# Patient Record
Sex: Male | Born: 1950 | Race: White | Hispanic: No | Marital: Married | Smoking: Current every day smoker
Health system: Southern US, Community
[De-identification: ages and names within clinical notes are randomized; demographics above are authoritative.]

## PROBLEM LIST (undated history)

## (undated) DIAGNOSIS — F419 Anxiety disorder, unspecified: Secondary | ICD-10-CM

## (undated) DIAGNOSIS — R269 Unspecified abnormalities of gait and mobility: Secondary | ICD-10-CM

## (undated) DIAGNOSIS — M8580 Other specified disorders of bone density and structure, unspecified site: Secondary | ICD-10-CM

## (undated) DIAGNOSIS — G90522 Complex regional pain syndrome I of left lower limb: Secondary | ICD-10-CM

## (undated) DIAGNOSIS — R413 Other amnesia: Secondary | ICD-10-CM

## (undated) DIAGNOSIS — J449 Chronic obstructive pulmonary disease, unspecified: Secondary | ICD-10-CM

## (undated) DIAGNOSIS — Z87442 Personal history of urinary calculi: Secondary | ICD-10-CM

## (undated) DIAGNOSIS — F1010 Alcohol abuse, uncomplicated: Secondary | ICD-10-CM

## (undated) DIAGNOSIS — I10 Essential (primary) hypertension: Secondary | ICD-10-CM

## (undated) DIAGNOSIS — M1990 Unspecified osteoarthritis, unspecified site: Secondary | ICD-10-CM

## (undated) DIAGNOSIS — S42301A Unspecified fracture of shaft of humerus, right arm, initial encounter for closed fracture: Secondary | ICD-10-CM

## (undated) DIAGNOSIS — R0600 Dyspnea, unspecified: Secondary | ICD-10-CM

## (undated) DIAGNOSIS — G629 Polyneuropathy, unspecified: Secondary | ICD-10-CM

## (undated) DIAGNOSIS — D132 Benign neoplasm of duodenum: Secondary | ICD-10-CM

## (undated) DIAGNOSIS — K859 Acute pancreatitis without necrosis or infection, unspecified: Secondary | ICD-10-CM

## (undated) DIAGNOSIS — G250 Essential tremor: Secondary | ICD-10-CM

## (undated) DIAGNOSIS — R569 Unspecified convulsions: Secondary | ICD-10-CM

## (undated) DIAGNOSIS — T4145XA Adverse effect of unspecified anesthetic, initial encounter: Secondary | ICD-10-CM

## (undated) HISTORY — PX: FOOT SURGERY: SHX648

## (undated) HISTORY — DX: Unspecified abnormalities of gait and mobility: R26.9

## (undated) HISTORY — PX: VASECTOMY: SHX75

## (undated) HISTORY — DX: Essential (primary) hypertension: I10

## (undated) HISTORY — PX: BUNIONECTOMY: SHX129

## (undated) HISTORY — DX: Complex regional pain syndrome i of left lower limb: G90.522

## (undated) HISTORY — PX: OTHER SURGICAL HISTORY: SHX169

## (undated) HISTORY — DX: Unspecified fracture of shaft of humerus, right arm, initial encounter for closed fracture: S42.301A

## (undated) HISTORY — DX: Other amnesia: R41.3

## (undated) HISTORY — PX: LUMBAR SPINE SURGERY: SHX701

## (undated) HISTORY — PX: SHOULDER SURGERY: SHX246

## (undated) HISTORY — DX: Polyneuropathy, unspecified: G62.9

## (undated) HISTORY — DX: Essential tremor: G25.0

## (undated) HISTORY — DX: Alcohol abuse, uncomplicated: F10.10

## (undated) HISTORY — PX: TONSILLECTOMY: SUR1361

## (undated) HISTORY — DX: Unspecified convulsions: R56.9

## (undated) HISTORY — DX: Other specified disorders of bone density and structure, unspecified site: M85.80

---

## 1981-07-11 DIAGNOSIS — R569 Unspecified convulsions: Secondary | ICD-10-CM

## 1981-07-11 HISTORY — DX: Unspecified convulsions: R56.9

## 1998-08-13 ENCOUNTER — Ambulatory Visit (HOSPITAL_COMMUNITY): Admission: RE | Admit: 1998-08-13 | Discharge: 1998-08-13 | Payer: Self-pay | Admitting: Gastroenterology

## 1999-11-15 ENCOUNTER — Other Ambulatory Visit: Admission: RE | Admit: 1999-11-15 | Discharge: 1999-11-15 | Payer: Self-pay

## 2000-04-26 ENCOUNTER — Encounter: Admission: RE | Admit: 2000-04-26 | Discharge: 2000-04-26 | Payer: Self-pay | Admitting: Orthopedic Surgery

## 2000-04-26 ENCOUNTER — Encounter: Payer: Self-pay | Admitting: Orthopedic Surgery

## 2001-08-07 ENCOUNTER — Encounter: Payer: Self-pay | Admitting: Emergency Medicine

## 2001-08-07 ENCOUNTER — Emergency Department (HOSPITAL_COMMUNITY): Admission: EM | Admit: 2001-08-07 | Discharge: 2001-08-07 | Payer: Self-pay | Admitting: Emergency Medicine

## 2002-12-02 ENCOUNTER — Encounter: Payer: Self-pay | Admitting: Emergency Medicine

## 2002-12-02 ENCOUNTER — Emergency Department (HOSPITAL_COMMUNITY): Admission: EM | Admit: 2002-12-02 | Discharge: 2002-12-02 | Payer: Self-pay | Admitting: Emergency Medicine

## 2004-04-12 ENCOUNTER — Ambulatory Visit (HOSPITAL_COMMUNITY): Admission: RE | Admit: 2004-04-12 | Discharge: 2004-04-12 | Payer: Self-pay | Admitting: Gastroenterology

## 2004-09-04 ENCOUNTER — Inpatient Hospital Stay (HOSPITAL_COMMUNITY): Admission: AC | Admit: 2004-09-04 | Discharge: 2004-09-06 | Payer: Self-pay | Admitting: Emergency Medicine

## 2004-09-17 ENCOUNTER — Ambulatory Visit (HOSPITAL_COMMUNITY): Admission: RE | Admit: 2004-09-17 | Discharge: 2004-09-18 | Payer: Self-pay | Admitting: Orthopedic Surgery

## 2004-10-26 ENCOUNTER — Ambulatory Visit: Payer: Self-pay | Admitting: Psychiatry

## 2004-10-26 ENCOUNTER — Other Ambulatory Visit (HOSPITAL_COMMUNITY): Admission: RE | Admit: 2004-10-26 | Discharge: 2005-01-24 | Payer: Self-pay | Admitting: Psychiatry

## 2007-05-31 ENCOUNTER — Encounter: Admission: RE | Admit: 2007-05-31 | Discharge: 2007-05-31 | Payer: Self-pay | Admitting: Orthopedic Surgery

## 2007-07-18 ENCOUNTER — Ambulatory Visit (HOSPITAL_COMMUNITY): Admission: RE | Admit: 2007-07-18 | Discharge: 2007-07-18 | Payer: Self-pay | Admitting: Urology

## 2007-07-18 ENCOUNTER — Encounter (INDEPENDENT_AMBULATORY_CARE_PROVIDER_SITE_OTHER): Payer: Self-pay | Admitting: Urology

## 2009-05-01 ENCOUNTER — Ambulatory Visit: Payer: Self-pay | Admitting: Vascular Surgery

## 2009-06-11 ENCOUNTER — Ambulatory Visit: Payer: Self-pay | Admitting: Vascular Surgery

## 2009-07-31 DIAGNOSIS — E785 Hyperlipidemia, unspecified: Secondary | ICD-10-CM | POA: Insufficient documentation

## 2009-07-31 DIAGNOSIS — Z8669 Personal history of other diseases of the nervous system and sense organs: Secondary | ICD-10-CM | POA: Insufficient documentation

## 2009-07-31 DIAGNOSIS — Z860100 Personal history of colon polyps, unspecified: Secondary | ICD-10-CM | POA: Insufficient documentation

## 2009-07-31 DIAGNOSIS — I10 Essential (primary) hypertension: Secondary | ICD-10-CM | POA: Insufficient documentation

## 2009-07-31 DIAGNOSIS — Z8719 Personal history of other diseases of the digestive system: Secondary | ICD-10-CM | POA: Insufficient documentation

## 2009-07-31 DIAGNOSIS — E7210 Disorders of sulfur-bearing amino-acid metabolism, unspecified: Secondary | ICD-10-CM | POA: Insufficient documentation

## 2009-07-31 DIAGNOSIS — Z87448 Personal history of other diseases of urinary system: Secondary | ICD-10-CM | POA: Insufficient documentation

## 2009-07-31 HISTORY — DX: Essential (primary) hypertension: I10

## 2009-08-03 ENCOUNTER — Ambulatory Visit: Payer: Self-pay | Admitting: Pulmonary Disease

## 2009-08-03 DIAGNOSIS — R05 Cough: Secondary | ICD-10-CM

## 2009-08-03 DIAGNOSIS — R059 Cough, unspecified: Secondary | ICD-10-CM | POA: Insufficient documentation

## 2009-08-03 DIAGNOSIS — J449 Chronic obstructive pulmonary disease, unspecified: Secondary | ICD-10-CM | POA: Insufficient documentation

## 2009-08-03 DIAGNOSIS — J4489 Other specified chronic obstructive pulmonary disease: Secondary | ICD-10-CM | POA: Insufficient documentation

## 2010-06-18 ENCOUNTER — Encounter
Admission: RE | Admit: 2010-06-18 | Discharge: 2010-06-18 | Payer: Self-pay | Source: Home / Self Care | Attending: Neurology | Admitting: Neurology

## 2010-06-18 ENCOUNTER — Encounter
Admission: RE | Admit: 2010-06-18 | Discharge: 2010-06-18 | Payer: Self-pay | Source: Home / Self Care | Attending: Physical Medicine and Rehabilitation | Admitting: Physical Medicine and Rehabilitation

## 2010-08-10 NOTE — Assessment & Plan Note (Signed)
Summary: chronic cough/apc   Visit Type:  Initial Consult Copy to:  Dr. Barbaraann Barthel Primary Provider/Referring Provider:  Dr. [name]  CC:  Pt here for pulmonary consult. Pt c/o productive cough intermittent starting September after travels to [place].  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 He has been coughing since his return from a trip to [place] in 9/10, onset with  a URI. Lisinopril was stopped in 05/27/09 & received azithro, prednisone dosepak in 12/10. CXR 12/10 was nml, PF was 385- 430 (post neb), CT chets 11/08 reviewed -nml. Spiromertry did not show evidence of ariflow obstruction. He reports constant post nasal drainage. This is mostly a wet cough, as against his constant / smoker's cough which is dry, with clear phlegm, worse in am but not much variation with time or exposure. Environmental history >> no pet alergens or  recent chnage in surroundings. Denies heartburn or childhood h/o asthma Also c/o chronic fatigue , snoring +, no witnessed apneas, denies excessive daytime somnolence    Preventive Screening-Counseling & Management  Alcohol-Tobacco     Alcohol drinks/day: 1     Alcohol type: all     Smoking Status: current     Packs/Day: 1.0     Year Started: 1965  Current Medications (verified): 1)  Dilantin 100 Mg Caps (Phenytoin Sodium Extended) .... 500mg  T, Th, Sat, Sun and 400mg  M, W, F 2)  Viagra 100 Mg Tabs (Sildenafil Citrate) .... Take 1 Tab By Mouth 30 Minutes Prior To Intercourse Once Daily As Needed 3)  Lipitor 10 Mg Tabs (Atorvastatin Calcium) .... Take 1 Tablet By Mouth Once A Day 4)  Metanx 3-35-2 Mg Tabs (L-Methylfolate-B6-B12) .... Take 1 Tablet By Mouth Once A Day 5)  Hyzaar 50-12.5 Mg Tabs (Losartan Potassium-Hctz) .... Take 1 Tablet By Mouth Once A Day 6)  Proventil Hfa 108 (90 Base) Mcg/act Aers (Albuterol Sulfate) .... 2 Puffs As Needed Every 4-6 Hours As Needed For Wheezing 7)  Advair  Diskus 100-50 Mcg/dose Aepb (Fluticasone-Salmeterol) .... Inhale 1 Puff Two Times A Day  Allergies (verified): 1)  ! Diovan 2)  ! 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  Smoking Status:  current Packs/Day:  1.0 Alcohol drinks/day:  1  Review of Systems       The patient complains of shortness of breath with activity, productive cough, non-productive cough, acid heartburn, indigestion, loss of appetite, tooth/dental problems, nasal congestion/difficulty breathing through nose, and sneezing.  The patient denies shortness of breath at rest, coughing up blood, chest pain, irregular heartbeats, weight change, abdominal pain, difficulty swallowing, sore throat, headaches, itching, ear ache, anxiety, depression, hand/feet swelling, joint stiffness or pain, rash, change in color of mucus, and fever.    Vital Signs:  Patient profile:   60 year old male Height:      68 inches Weight:      164.13 pounds BMI:     25.05 O2 Sat:      95 %  on Room air Temp:     98.2 degrees F oral Pulse rate:   88 / minute BP sitting:   150 / 82  (left arm) Cuff size:   regular  Vitals Entered By: Zackery Barefoot CMA (August 03, 2009 2:59 PM)  O2 Flow:  Room air CC: Pt here for pulmonary consult. Pt c/o productive cough intermittent starting September after travels to  [place] Comments Medications reviewed with patient Verified pt's contact number Zackery Barefoot CMA  August 03, 2009 2:59 PM    Physical Exam  Additional Exam:  Gen. Pleasant, well-nourished, in no distress, normal affect ENT - no lesions, no post nasal drip Neck: No JVD, no thyromegaly, no carotid bruits Lungs: no use of accessory muscles, no dullness to percussion, decreased  without rales or rhonchi  Cardiovascular: Rhythm regular, heart sounds  normal, no murmurs or gallops, no peripheral edema Abdomen: soft and non-tender, no hepatosplenomegaly, BS normal. Musculoskeletal: No deformities, no cyanosis or clubbing Neuro:  alert, non focal     Pulmonary Function Test Date: 08/03/2009 3:42 PM Gender: Male  Pre-Spirometry FVC    Value: 4.15 L/min   % Pred: 94.60 % FEV1    Value: 2.98 L     Pred: 3.32 L     % Pred: 89.70 % FEV1/FVC  Value: 71.77 %     % Pred: 94.80 %  Impression & Recommendations:  Problem # 1:  COUGH (ICD-786.2)  Use symbicort 160/4.5 2 puffs two times a day  - stop advair. Likley post bronchitic or related to post nasal drip - use flonase & claritin. If persistent, will Rx symptomatically with tessalon.  Orders: Consultation Level III (16109) Spirometry w/Graph (94010)  Problem # 2:  C O P D (ICD-496) Surprisingly , lung function is preserved but obviosuly smoking cessation paramount here. he is convinced of the need to quit but has reneged on his quit date several times.  Medications Added to Medication List This Visit: 1)  Advair Diskus 100-50 Mcg/dose Aepb (Fluticasone-salmeterol) .... Inhale 1 puff two times a day  Patient Instructions: 1)  Copy sent to: DrKevin  Little 2)  Please schedule a follow-up appointment in 3 months. 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

## 2010-08-11 HISTORY — PX: CERVICAL SPINE SURGERY: SHX589

## 2010-08-13 ENCOUNTER — Other Ambulatory Visit (HOSPITAL_COMMUNITY): Payer: Self-pay | Admitting: Neurosurgery

## 2010-08-13 ENCOUNTER — Other Ambulatory Visit: Payer: Self-pay | Admitting: Neurosurgery

## 2010-08-13 ENCOUNTER — Encounter (HOSPITAL_COMMUNITY): Payer: 59

## 2010-08-13 ENCOUNTER — Encounter (HOSPITAL_COMMUNITY)
Admission: RE | Admit: 2010-08-13 | Discharge: 2010-08-13 | Disposition: A | Discharge status: Home / Self Care | Payer: 59 | Source: Ambulatory Visit | Attending: Neurosurgery | Admitting: Neurosurgery

## 2010-08-13 DIAGNOSIS — R52 Pain, unspecified: Secondary | ICD-10-CM

## 2010-08-13 DIAGNOSIS — Z87891 Personal history of nicotine dependence: Secondary | ICD-10-CM | POA: Insufficient documentation

## 2010-08-13 DIAGNOSIS — Z01818 Encounter for other preprocedural examination: Secondary | ICD-10-CM | POA: Insufficient documentation

## 2010-08-13 DIAGNOSIS — I10 Essential (primary) hypertension: Secondary | ICD-10-CM | POA: Insufficient documentation

## 2010-08-13 LAB — SURGICAL PCR SCREEN
MRSA, PCR: NEGATIVE
Staphylococcus aureus: NEGATIVE

## 2010-08-13 LAB — BASIC METABOLIC PANEL
BUN: 8 mg/dL (ref 6–23)
CO2: 30 mEq/L (ref 19–32)
Calcium: 9.4 mg/dL (ref 8.4–10.5)
Chloride: 102 mEq/L (ref 96–112)
Creatinine, Ser: 0.86 mg/dL (ref 0.4–1.5)
GFR calc Af Amer: >60 mL/min (ref >60)
GFR calc non Af Amer: >60 mL/min (ref >60)
Glucose, Bld: 75 mg/dL (ref 70–99)
Potassium: 4 mEq/L (ref 3.5–5.1)
Sodium: 140 mEq/L (ref 135–145)

## 2010-08-13 LAB — CBC
HCT: 41.2 % (ref 39.0–52.0)
Hemoglobin: 14.1 g/dL (ref 13.0–17.0)
MCH: 36.4 pg — ABNORMAL HIGH (ref 26.0–34.0)
MCHC: 34.2 g/dL (ref 30.0–36.0)
MCV: 106.5 fL — ABNORMAL HIGH (ref 78.0–100.0)
Platelets: 294 10*3/uL (ref 150–400)
RBC: 3.87 MIL/uL — ABNORMAL LOW (ref 4.22–5.81)
RDW: 13.9 % (ref 11.5–15.5)
WBC: 8.9 10*3/uL (ref 4.0–10.5)

## 2010-08-18 ENCOUNTER — Observation Stay (HOSPITAL_COMMUNITY)
Admission: RE | Admit: 2010-08-18 | Discharge: 2010-08-19 | Disposition: A | Discharge status: Home / Self Care | Payer: 59 | Source: Ambulatory Visit | Attending: Neurosurgery | Admitting: Neurosurgery

## 2010-08-18 ENCOUNTER — Inpatient Hospital Stay (HOSPITAL_COMMUNITY): Payer: 59

## 2010-08-18 DIAGNOSIS — G40909 Epilepsy, unspecified, not intractable, without status epilepticus: Secondary | ICD-10-CM | POA: Insufficient documentation

## 2010-08-18 DIAGNOSIS — I10 Essential (primary) hypertension: Secondary | ICD-10-CM | POA: Insufficient documentation

## 2010-08-18 DIAGNOSIS — Z87891 Personal history of nicotine dependence: Secondary | ICD-10-CM | POA: Insufficient documentation

## 2010-08-18 DIAGNOSIS — J449 Chronic obstructive pulmonary disease, unspecified: Secondary | ICD-10-CM | POA: Insufficient documentation

## 2010-08-18 DIAGNOSIS — Z01818 Encounter for other preprocedural examination: Secondary | ICD-10-CM | POA: Insufficient documentation

## 2010-08-18 DIAGNOSIS — M4712 Other spondylosis with myelopathy, cervical region: Principal | ICD-10-CM | POA: Insufficient documentation

## 2010-08-18 DIAGNOSIS — G8929 Other chronic pain: Secondary | ICD-10-CM | POA: Insufficient documentation

## 2010-08-18 DIAGNOSIS — J4489 Other specified chronic obstructive pulmonary disease: Secondary | ICD-10-CM | POA: Insufficient documentation

## 2010-08-18 DIAGNOSIS — F17200 Nicotine dependence, unspecified, uncomplicated: Secondary | ICD-10-CM | POA: Insufficient documentation

## 2010-08-23 NOTE — Op Note (Signed)
NAME:  Jeffrey Castillo, Jeffrey Castillo NO.:  [id number]  MEDICAL RECORD NO.:  [id number]           PATIENT TYPE:  I  LOCATION:  3534                         FACILITY:  [facility]  PHYSICIAN:  Cristi Loron, M.D.DATE OF BIRTH:  [birth year]  DATE OF PROCEDURE:  08/18/2010 DATE OF DISCHARGE:                              OPERATIVE REPORT   BRIEF HISTORY:  The patient is a 60 year old white male who has suffered from symptoms consistent with a cervical myelopathy.  He has failed medical management and worked up with a cervical MRI which demonstrated he had significant stenosis at C5-6 and C6-7.  I discussed the various treatment options with the patient including surgery.  The patient has weighed the risks, benefits, and alternatives of surgery, decided to proceed with a C5-6 and C6-7 anterior cervical diskectomy, fusion, and plating.  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  PROCEDURE:  C5-6 and C6-7 extensive anterior cervical diskectomy/decompression; C5-6 and C6-C7 anterior interbody arthrodesis with local morselized autograft bone and Actifuse bone graft extender; insertion of interbody prosthesis at C5-6 and C6-7 (Novel PEEK interbody prosthesis); anterior cervical plating C5-7 with Skyline titanium plate and screws.  SURGEON:  Cristi Loron, MD  ASSISTANT:  Danae Orleans. Venetia Maxon, MD  ANESTHESIA:  General endotracheal.  ESTIMATED BLOOD LOSS:  100 mL.  SPECIMENS:  None.  DRAINS:  None.  COMPLICATIONS:  None.  DESCRIPTION OF PROCEDURE:  The patient was brought to the operating room by the anesthesia team.  General endotracheal anesthesia was induced. The patient remained in supine position.  A roll was placed under his shoulders to keep his neck in the neutral position.  The  anterior cervical region was then prepared with Betadine scrub and Betadine solution.  Sterile drapes were applied.  I then injected the area to be incised with Marcaine with epinephrine solution, a scalpel to make a transverse incision in the patient's left anterior neck.  I used the Metzenbaum scissors to divide the platysma muscle and then to dissect medial to sternocleidomastoid muscle, jugular vein, and carotid artery. I carefully dissected down towards the anterior cervical spine identifying the esophagus and retracting it medially.  We then used Kittner swabs to clear soft tissue from the anterior cervical spine.  We then inserted a bent spinal needle into the upper exposed intervertebral disk space.  We obtained intraoperative radiograph to firm our location. We then used electrocautery to detach the medial border of the longus colli muscle bilaterally from the C5-6 and C6-7 intervertebral disk space.  We then inserted the Caspar self-retaining retractor underneath the longus colli muscle bilaterally to provide exposure.  We began the decompression by incising C5-6 intervertebral disk with a 15-blade scalpel.  The disk space was quite spondylotic and collapsed. We inserted distraction screws into C5-C6, distracted the interspace, and then used the high-speed drill to decorticate the vertebral endplates at C5-6, drilled away the remainder of C5-6 intervertebral disk and to thin out the posterior longitudinal ligament.  We also drilled away some posterior spondylosis.  We then incised  the ligament with the arachnoid knife and then removed it with Kerrison punch undercutting the vertebral endplates of C5-6 decompressing the thecal sac.  We then performed foraminotomies about the bilateral C6 nerve root completing the decompression at this level.  We then repeated this procedure in an analogous fashion at C6-7, decompressing the thecal sac and bilateral C7 nerve roots.  Having  completed the decompression, we now turned attention to the arthrodesis.  We used the trial spacers and determined to use a 7-mm prosthesis at C6-7 and 6-mm prosthesis at C5-6.  We prefilled the prosthesis with combination of local morselized autograft bone.  We obtained decompression as well as Actifuse bone graft extender.  We inserted the prosthesis and distracted interspaces.  We then removed the distraction screws.  There was a good snug fit of the prosthesis in the interspace.  We now turned attention to the anterior spinal instrumentation.  We used the high-speed drill to remove some ventral spondylosis from the vertebral endplates at C5-6 and C6-7 so the plate would lay down flat. We selected the appropriate length Skyline titanium plate.  We laid it along the anterior aspect of the vertebral bodies at C5 from C5 to C7. We then drilled two 14-mm holes at C5, 6, and 7.  We then secured the plate to the vertebral bodies by placing two 40-mm self-tapping screws at C5, 6, and 7.  We got good bony purchase at each level.  We then obtained intraoperative radiograph which demonstrated good position of the instrumentation with limited visualization of lower plate screws but they looked good in vivo.  We therefore secured these screws and plate by locking each cam.  This completed the instrumentation.  We then obtained hemostasis using bipolar electrocautery.  We irrigated the wound out with bacitracin solution and then removed the retractor. We inspected the esophagus for any damage, there was none apparent.  We then reapproximated the patient's platysma muscle with interrupted 3-0 Vicryl suture, subcutaneous tissue with interrupted 3-0 Vicryl suture, and skin with Steri-Strips and Benzoin.  The wound was then coated with bacitracin ointment and sterile dressing were applied.  The drapes were removed.  The patient was subsequently extubated by the anesthesia team and transported to the  postanesthesia care unit in stable condition. All sponge, instrument, and needle counts were correct at the end of this case.     Cristi Loron, M.D.     JDJ/MEDQ  D:  08/18/2010  T:  08/19/2010  Job:  [id number]  Electronically Signed by Tressie Stalker M.D. on 08/23/2010 10:52:22 AM

## 2010-09-23 ENCOUNTER — Other Ambulatory Visit: Payer: Self-pay | Admitting: Neurology

## 2010-09-23 DIAGNOSIS — Z79899 Other long term (current) drug therapy: Secondary | ICD-10-CM

## 2010-09-30 ENCOUNTER — Other Ambulatory Visit: Payer: 59

## 2010-11-23 NOTE — Op Note (Signed)
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   POSTOPERATIVE DIAGNOSIS:  Possible bladder cancer left posterior bladder  wall.   OPERATION:  TUR bladder tumor (2.5 cm) left posterior bladder wall.   ANESTHESIA:  General.   SURGEON:  Courtney Paris, M.D.   BRIEF HISTORY:  This 60 year old smoker has had some right back pain  November 2008.  He has had some previous stones and also microscopic  hematuria.  Negative cysto June 2005.  He did have some atypical cells  on cytology so cysto was done which showed a lesion that was  inflammatory just medial to the left ureteral orifice on the left  posterior bladder wall.  He enters to have this biopsied at this time.   PROCEDURE IN DETAIL:  The patient was placed on the operating table in  the dorsal lithotomy position.  After satisfactory induction of general  anesthesia he was prepped and draped with Betadine in the usual sterile  fashion and given IV antibiotics.  The 22 panendoscope was used.  The  anterior urethra was not strictured.  Posterior urethra was  nonobstructed.  The bladder was entered.  The lesion on the left  posterior bladder wall near the left orifice was photographed and just  medial to the left orifice.  It was somewhat inflammatory in nature and  kind of cobblestoned and fairly sessile.  After photograph was made the  lesion was completely removed using the biopsy forceps.  Three good  bites of tissue being made.  The defect was then fulgurated with the  Bugbee electrode.  The area resected was about 2.5 cm.  There was  adequate  hemostasis.  The bladder drained, scope removed and the patient  taken to recovery room in good condition for later discharge as an  outpatient.  He will be called regarding the biopsy report when  available.      Courtney Paris, M.D.  Electronically Signed     HMK/MEDQ  D:  07/18/2007  T:  07/18/2007  Job:  [id number]

## 2010-11-23 NOTE — Consult Note (Signed)
VASCULAR SURGERY CONSULTATION   Jeffrey Castillo, Jeffrey Castillo  DOB:  August 22, 1950                                       06/11/2009  HYQMV#:[id number]   I saw the patient in the office today in consultation concerning his  peripheral vascular disease.  He was referred by Dr. Donnie Aho.  This is a  pleasant 60 year old gentleman who was undergoing a routine treadmill  test by Dr. Donnie Aho.  On his exam he was noted to have a right femoral  bruit which was new and therefore he was sent for a Doppler study and  vascular evaluation.  The patient states that he has had a 2 year  history of right hip pain which is brought on by ambulation and relieved  with rest.  He has had no significant calf or thigh claudication.  He  has had no symptoms in the left leg.  His symptoms have been stable over  the last 2 years.  There are no other aggravating or alleviating factors  associated with this pain.  He has had no rest pain and no history of  nonhealing ulcers in his feet.   PAST MEDICAL HISTORY:  Significant for hypertension which is stable on  his current medications.  In addition, he has had a history of seizures  in the past and he had his most recent seizure in 2006 at which time he  was started on Dilantin.  He has had no further seizures since that  time.   He denies any history of diabetes, hypercholesterolemia, previous  history of myocardial infarction, history of congestive heart failure or  history of COPD.   FAMILY HISTORY:  His brother died with a heart attack at age 43.  He had  an uncle who died in his 7s with a myocardial infarction.  He is  unaware of any other history of premature cardiovascular disease.   SOCIAL HISTORY:  He is married.  He has 5 children.  He works as a  Production designer, theatre/television/film at [employer].  He smokes a pack per day of  cigarettes and has been smoking for over 40 years.  He has 2-3 drinks a  day.   MEDICATIONS:  Include Dilantin, Lipitor, Metanx,  Cozaar, Viagra,  Flonase, Astepro, loratadine, amoxicillin.   ALLERGIES:  Are sulfa.   REVIEW OF SYSTEMS:  CARDIOVASCULAR:  He has had no chest pain, chest  pressure, palpitations or arrhythmias.  He has had no orthopnea or  significant dyspnea on exertion.  He has had claudication in the right  hip with no rest pain or nonhealing ulcers.  He has had no history of  stroke, TIAs, expressive or receptive aphasia or amaurosis fugax.  He  has had no history of DVT or phlebitis.  NEUROLOGIC:  He has had seizures in the past but none recently.  He has  had no dizziness, blackouts or headaches.  MUSCULOSKELETAL:  He does have the right hip pain and possibly some  arthritis here.  General, pulmonary, GI, GU, psychiatric, ENT, hematologic review of  systems is unremarkable and is documented on the medical history form in  his chart.   PHYSICAL EXAMINATION:  General:  This is a pleasant 60 year old  gentleman who appears his stated age.  Vital signs:  His blood pressure  is 184/89, heart rate is 80, respiratory rate 16.  HEENT:  The pupils  are equal, round, reactive to light and accommodation.  Extraocular  motions are intact.  Conjunctivae are normal.  Lungs:  Are clear  bilaterally to auscultation without rales, rhonchi or wheezing.  Cardiovascular:  I do not detect any carotid bruits.  He has a regular  rate and rhythm without murmur or gallop appreciated.  He has no  peripheral edema.  He has palpable femoral, popliteal, dorsalis pedis  and posterior tibial pulses bilaterally.  He does have a soft right  femoral bruit.  He has a relatively normal right femoral pulse.  Abdomen:  Soft and nontender.  I cannot appreciate an aneurysm.  He has  no masses appreciated.  He has normal pitched bowel sounds.  Musculoskeletal:  He has no major deformities or cyanosis.  Neurologic:  He has no focal weakness or paresthesias.  Lymphatic:  He has no  significant cervical, axillary or inguinal  lymphadenopathy.   I did independently interpret his arterial Doppler study which showed  biphasic Doppler signals in the right foot in the posterior tibial and  the anterior tibial position and biphasic Doppler signals on the left.  ABI on the right was 88% and on the left 100%.   Based on his exam I suspect that he may have some stenosis in the right  common femoral artery although he has a fairly normal pulse here and an  ABI of 88% on the right which is very reasonable.  I think the disease  is most likely related to the femoral position and less likely related  to proximal iliac disease.  In addition, I am not totally convinced that  all of his right hip symptoms are related to his peripheral vascular  disease and he may have some arthritis in the hip.  He oftentimes gets  pain in the hip with prolonged standing which could be potentially  related to arthritic disease.  I have explained I would not recommend  arteriography unless his symptoms progressed or he developed significant  rest pain.  We have discussed the importance of tobacco cessation and I  have also encouraged him to get on a structured walking program.  I  would be happy to see him back at any time if his symptoms progress and  certainly we could reconsider arteriography.  However, at this point he  has a fairly strong femoral pulse with only a marginally decreased ABI  on the right and his symptoms are quite tolerable so I do not recommend  an aggressive approach at this point.   Di Kindle. Edilia Bo, M.D.  Electronically Signed  CSD/MEDQ  D:  06/11/2009  T:  06/12/2009  Job:  [id number]   cc:   Caryn Bee L. Little, M.D.  Georga Hacking, M.D.

## 2010-11-26 NOTE — Op Note (Signed)
NAME:  Jeffrey Castillo, Jeffrey Castillo NO.:  [id number]   MEDICAL RECORD NO.:  [id number]          PATIENT TYPE:  AMB   LOCATION:  ENDO                         [facility]AC[facility]   PHYSICIAN:  John C. Madilyn Fireman, M.D.    DATE OF BIRTH:  [birth year]   DATE OF PROCEDURE:  04/12/2004  DATE OF DISCHARGE:                                 OPERATIVE REPORT   PROCEDURE:  Colonoscopy with polypectomy.   INDICATIONS FOR PROCEDURE:  Family history of colon cancer in a first-degree  relative.   PROCEDURE:  The patient was placed in the left lateral decubitus position  and placed on the pulse monitor with continuous low flow oxygen delivered by  nasal cannula.  He was sedated with 100 mcg IV fentanyl and 10 mg IV Versed.  The Olympus video colonoscope is inserted into the rectum and advanced to  the cecum, confirmed by transillumination at McBurney's point and  visualization of the ileocecal valve and appendiceal orifice.  Prep was  excellent.  At the base of the cecum near the appendiceal orifice, there was  a small 6 mm polyp that was fulgurated by hot biopsy.  The remainder of the  cecum, ascending, transverse, and descending colon all appeared normal with  no further polyps, masses, diverticula, or other mucosal abnormalities.  In  the sigmoid colon, there was seen an 8 mm polyp that was fulgurated by hot  biopsy and sent in the same specimen container as the first polyp.  The  remainder of the rectum appeared normal.  There were a few sigmoid  diverticula.  The scope was then withdrawn, and the patient returned to the  recovery room in stable condition.  He tolerated the procedure well, and  there were no immediate complications.   IMPRESSION:  1.  Cecal and rectal polyps.  2.  Left-sided diverticulosis.   PLAN:  Await histology.  Will probably repeat colonoscopy in five years.      JCH/MEDQ  D:  04/12/2004  T:  04/12/2004  Job:  [id number]   cc:   Caryn Bee L. Little, M.D.  [address]  [place]  [place] [address]  Fax: [phone]
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
## 2010-11-26 NOTE — Op Note (Signed)
NAME:  Jeffrey Castillo, Jeffrey Castillo NO.:  [id number]   MEDICAL RECORD NO.:  [id number]          PATIENT TYPE:  OIB   LOCATION:  5015                         FACILITY:  [facility]   PHYSICIAN:  Burnard Bunting, M.D.    DATE OF BIRTH:  18-Aug-1950   DATE OF PROCEDURE:  09/17/2004  DATE OF DISCHARGE:  09/18/2004                                 OPERATIVE REPORT   PREOPERATIVE DIAGNOSIS:  Right shoulder greater tuberosity fracture, rotator  cuff tear and bursitis.   POSTOPERATIVE DIAGNOSIS:  Right shoulder greater tuberosity fracture,  rotator cuff tear and bursitis.   OPERATION PERFORMED:  Right shoulder open reduction internal fixation of  greater tuberosity fracture with subsequent rotator cuff tendon repair and  subacromial decompression.   SURGEON:  Burnard Bunting, M.D.   ASSISTANT:  Jerolyn Shin. Tresa Res, M.D.   ANESTHESIA:  General endotracheal.   ESTIMATED BLOOD LOSS:  25 cc.   DRAINS:  None.   DESCRIPTION OF PROCEDURE:  The patient was brought to the operating room  where general endotracheal anesthesia was induced.  Preoperative antibiotics  were administered.  The right shoulder was prepped with DuraPrep solution  and draped in sterile manner.  Collier Flowers was used to cover the operative field.  An incision was made off the anterolateral margin of the acromion.  Skin and  subcutaneous tissue were sharply divided.  Deltoid was split a measured  distance of 4 cm with tagging suture placed at the apex of the split.  The  axillary nerve was palpated, protected at all times during the case.  Partial deltoid detachment was required for visualization of the fracture.  Self-retaining retractor was placed.  Fracture visualization was noted.  The  patient had a comminuted fracture of the greater tuberosity with split in  the rotator cuff extending proximally.  Rotator cuff tear was repaired using  nonabsorbable 0 Tycron placed in simple fashion.  The bony fragments with  the infraspinatus  attachment of the rotator cuff was mobilized.  Bony bed  was prepared.  The fragments were then repaired back to their bony beds  using #2 figure-of-eight FiberWire placed with assistance of a __________  device distally and placed through the tendon bone junction proximally.  This was supplemented with bone to bone fixation through drill holes on the  side aspects of the greater tuberosity.  Following placement of the  fragments back into their beds, the arm was taken through a range of motion  and found to be stable.  Subacromial decompression and bursectomy was  performed in order to enlarge the subacromial space for range of motion.  The patient did have a small spur off the acromion.  At this time the  incision was thoroughly irrigated.  The deltoid split was closed.  Reattachment was performed using  one drill hole through the acromion and #2 FiberWire suture.  Secure  fixation was achieved with the deltoid repair.  Skin was closed using  interrupted inverted 2-0 Vicryl suture and running 3-0 pullout Prolene.  The  patient was placed in a bulky dressing and shoulder sling.  The patient  tolerated the procedure well without any complication.      GSD/MEDQ  D:  10/28/2004  T:  10/29/2004  Job:  [id number]
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
## 2010-12-26 ENCOUNTER — Emergency Department (HOSPITAL_COMMUNITY): Payer: 59

## 2010-12-26 ENCOUNTER — Emergency Department (HOSPITAL_COMMUNITY)
Admission: EM | Admit: 2010-12-26 | Discharge: 2010-12-27 | Disposition: A | Discharge status: Home / Self Care | Payer: 59 | Attending: General Surgery | Admitting: General Surgery

## 2010-12-26 ENCOUNTER — Encounter (HOSPITAL_COMMUNITY): Payer: Self-pay | Admitting: Radiology

## 2010-12-26 DIAGNOSIS — W01119A Fall on same level from slipping, tripping and stumbling with subsequent striking against unspecified sharp object, initial encounter: Secondary | ICD-10-CM | POA: Insufficient documentation

## 2010-12-26 DIAGNOSIS — M549 Dorsalgia, unspecified: Secondary | ICD-10-CM | POA: Insufficient documentation

## 2010-12-26 DIAGNOSIS — R109 Unspecified abdominal pain: Secondary | ICD-10-CM | POA: Insufficient documentation

## 2010-12-26 DIAGNOSIS — Z79899 Other long term (current) drug therapy: Secondary | ICD-10-CM | POA: Insufficient documentation

## 2010-12-26 DIAGNOSIS — Y92009 Unspecified place in unspecified non-institutional (private) residence as the place of occurrence of the external cause: Secondary | ICD-10-CM | POA: Insufficient documentation

## 2010-12-26 DIAGNOSIS — W268XXA Contact with other sharp object(s), not elsewhere classified, initial encounter: Secondary | ICD-10-CM | POA: Insufficient documentation

## 2010-12-26 DIAGNOSIS — G40909 Epilepsy, unspecified, not intractable, without status epilepticus: Secondary | ICD-10-CM | POA: Insufficient documentation

## 2010-12-26 DIAGNOSIS — E785 Hyperlipidemia, unspecified: Secondary | ICD-10-CM | POA: Insufficient documentation

## 2010-12-26 DIAGNOSIS — I10 Essential (primary) hypertension: Secondary | ICD-10-CM | POA: Insufficient documentation

## 2010-12-26 DIAGNOSIS — S21209A Unspecified open wound of unspecified back wall of thorax without penetration into thoracic cavity, initial encounter: Secondary | ICD-10-CM | POA: Insufficient documentation

## 2010-12-26 LAB — POCT I-STAT, CHEM 8
BUN: 5 mg/dL — ABNORMAL LOW (ref 6–23)
Calcium, Ion: 0.97 mmol/L — ABNORMAL LOW (ref 1.12–1.32)
Chloride: 95 meq/L — ABNORMAL LOW (ref 96–112)
Creatinine, Ser: 1.2 mg/dL (ref 0.50–1.35)
Glucose, Bld: 90 mg/dL (ref 70–99)
HCT: 44 % (ref 39.0–52.0)
Hemoglobin: 15 g/dL (ref 13.0–17.0)
Potassium: 3.3 meq/L — ABNORMAL LOW (ref 3.5–5.1)
Sodium: 136 meq/L (ref 135–145)
TCO2: 24 mmol/L (ref 0–100)

## 2010-12-26 LAB — COMPREHENSIVE METABOLIC PANEL
ALT: 111 U/L — ABNORMAL HIGH (ref 0–53)
AST: 174 U/L — ABNORMAL HIGH (ref 0–37)
Albumin: 4.1 g/dL (ref 3.5–5.2)
Alkaline Phosphatase: 85 U/L (ref 39–117)
BUN: 7 mg/dL (ref 6–23)
CO2: 27 meq/L (ref 19–32)
Calcium: 9.1 mg/dL (ref 8.4–10.5)
Chloride: 92 meq/L — ABNORMAL LOW (ref 96–112)
Creatinine, Ser: 0.75 mg/dL (ref 0.50–1.35)
GFR calc Af Amer: >60 mL/min (ref >60)
GFR calc non Af Amer: >60 mL/min (ref >60)
Glucose, Bld: 89 mg/dL (ref 70–99)
Potassium: 3.2 meq/L — ABNORMAL LOW (ref 3.5–5.1)
Sodium: 137 meq/L (ref 135–145)
Total Bilirubin: 0.2 mg/dL — ABNORMAL LOW (ref 0.3–1.2)
Total Protein: 6.9 g/dL (ref 6.0–8.3)

## 2010-12-26 LAB — CBC
HCT: 40.2 % (ref 39.0–52.0)
Hemoglobin: 14.4 g/dL (ref 13.0–17.0)
MCH: 35.3 pg — ABNORMAL HIGH (ref 26.0–34.0)
MCHC: 35.8 g/dL (ref 30.0–36.0)
MCV: 98.5 fL (ref 78.0–100.0)
Platelets: 201 10*3/uL (ref 150–400)
RBC: 4.08 MIL/uL — ABNORMAL LOW (ref 4.22–5.81)
RDW: 15.2 % (ref 11.5–15.5)
WBC: 5.4 10*3/uL (ref 4.0–10.5)

## 2010-12-26 LAB — LACTIC ACID, PLASMA: Lactic Acid, Venous: 3.9 mmol/L — ABNORMAL HIGH (ref 0.5–2.2)

## 2010-12-26 LAB — PROTIME-INR
INR: 0.8 (ref 0.00–1.49)
Prothrombin Time: 11.3 s — ABNORMAL LOW (ref 11.6–15.2)

## 2010-12-26 LAB — TYPE AND SCREEN
Unit division: 0
Unit division: 0

## 2010-12-26 MED ORDER — IOHEXOL 300 MG/ML  SOLN
100.0000 mL | Freq: Once | INTRAMUSCULAR | Status: AC | PRN
  Administered 2010-12-26: 100 mL via INTRAVENOUS

## 2010-12-27 LAB — URINALYSIS, ROUTINE W REFLEX MICROSCOPIC
Bilirubin Urine: NEGATIVE
Glucose, UA: NEGATIVE mg/dL
Hgb urine dipstick: NEGATIVE
Ketones, ur: 15 mg/dL — AB
Leukocytes, UA: NEGATIVE
Nitrite: NEGATIVE
Protein, ur: NEGATIVE mg/dL
Specific Gravity, Urine: 1.011 (ref 1.005–1.030)
Urobilinogen, UA: 0.2 mg/dL (ref 0.0–1.0)
pH: 6 (ref 5.0–8.0)

## 2011-01-27 ENCOUNTER — Ambulatory Visit: Payer: Self-pay | Admitting: Pain Medicine

## 2011-02-09 HISTORY — PX: SPINAL CORD STIMULATOR IMPLANT: SHX2422

## 2011-02-28 ENCOUNTER — Ambulatory Visit: Payer: Self-pay | Admitting: Pain Medicine

## 2011-02-28 ENCOUNTER — Other Ambulatory Visit: Payer: Self-pay | Admitting: Pain Medicine

## 2011-03-04 ENCOUNTER — Ambulatory Visit: Payer: Self-pay | Admitting: Pain Medicine

## 2011-03-15 ENCOUNTER — Ambulatory Visit: Payer: Self-pay | Admitting: Pain Medicine

## 2011-03-31 LAB — CBC
HCT: 40.2
Hemoglobin: 13.8
MCHC: 34.3
MCV: 93.3
Platelets: 282
RBC: 4.31
RDW: 13.6
WBC: 6.6

## 2011-03-31 LAB — BASIC METABOLIC PANEL
BUN: 12
CO2: 27
Calcium: 8.7
Chloride: 108
Creatinine, Ser: 0.83
GFR calc Af Amer: >60
GFR calc non Af Amer: >60
Glucose, Bld: 92
Potassium: 4.1
Sodium: 139

## 2011-03-31 LAB — URINALYSIS, ROUTINE W REFLEX MICROSCOPIC
Bilirubin Urine: NEGATIVE
Glucose, UA: NEGATIVE
Hgb urine dipstick: NEGATIVE
Ketones, ur: NEGATIVE
Nitrite: NEGATIVE
Protein, ur: NEGATIVE
Specific Gravity, Urine: 1.005
Urobilinogen, UA: 0.2
pH: 7

## 2011-04-13 ENCOUNTER — Ambulatory Visit: Payer: Self-pay | Admitting: Pain Medicine

## 2011-05-12 ENCOUNTER — Other Ambulatory Visit: Payer: Self-pay | Admitting: Physical Medicine and Rehabilitation

## 2011-05-12 DIAGNOSIS — L819 Disorder of pigmentation, unspecified: Secondary | ICD-10-CM

## 2011-05-16 ENCOUNTER — Ambulatory Visit
Admission: RE | Admit: 2011-05-16 | Discharge: 2011-05-16 | Disposition: A | Discharge status: Home / Self Care | Payer: 59 | Source: Ambulatory Visit | Attending: Physical Medicine and Rehabilitation | Admitting: Physical Medicine and Rehabilitation

## 2011-05-16 DIAGNOSIS — L819 Disorder of pigmentation, unspecified: Secondary | ICD-10-CM

## 2012-05-11 DIAGNOSIS — G40309 Generalized idiopathic epilepsy and epileptic syndromes, not intractable, without status epilepticus: Secondary | ICD-10-CM | POA: Diagnosis not present

## 2012-05-11 DIAGNOSIS — G609 Hereditary and idiopathic neuropathy, unspecified: Secondary | ICD-10-CM | POA: Diagnosis not present

## 2012-09-11 DIAGNOSIS — R7401 Elevation of levels of liver transaminase levels: Secondary | ICD-10-CM | POA: Insufficient documentation

## 2012-09-11 DIAGNOSIS — G40309 Generalized idiopathic epilepsy and epileptic syndromes, not intractable, without status epilepticus: Secondary | ICD-10-CM | POA: Diagnosis not present

## 2012-09-11 DIAGNOSIS — R7402 Elevation of levels of lactic acid dehydrogenase (LDH): Secondary | ICD-10-CM | POA: Insufficient documentation

## 2012-09-11 DIAGNOSIS — R32 Unspecified urinary incontinence: Secondary | ICD-10-CM | POA: Insufficient documentation

## 2012-09-11 DIAGNOSIS — R892 Abnormal level of other drugs, medicaments and biological substances in specimens from other organs, systems and tissues: Secondary | ICD-10-CM | POA: Insufficient documentation

## 2012-09-11 DIAGNOSIS — G609 Hereditary and idiopathic neuropathy, unspecified: Secondary | ICD-10-CM | POA: Insufficient documentation

## 2012-09-11 DIAGNOSIS — M4802 Spinal stenosis, cervical region: Secondary | ICD-10-CM | POA: Insufficient documentation

## 2012-09-11 DIAGNOSIS — R269 Unspecified abnormalities of gait and mobility: Secondary | ICD-10-CM | POA: Insufficient documentation

## 2012-09-11 DIAGNOSIS — Z5181 Encounter for therapeutic drug level monitoring: Secondary | ICD-10-CM | POA: Insufficient documentation

## 2012-09-11 DIAGNOSIS — IMO0002 Reserved for concepts with insufficient information to code with codable children: Secondary | ICD-10-CM | POA: Insufficient documentation

## 2012-11-15 ENCOUNTER — Other Ambulatory Visit: Payer: Self-pay

## 2012-11-15 MED ORDER — LAMOTRIGINE 25 MG PO TABS: 50.0000 mg | ORAL_TABLET | Freq: Two times a day (BID) | ORAL | Status: DC

## 2012-11-15 NOTE — Telephone Encounter (Signed)
Former Love patient assigned to Dr Willis  

## 2012-12-06 ENCOUNTER — Other Ambulatory Visit: Payer: Self-pay | Admitting: Physical Medicine and Rehabilitation

## 2012-12-06 DIAGNOSIS — G90529 Complex regional pain syndrome I of unspecified lower limb: Secondary | ICD-10-CM | POA: Diagnosis not present

## 2012-12-06 DIAGNOSIS — IMO0002 Reserved for concepts with insufficient information to code with codable children: Secondary | ICD-10-CM | POA: Diagnosis not present

## 2012-12-06 DIAGNOSIS — M545 Low back pain, unspecified: Secondary | ICD-10-CM | POA: Diagnosis not present

## 2012-12-06 DIAGNOSIS — M961 Postlaminectomy syndrome, not elsewhere classified: Secondary | ICD-10-CM | POA: Diagnosis not present

## 2012-12-06 DIAGNOSIS — M79605 Pain in left leg: Secondary | ICD-10-CM

## 2012-12-06 DIAGNOSIS — G894 Chronic pain syndrome: Secondary | ICD-10-CM | POA: Diagnosis not present

## 2012-12-07 ENCOUNTER — Other Ambulatory Visit: Payer: Self-pay

## 2012-12-10 ENCOUNTER — Ambulatory Visit
Admission: RE | Admit: 2012-12-10 | Discharge: 2012-12-10 | Disposition: A | Discharge status: Home / Self Care | Payer: 59 | Source: Ambulatory Visit | Attending: Physical Medicine and Rehabilitation | Admitting: Physical Medicine and Rehabilitation

## 2012-12-10 DIAGNOSIS — M79605 Pain in left leg: Secondary | ICD-10-CM

## 2013-01-30 ENCOUNTER — Encounter: Payer: Self-pay | Admitting: Neurology

## 2013-01-30 DIAGNOSIS — M4802 Spinal stenosis, cervical region: Secondary | ICD-10-CM

## 2013-01-30 DIAGNOSIS — Z5181 Encounter for therapeutic drug level monitoring: Secondary | ICD-10-CM

## 2013-01-30 DIAGNOSIS — R892 Abnormal level of other drugs, medicaments and biological substances in specimens from other organs, systems and tissues: Secondary | ICD-10-CM

## 2013-01-30 DIAGNOSIS — R7401 Elevation of levels of liver transaminase levels: Secondary | ICD-10-CM

## 2013-01-30 DIAGNOSIS — G609 Hereditary and idiopathic neuropathy, unspecified: Secondary | ICD-10-CM

## 2013-01-30 DIAGNOSIS — R7402 Elevation of levels of lactic acid dehydrogenase (LDH): Secondary | ICD-10-CM

## 2013-01-30 DIAGNOSIS — IMO0002 Reserved for concepts with insufficient information to code with codable children: Secondary | ICD-10-CM

## 2013-01-30 DIAGNOSIS — G40309 Generalized idiopathic epilepsy and epileptic syndromes, not intractable, without status epilepticus: Secondary | ICD-10-CM

## 2013-01-30 DIAGNOSIS — R269 Unspecified abnormalities of gait and mobility: Secondary | ICD-10-CM

## 2013-01-30 DIAGNOSIS — R413 Other amnesia: Secondary | ICD-10-CM | POA: Insufficient documentation

## 2013-01-30 DIAGNOSIS — R32 Unspecified urinary incontinence: Secondary | ICD-10-CM

## 2013-01-31 ENCOUNTER — Encounter: Payer: Self-pay | Admitting: Neurology

## 2013-01-31 ENCOUNTER — Ambulatory Visit (INDEPENDENT_AMBULATORY_CARE_PROVIDER_SITE_OTHER): Payer: 59 | Admitting: Neurology

## 2013-01-31 VITALS — BP 129/79 | HR 84 | Ht 67.0 in | Wt 154.0 lb

## 2013-01-31 DIAGNOSIS — D518 Other vitamin B12 deficiency anemias: Secondary | ICD-10-CM

## 2013-01-31 DIAGNOSIS — R269 Unspecified abnormalities of gait and mobility: Secondary | ICD-10-CM

## 2013-01-31 DIAGNOSIS — R6889 Other general symptoms and signs: Secondary | ICD-10-CM

## 2013-01-31 DIAGNOSIS — Z8669 Personal history of other diseases of the nervous system and sense organs: Secondary | ICD-10-CM
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
## 2013-02-06 ENCOUNTER — Other Ambulatory Visit: Payer: Self-pay

## 2013-02-06 ENCOUNTER — Ambulatory Visit
Admission: RE | Admit: 2013-02-06 | Discharge: 2013-02-06 | Disposition: A | Discharge status: Home / Self Care | Payer: 59 | Source: Ambulatory Visit | Attending: Neurology | Admitting: Neurology

## 2013-02-06 ENCOUNTER — Telehealth: Payer: Self-pay | Admitting: Neurology

## 2013-02-06 DIAGNOSIS — R269 Unspecified abnormalities of gait and mobility: Secondary | ICD-10-CM

## 2013-02-06 MED ORDER — LAMOTRIGINE 25 MG PO TABS: 50.0000 mg | ORAL_TABLET | Freq: Two times a day (BID) | ORAL | Status: DC

## 2013-02-06 MED ORDER — LEVETIRACETAM 500 MG PO TABS: 500.0000 mg | ORAL_TABLET | Freq: Two times a day (BID) | ORAL | Status: DC

## 2013-02-06 NOTE — Telephone Encounter (Signed)
I called patient. The CT of the cervical spine was unremarkable, without spinal cord compression. The CT does not show a reason for the gait disorder. Blood work has not yet been done.

## 2013-04-01 ENCOUNTER — Telehealth: Payer: Self-pay | Admitting: Neurology

## 2013-04-02 ENCOUNTER — Telehealth: Payer: Self-pay | Admitting: Neurology

## 2013-04-02 NOTE — Telephone Encounter (Signed)
I called the patient. His PT sent a letter indicating that his walking is much worse. I will get a RV.

## 2013-04-03 NOTE — Telephone Encounter (Signed)
Gait has gotten a bit better. Gait and balance are way off. Pain in legs and left foot. Pain feels like cold, fire, tingling or sharp. He has nerve stimulator but does not help. Able to walk short distances with walker. He is also not sleeping well at night. Only had one good night sleep in two weeks. Patient is on Keppra and lamictal as well. Wife is wondering if an antidepressant can be ordered because she feels that he is quite depressed and needs pharmaceutical intervention.

## 2013-04-16 ENCOUNTER — Encounter: Payer: Self-pay | Admitting: Neurology

## 2013-04-16 ENCOUNTER — Ambulatory Visit (INDEPENDENT_AMBULATORY_CARE_PROVIDER_SITE_OTHER): Payer: 59 | Admitting: Neurology

## 2013-04-16 VITALS — BP 135/85 | HR 98 | Wt 152.0 lb

## 2013-04-16 DIAGNOSIS — R269 Unspecified abnormalities of gait and mobility: Secondary | ICD-10-CM

## 2013-04-16 DIAGNOSIS — Z8669 Personal history of other diseases of the nervous system and sense organs: Secondary | ICD-10-CM

## 2013-04-16 NOTE — Progress Notes (Signed)
Reason for visit: Gait disorder  Jeffrey Castillo is an 62 y.o. male  History of present illness:  Jeffrey Castillo is a 62 year old right-handed white male with a history of a gait disorder. The patient is also followed for seizures. The patient has a history of a benign essential tremor as well. The patient was noted to have significant balance issues when he was seen 6 months ago. The patient had been on Dilantin for his seizures on a chronic basis. The patient was taken off of this medication, and he was switched to Keppra. The patient however, has continued to drink a significant amounts of alcohol daily, and his wife indicates that he drinks almost 1/5 of liquor daily. The patient denies any numbness in the feet with exception of some occasional tingling in the left foot only. The patient has no burning or stinging in the feet. The patient does have tremors affecting all 4 extremities, and some resting tremor of the left arm. The patient has been seen through physical therapy, and his therapist has been concerned that his walking has deteriorated significantly over the last several months. The patient uses a cane for ambulation, and he will occasionally use a walker. The patient returns to this office for an evaluation. The patient has undergone a recent CT scan of the cervical spine, and this did not show spinal cord compression.   Past Medical History  Diagnosis Date  . HYPERTENSION 07/31/2009  . Seizures   . Degenerative joint disease of low back   . Gait disorder   . Neuropathy   . Complex regional pain syndrome of left lower extremity   . Osteopenia   . Memory disorder   . Right humeral fracture   . Essential tremor   . Alcohol abuse     Past Surgical History  Procedure Laterality Date  . Foot surgery      2 occasions  . Shoulder surgery Right   . Vasectomy    . Bladder leision    . Bunionectomy    . Lumbar spine surgery      2 occasions for left L5 radiculopathy  . Cervical  spine surgery  08/2010    C5-6-7  . Spinal cord stimulator implant  02/2011    Family History  Problem Relation Age of Onset  . Colon cancer Father   . Heart disease Brother     Social history:  reports that he has been smoking Cigarettes.  He has been smoking about 1.00 pack per day. He does not have any smokeless tobacco history on file. He reports that  drinks alcohol. He reports that he does not use illicit drugs.    Allergies  Allergen Reactions  . Sulfonamide Derivatives     REACTION: unknown as a child  . Valsartan     REACTION: lightheaded    Medications:  Current Outpatient Prescriptions on File Prior to Visit  Medication Sig Dispense Refill  . cloNIDine (CATAPRES - DOSED IN MG/24 HR) 0.2 mg/24hr patch Place 1 patch onto the skin once a week.      . lamoTRIgine (LAMICTAL) 25 MG tablet Take 2 tablets (50 mg total) by mouth 2 (two) times daily.  120 tablet  6  . levETIRAcetam (KEPPRA) 500 MG tablet Take 1 tablet (500 mg total) by mouth 2 (two) times daily.  60 tablet  6   No current facility-administered medications on file prior to visit.    ROS:  Out of a complete 14 system review  of symptoms, the patient complains only of the following symptoms, and all other reviewed systems are negative.  Swelling in the feet  Easy bruising, easy bleeding Feeling hot, cold Memory loss Decreased energy, change in appetite, disinterest in activities Insomnia, restless legs  Blood pressure 135/85, pulse 98, weight 152 lb (68.947 kg).  Physical Exam  General: The patient is alert and cooperative at the time of the examination.  Skin: No significant peripheral edema is noted.   Neurologic Exam  Cranial nerves: Facial symmetry is present. Speech is normal, no aphasia or dysarthria is noted. Extraocular movements are full. Visual fields are full.  Motor: The patient has good strength in all 4 extremities.  Sensory exam: There is no evidence of a stocking pattern pinprick  sensory deficit in the lower extremities. Vibration sensation and position sensation are intact in both feet.   Coordination: The patient has good finger-nose-finger and heel-to-shin bilaterally. The patient has a resting tremor of the left arm, and intention tremors of both upper extremities with finger-nose-finger.   Gait and station: The patient has a wide-based, unsteady gait. Tandem gait is unsteady. Romberg is negative. No drift is seen.  Reflexes: Deep tendon reflexes are symmetric, but are depressed .   Assessment/Plan:  One. Gait disorder  2. Benign essential tremor  3. History seizures  4. Alcohol abuse  The patient likely has a multifactorial gait disorder. The patient was on Dilantin on a chronic basis, and he consumes a high level of alcohol. These issues may result in permanent injury to the superior hemisphere of the cerebellum, resulting in a chronic irreversible gait disorder. This is likely the primary etiology of his walking problems. Individuals with benign essential tremors also have some issues with balance. The patient does have a resting tremor of the left arm, but he has no evidence of parkinsonism at this point. The patient will undergo a CT scan of the brain, and further blood work today. I have instructed the patient to stop drinking. The patient was given a prescription for a rolling walker, and he will continue physical therapy. The patient followup in 6 months.   Marlan Palau MD 04/16/2013 9:31 PM  [facility] [address] [place], [place] [address]  Phone [phone] Fax [phone]

## 2013-04-23 ENCOUNTER — Ambulatory Visit
Admission: RE | Admit: 2013-04-23 | Discharge: 2013-04-23 | Disposition: A | Discharge status: Home / Self Care | Payer: Medicare Other | Source: Ambulatory Visit | Attending: Neurology | Admitting: Neurology

## 2013-04-23 DIAGNOSIS — R269 Unspecified abnormalities of gait and mobility: Secondary | ICD-10-CM

## 2013-04-23 DIAGNOSIS — Z8669 Personal history of other diseases of the nervous system and sense organs: Secondary | ICD-10-CM

## 2013-04-26 ENCOUNTER — Telehealth: Payer: Self-pay | Admitting: Neurology

## 2013-04-26 NOTE — Telephone Encounter (Signed)
I called the patient. The CT shows minimal SVD. Otherwise OK. The patient was on Dilantin for 20 years, and he has been off of Dilantin 2 years. The patient is on Keppra at this point along with low-dose Lamictal. The combination of Dilantin and alcohol intake may have permanently damaged the cerebellum, leading to a gait disorder.

## 2013-07-01 ENCOUNTER — Other Ambulatory Visit: Payer: Self-pay | Admitting: Family Medicine

## 2013-07-01 ENCOUNTER — Ambulatory Visit
Admission: RE | Admit: 2013-07-01 | Discharge: 2013-07-01 | Disposition: A | Discharge status: Home / Self Care | Payer: Medicare Other | Source: Ambulatory Visit | Attending: Family Medicine | Admitting: Family Medicine

## 2013-07-01 DIAGNOSIS — M79604 Pain in right leg: Secondary | ICD-10-CM

## 2013-07-11 HISTORY — PX: EYE SURGERY: SHX253

## 2013-07-17 ENCOUNTER — Emergency Department (HOSPITAL_COMMUNITY): Payer: Medicare Other

## 2013-07-17 ENCOUNTER — Encounter (HOSPITAL_COMMUNITY): Payer: Self-pay | Admitting: Emergency Medicine

## 2013-07-17 ENCOUNTER — Inpatient Hospital Stay (HOSPITAL_COMMUNITY)
Admission: EM | Admit: 2013-07-17 | Discharge: 2013-07-24 | DRG: 380 | Disposition: A | Discharge status: Home / Self Care | Payer: Medicare Other | Attending: Internal Medicine | Admitting: Internal Medicine

## 2013-07-17 DIAGNOSIS — K265 Chronic or unspecified duodenal ulcer with perforation: Secondary | ICD-10-CM

## 2013-07-17 DIAGNOSIS — K269 Duodenal ulcer, unspecified as acute or chronic, without hemorrhage or perforation: Secondary | ICD-10-CM

## 2013-07-17 DIAGNOSIS — R5381 Other malaise: Secondary | ICD-10-CM | POA: Diagnosis present

## 2013-07-17 DIAGNOSIS — R112 Nausea with vomiting, unspecified: Secondary | ICD-10-CM | POA: Diagnosis present

## 2013-07-17 DIAGNOSIS — F17200 Nicotine dependence, unspecified, uncomplicated: Secondary | ICD-10-CM | POA: Diagnosis present

## 2013-07-17 DIAGNOSIS — G609 Hereditary and idiopathic neuropathy, unspecified: Secondary | ICD-10-CM

## 2013-07-17 DIAGNOSIS — G40909 Epilepsy, unspecified, not intractable, without status epilepticus: Secondary | ICD-10-CM

## 2013-07-17 DIAGNOSIS — R7402 Elevation of levels of lactic acid dehydrogenase (LDH): Secondary | ICD-10-CM

## 2013-07-17 DIAGNOSIS — Z860100 Personal history of colon polyps, unspecified: Secondary | ICD-10-CM

## 2013-07-17 DIAGNOSIS — Z8669 Personal history of other diseases of the nervous system and sense organs: Secondary | ICD-10-CM

## 2013-07-17 DIAGNOSIS — I10 Essential (primary) hypertension: Secondary | ICD-10-CM

## 2013-07-17 DIAGNOSIS — R7401 Elevation of levels of liver transaminase levels: Secondary | ICD-10-CM

## 2013-07-17 DIAGNOSIS — J4489 Other specified chronic obstructive pulmonary disease: Secondary | ICD-10-CM

## 2013-07-17 DIAGNOSIS — K862 Cyst of pancreas: Secondary | ICD-10-CM | POA: Diagnosis present

## 2013-07-17 DIAGNOSIS — F1010 Alcohol abuse, uncomplicated: Secondary | ICD-10-CM

## 2013-07-17 DIAGNOSIS — R892 Abnormal level of other drugs, medicaments and biological substances in specimens from other organs, systems and tissues: Secondary | ICD-10-CM

## 2013-07-17 DIAGNOSIS — E785 Hyperlipidemia, unspecified: Secondary | ICD-10-CM

## 2013-07-17 DIAGNOSIS — R740 Nonspecific elevation of levels of transaminase and lactic acid dehydrogenase [LDH]: Secondary | ICD-10-CM

## 2013-07-17 DIAGNOSIS — K863 Pseudocyst of pancreas: Secondary | ICD-10-CM

## 2013-07-17 DIAGNOSIS — Z87448 Personal history of other diseases of urinary system: Secondary | ICD-10-CM

## 2013-07-17 DIAGNOSIS — K219 Gastro-esophageal reflux disease without esophagitis: Secondary | ICD-10-CM | POA: Diagnosis present

## 2013-07-17 DIAGNOSIS — R32 Unspecified urinary incontinence: Secondary | ICD-10-CM

## 2013-07-17 DIAGNOSIS — M4802 Spinal stenosis, cervical region: Secondary | ICD-10-CM

## 2013-07-17 DIAGNOSIS — R413 Other amnesia: Secondary | ICD-10-CM

## 2013-07-17 DIAGNOSIS — E876 Hypokalemia: Secondary | ICD-10-CM | POA: Diagnosis present

## 2013-07-17 DIAGNOSIS — IMO0002 Reserved for concepts with insufficient information to code with codable children: Secondary | ICD-10-CM

## 2013-07-17 DIAGNOSIS — R109 Unspecified abdominal pain: Secondary | ICD-10-CM

## 2013-07-17 DIAGNOSIS — G40309 Generalized idiopathic epilepsy and epileptic syndromes, not intractable, without status epilepticus: Secondary | ICD-10-CM

## 2013-07-17 DIAGNOSIS — R188 Other ascites: Secondary | ICD-10-CM | POA: Diagnosis present

## 2013-07-17 DIAGNOSIS — E7210 Disorders of sulfur-bearing amino-acid metabolism, unspecified: Secondary | ICD-10-CM

## 2013-07-17 DIAGNOSIS — R269 Unspecified abnormalities of gait and mobility: Secondary | ICD-10-CM

## 2013-07-17 DIAGNOSIS — G589 Mononeuropathy, unspecified: Secondary | ICD-10-CM | POA: Diagnosis present

## 2013-07-17 DIAGNOSIS — R05 Cough: Secondary | ICD-10-CM

## 2013-07-17 DIAGNOSIS — R197 Diarrhea, unspecified: Secondary | ICD-10-CM | POA: Diagnosis present

## 2013-07-17 DIAGNOSIS — Z800 Family history of malignant neoplasm of digestive organs: Secondary | ICD-10-CM

## 2013-07-17 DIAGNOSIS — K5900 Constipation, unspecified: Secondary | ICD-10-CM | POA: Diagnosis present

## 2013-07-17 DIAGNOSIS — R059 Cough, unspecified: Secondary | ICD-10-CM

## 2013-07-17 DIAGNOSIS — Z5181 Encounter for therapeutic drug level monitoring: Secondary | ICD-10-CM

## 2013-07-17 DIAGNOSIS — Z8719 Personal history of other diseases of the digestive system: Secondary | ICD-10-CM

## 2013-07-17 DIAGNOSIS — R5383 Other fatigue: Secondary | ICD-10-CM | POA: Diagnosis present

## 2013-07-17 DIAGNOSIS — J449 Chronic obstructive pulmonary disease, unspecified: Secondary | ICD-10-CM

## 2013-07-17 DIAGNOSIS — K859 Acute pancreatitis without necrosis or infection, unspecified: Secondary | ICD-10-CM | POA: Diagnosis present

## 2013-07-17 LAB — HEPATIC FUNCTION PANEL
ALT: 6 U/L (ref 0–53)
AST: 13 U/L (ref 0–37)
Albumin: 2.9 g/dL — ABNORMAL LOW (ref 3.5–5.2)
Alkaline Phosphatase: 81 U/L (ref 39–117)
Bilirubin, Direct: 0.2 mg/dL (ref 0.0–0.3)
Indirect Bilirubin: 0.7 mg/dL (ref 0.3–0.9)
Total Bilirubin: 0.9 mg/dL (ref 0.3–1.2)
Total Protein: 6.3 g/dL (ref 6.0–8.3)

## 2013-07-17 LAB — POCT I-STAT TROPONIN I: Troponin i, poc: 0.02 ng/mL (ref 0.00–0.08)

## 2013-07-17 LAB — CBC WITH DIFFERENTIAL/PLATELET
Basophils Absolute: 0 10*3/uL (ref 0.0–0.1)
Basophils Relative: 0 % (ref 0–1)
Eosinophils Absolute: 0 10*3/uL (ref 0.0–0.7)
Eosinophils Relative: 0 % (ref 0–5)
HCT: 44.5 % (ref 39.0–52.0)
Hemoglobin: 15.7 g/dL (ref 13.0–17.0)
Lymphocytes Relative: 10 % — ABNORMAL LOW (ref 12–46)
Lymphs Abs: 2.2 10*3/uL (ref 0.7–4.0)
MCH: 38.2 pg — ABNORMAL HIGH (ref 26.0–34.0)
MCHC: 35.3 g/dL (ref 30.0–36.0)
MCV: 108.3 fL — ABNORMAL HIGH (ref 78.0–100.0)
Monocytes Absolute: 1.4 10*3/uL — ABNORMAL HIGH (ref 0.1–1.0)
Monocytes Relative: 6 % (ref 3–12)
Neutro Abs: 18.3 10*3/uL — ABNORMAL HIGH (ref 1.7–7.7)
Neutrophils Relative %: 84 % — ABNORMAL HIGH (ref 43–77)
Platelets: 271 10*3/uL (ref 150–400)
RBC: 4.11 MIL/uL — ABNORMAL LOW (ref 4.22–5.81)
RDW: 14.2 % (ref 11.5–15.5)
WBC: 22 10*3/uL — ABNORMAL HIGH (ref 4.0–10.5)

## 2013-07-17 LAB — URINE MICROSCOPIC-ADD ON

## 2013-07-17 LAB — URINALYSIS, ROUTINE W REFLEX MICROSCOPIC
Glucose, UA: NEGATIVE mg/dL
Hgb urine dipstick: NEGATIVE
Ketones, ur: NEGATIVE mg/dL
Nitrite: NEGATIVE
Protein, ur: 30 mg/dL — AB
Specific Gravity, Urine: 1.037 — ABNORMAL HIGH (ref 1.005–1.030)
Urobilinogen, UA: 1 mg/dL (ref 0.0–1.0)
pH: 5.5 (ref 5.0–8.0)

## 2013-07-17 LAB — POCT I-STAT, CHEM 8
BUN: 19 mg/dL (ref 6–23)
Calcium, Ion: 1.17 mmol/L (ref 1.13–1.30)
Chloride: 95 mEq/L — ABNORMAL LOW (ref 96–112)
Creatinine, Ser: 0.9 mg/dL (ref 0.50–1.35)
Glucose, Bld: 104 mg/dL — ABNORMAL HIGH (ref 70–99)
HCT: 52 % (ref 39.0–52.0)
Hemoglobin: 17.7 g/dL — ABNORMAL HIGH (ref 13.0–17.0)
Potassium: 4.1 mEq/L (ref 3.7–5.3)
Sodium: 136 mEq/L — ABNORMAL LOW (ref 137–147)
TCO2: 31 mmol/L (ref 0–100)

## 2013-07-17 LAB — LIPASE, BLOOD: Lipase: 103 U/L — ABNORMAL HIGH (ref 11–59)

## 2013-07-17 LAB — OCCULT BLOOD, POC DEVICE: Fecal Occult Bld: NEGATIVE

## 2013-07-17 LAB — CG4 I-STAT (LACTIC ACID): Lactic Acid, Venous: 3.91 mmol/L — ABNORMAL HIGH (ref 0.5–2.2)

## 2013-07-17 MED ORDER — SODIUM CHLORIDE 0.9 % IV SOLN
INTRAVENOUS | Status: DC
  Administered 2013-07-18: 02:00:00 via INTRAVENOUS

## 2013-07-17 MED ORDER — THIAMINE HCL 100 MG/ML IJ SOLN
Freq: Once | INTRAVENOUS | Status: AC
  Administered 2013-07-17: via INTRAVENOUS
  Filled 2013-07-17: qty 1000

## 2013-07-17 MED ORDER — IOHEXOL 300 MG/ML  SOLN
100.0000 mL | Freq: Once | INTRAMUSCULAR | Status: AC | PRN
  Administered 2013-07-17: 100 mL via INTRAVENOUS

## 2013-07-17 MED ORDER — PIPERACILLIN-TAZOBACTAM 3.375 G IVPB
3.3750 g | Freq: Three times a day (TID) | INTRAVENOUS | Status: DC
  Administered 2013-07-18 – 2013-07-23 (×17): 3.375 g via INTRAVENOUS
  Filled 2013-07-17 (×19): qty 50

## 2013-07-17 MED ORDER — LORAZEPAM 2 MG/ML IJ SOLN: 1.0000 mg | Freq: Four times a day (QID) | INTRAMUSCULAR | Status: AC | PRN

## 2013-07-17 MED ORDER — HEPARIN SODIUM (PORCINE) 5000 UNIT/ML IJ SOLN
5000.0000 [IU] | Freq: Three times a day (TID) | INTRAMUSCULAR | Status: DC
  Administered 2013-07-18 – 2013-07-24 (×20): 5000 [IU] via SUBCUTANEOUS
  Filled 2013-07-17 (×25): qty 1

## 2013-07-17 MED ORDER — HYDROMORPHONE HCL PF 1 MG/ML IJ SOLN: 0.5000 mg | INTRAMUSCULAR | Status: DC | PRN

## 2013-07-17 MED ORDER — SODIUM CHLORIDE 0.9 % IV SOLN
8.0000 mg/h | INTRAVENOUS | Status: AC
  Administered 2013-07-17 – 2013-07-20 (×7): 8 mg/h via INTRAVENOUS
  Filled 2013-07-17 (×14): qty 80

## 2013-07-17 MED ORDER — NICOTINE 14 MG/24HR TD PT24
14.0000 mg | MEDICATED_PATCH | Freq: Every day | TRANSDERMAL | Status: DC
  Administered 2013-07-18 – 2013-07-24 (×7): 14 mg via TRANSDERMAL
  Filled 2013-07-17 (×8): qty 1

## 2013-07-17 MED ORDER — SODIUM CHLORIDE 0.9 % IV SOLN
1000.0000 mL | Freq: Once | INTRAVENOUS | Status: AC
  Administered 2013-07-17: 1000 mL via INTRAVENOUS

## 2013-07-17 MED ORDER — LORAZEPAM 1 MG PO TABS: 1.0000 mg | ORAL_TABLET | Freq: Four times a day (QID) | ORAL | Status: AC | PRN

## 2013-07-17 MED ORDER — ONDANSETRON HCL 4 MG/2ML IJ SOLN: 4.0000 mg | Freq: Four times a day (QID) | INTRAMUSCULAR | Status: DC | PRN

## 2013-07-17 MED ORDER — PANTOPRAZOLE SODIUM 40 MG IV SOLR: 40.0000 mg | Freq: Two times a day (BID) | INTRAVENOUS | Status: DC

## 2013-07-17 MED ORDER — SODIUM CHLORIDE 0.9 % IV SOLN: 1000.0000 mL | INTRAVENOUS | Status: DC

## 2013-07-17 MED ORDER — SODIUM CHLORIDE 0.9 % IV SOLN
750.0000 mg | Freq: Two times a day (BID) | INTRAVENOUS | Status: DC
  Administered 2013-07-18 – 2013-07-23 (×12): 750 mg via INTRAVENOUS
  Filled 2013-07-17 (×14): qty 7.5

## 2013-07-17 MED ORDER — SODIUM CHLORIDE 0.9 % IV BOLUS (SEPSIS)
1000.0000 mL | Freq: Once | INTRAVENOUS | Status: AC
  Administered 2013-07-17: 1000 mL via INTRAVENOUS

## 2013-07-17 MED ORDER — PANTOPRAZOLE SODIUM 40 MG IV SOLR
40.0000 mg | Freq: Two times a day (BID) | INTRAVENOUS | Status: DC
  Administered 2013-07-21 – 2013-07-23 (×5): 40 mg via INTRAVENOUS
  Filled 2013-07-17 (×8): qty 40

## 2013-07-17 MED ORDER — SODIUM CHLORIDE 0.9 % IV SOLN
500.0000 mg | Freq: Two times a day (BID) | INTRAVENOUS | Status: DC
  Filled 2013-07-17 (×2): qty 5

## 2013-07-17 MED ORDER — MORPHINE SULFATE 4 MG/ML IJ SOLN
4.0000 mg | Freq: Once | INTRAMUSCULAR | Status: AC
  Administered 2013-07-17: 4 mg via INTRAVENOUS
  Filled 2013-07-17: qty 1

## 2013-07-17 MED ORDER — SODIUM CHLORIDE 0.9 % IV SOLN
80.0000 mg | Freq: Once | INTRAVENOUS | Status: AC
  Administered 2013-07-17: 80 mg via INTRAVENOUS
  Filled 2013-07-17: qty 80

## 2013-07-17 MED ORDER — LORAZEPAM 2 MG/ML IJ SOLN
0.0000 mg | Freq: Four times a day (QID) | INTRAMUSCULAR | Status: AC
  Administered 2013-07-17: 2 mg via INTRAVENOUS
  Filled 2013-07-17: qty 1

## 2013-07-17 MED ORDER — PIPERACILLIN-TAZOBACTAM 3.375 G IVPB
3.3750 g | Freq: Once | INTRAVENOUS | Status: AC
  Administered 2013-07-17: 3.375 g via INTRAVENOUS
  Filled 2013-07-17: qty 50

## 2013-07-17 MED ORDER — ONDANSETRON HCL 4 MG PO TABS: 4.0000 mg | ORAL_TABLET | Freq: Four times a day (QID) | ORAL | Status: DC | PRN

## 2013-07-17 MED ORDER — SODIUM CHLORIDE 0.9 % IJ SOLN
3.0000 mL | Freq: Two times a day (BID) | INTRAMUSCULAR | Status: DC
  Administered 2013-07-18 – 2013-07-19 (×2): 3 mL via INTRAVENOUS
  Administered 2013-07-20: 10 mL via INTRAVENOUS
  Administered 2013-07-22 – 2013-07-23 (×4): 3 mL via INTRAVENOUS

## 2013-07-17 MED ORDER — ONDANSETRON HCL 4 MG/2ML IJ SOLN
4.0000 mg | Freq: Once | INTRAMUSCULAR | Status: AC
  Administered 2013-07-17: 4 mg via INTRAVENOUS
  Filled 2013-07-17: qty 2

## 2013-07-17 MED ORDER — LORAZEPAM 2 MG/ML IJ SOLN
0.0000 mg | Freq: Two times a day (BID) | INTRAMUSCULAR | Status: AC
Start: 2013-07-19 — End: 2013-07-21
  Administered 2013-07-19: 2 mg via INTRAVENOUS
  Filled 2013-07-17: qty 1

## 2013-07-17 NOTE — ED Notes (Signed)
Pt states that he goes through episodes about once a month that lasts for about 2 wks.  Starts as back pain that goes into his low abd.  States that he has been having NV.  States that his PCP did a rectal exam and sent him here because she found blood.

## 2013-07-17 NOTE — Progress Notes (Signed)
ANTIBIOTIC CONSULT NOTE - INITIAL  Pharmacy Consult for Zosyn Indication: Possible perforated duodenum  Allergies  Allergen Reactions  . Lyrica [Pregabalin]     Hallucinations.    . Sulfonamide Derivatives     REACTION: unknown as a child  . Valsartan     REACTION: lightheaded    Patient Measurements:   Wt=68 kg  Vital Signs: Temp: 100.2 F (37.9 C) (01/07 2032) Temp src: Rectal (01/07 2032) BP: 156/102 mmHg (01/07 1559) Pulse Rate: 118 (01/07 1559) Intake/Output from previous day:   Intake/Output from this shift:    Labs:  Recent Labs  07/17/13 1920 07/17/13 1939  WBC 22.0*  --   HGB 15.7 17.7*  PLT 271  --   CREATININE  --  0.90   The CrCl is unknown because both a height and weight (above a minimum accepted value) are required for this calculation. No results found for this basename: VANCOTROUGH, VANCOPEAK, VANCORANDOM, GENTTROUGH, GENTPEAK, GENTRANDOM, TOBRATROUGH, TOBRAPEAK, TOBRARND, AMIKACINPEAK, AMIKACINTROU, AMIKACIN,  in the last 72 hours   Microbiology: No results found for this or any previous visit (from the past 720 hour(s)).  Medical History: Past Medical History  Diagnosis Date  . HYPERTENSION 07/31/2009  . Seizures   . Degenerative joint disease of low back   . Gait disorder   . Neuropathy   . Complex regional pain syndrome of left lower extremity   . Osteopenia   . Memory disorder   . Right humeral fracture   . Essential tremor   . Alcohol abuse     Medications:  Scheduled:  . heparin  5,000 Units Subcutaneous Q8H  . LORazepam  0-4 mg Intravenous Q6H   Followed by  . [START ON 07/19/2013] LORazepam  0-4 mg Intravenous Q12H  . [START ON 07/18/2013] nicotine  14 mg Transdermal Daily  . [START ON 07/21/2013] pantoprazole (PROTONIX) IV  40 mg Intravenous Q12H  . sodium chloride  3 mL Intravenous Q12H   Infusions:  . sodium chloride    . sodium chloride    . levETIRAcetam    . pantoprazole (PROTONIX) IV 80 mg (07/17/13 2253)  .  pantoprozole (PROTONIX) infusion    . [START ON 07/18/2013] piperacillin-tazobactam (ZOSYN)  IV    . banana bag IV 1000 mL     Assessment: 63 yo c/o constipation, vomiting and abdominal pain.  Zosyn per Rx for suspected perforated duodenum.  Goal of Therapy:  Treat infection  Plan:   Zosyn 3.375 Gm IV q8h  EI infusion  F/U SCr/cultures as needed  Lawana Pai R 07/17/2013,11:10 PM

## 2013-07-17 NOTE — H&P (Signed)
[facility] History and Physical  Patient: Jeffrey Castillo  [id number]  DOB: 03/10/1951  DOS: the patient was seen and examined on 07/17/2013 PCP: BABAOFF, [name], MD  Chief Complaint: Abdominal pain  HPI: Jeffrey Castillo is a 63 y.o. pleasant male with Past medical history of hypertension, seasonal disorder, neuropathy, alcohol abuse. The patient is coming from home. The patient presents with complaints of right upper quadrant pain that has been ongoing on and off since last 4 months. The history has been obtained from both patient and his wife. As per the wife the patient usually drinks 24 ounces of whiskey in a day which he has cut back to 6 ounces of whiskey in a day since last 2-1/2 month. As per the patient since last 4 months he has been having on and off pain in the right upper quadrant which radiates to his right back and eventually settled down to his lower abdomen and pelvis this episode is associated with constipation and is followed by diarrhea without any blood. During his episode the patient also had episodes of vomiting and therefore stopped eating. He mentions this episode have been lasting for 2 weeks roughly and reoccurring every 4 weeks. Since this time it was his fourth or fifth episode therefore he went to see his PCP who referred him to the hospital for further evaluation. Current episode has been ongoing since last 4 days with severe pain in his right upper quadrant associated with acid reflux nausea and vomiting and had one episode of loose wall motion without any blood [name] he denies any sense of fever or chills chest pain or shortness of breath. He also denies any burning urination. He denies any dizziness lightheadedness but does mention about generalized lethargy.  Review of Systems: as mentioned in the history of present illness.  A Comprehensive review of the other systems is negative.  Past Medical History  Diagnosis Date  . HYPERTENSION 07/31/2009  .  Seizures   . Degenerative joint disease of low back   . Gait disorder   . Neuropathy   . Complex regional pain syndrome of left lower extremity   . Osteopenia   . Memory disorder   . Right humeral fracture   . Essential tremor   . Alcohol abuse    Past Surgical History  Procedure Laterality Date  . Foot surgery      2 occasions  . Shoulder surgery Right   . Vasectomy    . Bladder leision    . Bunionectomy    . Lumbar spine surgery      2 occasions for left L5 radiculopathy  . Cervical spine surgery  08/2010    C5-6-7  . Spinal cord stimulator implant  02/2011   Social History:  reports that he has been smoking Cigarettes.  He has been smoking about 1.00 pack per day. He does not have any smokeless tobacco history on file. He reports that he drinks alcohol. He reports that he does not use illicit drugs. Independent for most of his  ADL.  Allergies  Allergen Reactions  . Lyrica [Pregabalin]     Hallucinations.    . Sulfonamide Derivatives     REACTION: unknown as a child  . Valsartan     REACTION: lightheaded    Family History  Problem Relation Age of Onset  . Colon cancer Father   . Heart disease Brother     Prior to Admission medications   Medication Sig Start Date End Date  Taking? Authorizing Provider  cloNIDine (CATAPRES - DOSED IN MG/24 HR) 0.2 mg/24hr patch Place 1 patch onto the skin once a week.   Yes Historical Provider, MD  doxycycline (VIBRA-TABS) 100 MG tablet Take 100 mg by mouth 2 (two) times daily. For 10 days.   Yes Historical Provider, MD  lamoTRIgine (LAMICTAL) 25 MG tablet Take 2 tablets (50 mg total) by mouth 2 (two) times daily. 02/06/13  Yes Kathrynn Ducking, MD  levETIRAcetam (KEPPRA) 500 MG tablet Take 1 tablet (500 mg total) by mouth 2 (two) times daily. 02/06/13  Yes Kathrynn Ducking, MD    Physical Exam: Filed Vitals:   07/17/13 1559 07/17/13 2032  BP: 156/102   Pulse: 118   Temp: 99.3 F (37.4 C) 100.2 F (37.9 C)  TempSrc: Oral Rectal   Resp: 16   SpO2: 98%     General: Alert, Awake and Oriented to Time, Place and Person. Appear in moderate distress Eyes: PERRL ENT: Oral Mucosa clear dry. Neck: No JVD Cardiovascular: S1 and S2 Present, no Murmur, Peripheral Pulses Present Respiratory: Bilateral Air entry equal and Decreased, Clear to Auscultation,  No Crackles, no wheezes Abdomen: Bowel Sound Present, Soft and diffuse abdominal tendernes with on and regarding without any rigidity Skin: No Rash Extremities: No Pedal edema, no calf tenderness Neurologic: Grossly Unremarkable.  Labs on Admission:  CBC:  Recent Labs Lab 07/17/13 1920 07/17/13 1939  WBC 22.0*  --   NEUTROABS 18.3*  --   HGB 15.7 17.7*  HCT 44.5 52.0  MCV 108.3*  --   PLT 271  --     CMP     Component Value Date/Time   NA 136* 07/17/2013 1939   K 4.1 07/17/2013 1939   CL 95* 07/17/2013 1939   CO2 27 12/26/2010 2116   GLUCOSE 104* 07/17/2013 1939   BUN 19 07/17/2013 1939   CREATININE 0.90 07/17/2013 1939   CALCIUM 9.1 12/26/2010 2116   PROT 6.3 07/17/2013 1920   ALBUMIN 2.9* 07/17/2013 1920   AST 13 07/17/2013 1920   ALT 6 07/17/2013 1920   ALKPHOS 81 07/17/2013 1920   BILITOT 0.9 07/17/2013 1920   GFRNONAA >60 12/26/2010 2116   GFRAA >60 12/26/2010 2116     Recent Labs Lab 07/17/13 1920  LIPASE 103*   No results found for this basename: AMMONIA,  in the last 168 hours  No results found for this basename: CKTOTAL, CKMB, CKMBINDEX, TROPONINI,  in the last 168 hours BNP (last 3 results) No results found for this basename: PROBNP,  in the last 8760 hours  Radiological Exams on Admission: Ct Abdomen Pelvis W Contrast  07/17/2013   CLINICAL DATA:  Lower abdominal pain  EXAM: CT ABDOMEN AND PELVIS WITH CONTRAST  TECHNIQUE: Multidetector CT imaging of the abdomen and pelvis was performed using the standard protocol following bolus administration of intravenous contrast.  CONTRAST:  125mL OMNIPAQUE IOHEXOL 300 MG/ML  SOLN  COMPARISON:  05/29/2007  FINDINGS:  Spinal cord stimulator is in place extending from the upper lumbar spine at canal into the thoracic posterior spinal canal.  Nonspecific hypodensities are present within the liver. The liver is somewhat cirrhotic. The largest is towards the dome measuring 1.0 cm. They are most likely cysts.  Small gallstone is present. No obvious inflammatory changes of the gallbladder.  There is a significant inflammatory process occurring within the mesenteric. The inflammatory process relatively spares the pancreas. There is some stranding towards the tail of the pancreas. The inflammatory process is  most intimately associated with the 3rd and 4th portions of the duodenum. This is associated with wall thickening. There is also a possible defect within the wall of the duodenum. See image 54 of series 4 on the coronal reconstructions.  There is a complex cystic mass within the uncinate process of the pancreas measuring 2.8 x 2.7 cm. Fluid density is seen extending from the duodenum and uncinate process into the mesenteric.  There is a complex fluid and soft tissue mass within the mesentery measuring 3.6 x 6.8 cm on image 44. This may represent phlegmon of or necrotic adenopathy. Similar densities with some fat density are seen in the posterior right perinephric space. There is a discoid fluid collection superior to the right kidney in the retroperitoneum measuring 2.8 x 5.5 cm.  Of note, there is no definitive free intraperitoneal gas.  Adrenal glands are within normal limits. Simple cysts in both kidneys.  Sigmoid diverticulosis without definite acute diverticulitis. Bladder is within normal limits.  Degenerative and postoperative changes in the lumbar spine.  IMPRESSION: There is a prominent inflammatory process within the mesenteries of the abdomen as described above. The most critical finding is inflammatory changes intimately associated with the distal duodenum an a possible defect within the duodenum. There is no definite  extraperitoneal or intraperitoneal gas however perforation cannot be excluded. There is relative sparing of the inflammatory process to the pancreas.  Other intra-abdominal fluid collections as described.  Complex mass within the mesentery which may represent adenopathy or an inflammatory phlegmon. Similar densities posterior to the right kidney.  Cholelithiasis.  Nonspecific hypodensities in the liver. Because the liver is somewhat cirrhotic. The patient is at risk for liver malignancy. Follow-up outpatient MRI is recommended.   Electronically Signed   By: Maryclare Bean M.D.   On: 07/17/2013 20:22   Dg Abd Acute W/chest  07/17/2013   CLINICAL DATA:  Constipation, nausea, vomiting, rectal bleeding, history hypertension  EXAM: ACUTE ABDOMEN SERIES (ABDOMEN 2 VIEW & CHEST 1 VIEW)  COMPARISON:  Chest radiographs 08/13/2010  FINDINGS: Rotated on chest radiograph to the left.  Normal heart size, mediastinal contours, and pulmonary vascularity.  Lungs appear emphysematous but clear.  No pleural effusion or pneumothorax.  Prior cervical spine fusion.  Intraspinal stimulator leads noted.  Bones diffusely demineralized.  Normal bowel gas pattern.  No bowel dilatation or bowel wall thickening or free intraperitoneal air.  Scattered atherosclerotic calcifications.  No definite urinary tract calcification.  IMPRESSION: Question COPD.  No acute abdominal findings.   Electronically Signed   By: Lavonia Dana M.D.   On: 07/17/2013 20:00    EKG: Independently reviewed. normal EKG, normal sinus rhythm, there are no previous tracings available for comparison.  Assessment/Plan Principal Problem:   Suspected Duodenal ulcer perforation Active Problems:   HYPERTENSION   C O P D   SEIZURE DISORDER, HX OF   1. Duodenal ulcer perforation The patient is presenting with complaint of abdominal pain and has undergone a CT scan with contrast in the ED which is suggestive of mesenteric inflammation associated with distal duodenal  inflammation and possible defect suggesting perforation of duodenal without any free air intraperitoneally. Patient is hemodynamically stable and does not appear toxic at present although he has fever, tachycardia, leukocytosis, elevated lactic acid does meeting the criteria for possible sepsis. At present he will be admitted to step down unit. General surgery has been consulted and appreciated input. As per general surgery currently he we will approach medical management with aggressive IV  fluids, Protonix infusion after a bolus, IV Zosyn per pharmacy, keeping the patient n.p.o.. After discussing with the Gen. surgery per their recommendation NG tube has not required. His comorbidities include COPD, hypertension, seizure disorder and alcohol abuse.  2. Hypertension Patient is leading a clonidine patch at present I would continue the same patch since abrupt removal of the patch may lead to clonidine withdrawal . Monitor his blood pressure closely.Every 4 hours and step down  3.Alcohol abuse  Patient will be placed on [place] protocol at present does not appear in withdrawal or delirium tremens.  4.COPD Oxygen and albuterol as needed  5. seizure disorder Patient is on Lamictal and Keppra.  since Lamictal is not available in IV form, I discussed with on-call neurology on the phone and increase the patient's Keppra from 500 mg to 750 mg twice a day. He would hold Lamictal. Seizure precautions.  Consults: General surgery  DVT Prophylaxis: subcutaneous Heparin Nutrition: N.p.o.  Code Status: Full  Family Communication: Wife  was present at bedside, opportunity was given to ask question and all questions were answered satisfactorily at the time of interview. Disposition: Admitted to inpatient in step-down unit.  Author: Berle Mull, MD [facility] Pager: [phone] 07/17/2013, 10:34 PM    If 7PM-7AM, please contact night-coverage [email or web address] Password [email or web address]

## 2013-07-17 NOTE — ED Provider Notes (Signed)
CSN: [id number]     Arrival date & time 07/17/13  1548 History   First MD Initiated Contact with Patient 07/17/13 1739     Chief Complaint  Patient presents with  . Constipation  . Nausea  . Emesis  . Rectal Bleeding   (Consider location/radiation/quality/duration/timing/severity/associated sxs/prior Treatment) HPI  Patient is a 63 yo white male who presents today with complaints of constipation and vomiting. Pt states that he has had ongoing symptoms of diarrhea, constipation, and rectal bleeding for the past 4 months. Saw PCP today and PCP was concerned with rectal bleeding, constipation, and "rock-hard" stomach. These episodes are accompanied by lower right back pain that radiates throughout lower back over time and eventually settles into lower abdomen and pelvis. Pt has not had a BM for the past 4 days, he usually have BM twice daily. Currently has pain throughout abdomen and rates pain 6/10. Pain worsens with movement and has no pain when lying/sitting still. Does experience sudden jolts of abdominal pain and pain is intermittent in nature. Experiences episodes of vomiting about once a day, but denies blood in vomit. Occasional cough.  Denies nausea preceding episodes and explains they come on suddenly. Unable to eat and drink normally due to nausea and vomit. Has tried Tums twice a day for discomfort but found no relief. Denies family hx of cancer or rectal bleeding. 50 pack yr hx of smoking, consumes roughly 6 oz of liquor daily for 40 years, and hx of marijuana use, but not recently. Denies recent surgeries or hospitalizations. Denies recent medication changes. Denies fever, vision or hearing changes, chest pain, shortness of breath, lightheadedness, weight changes, night sweats.  Denies any recent trauma.   Past Medical History  Diagnosis Date  . HYPERTENSION 07/31/2009  . Seizures   . Degenerative joint disease of low back   . Gait disorder   . Neuropathy   . Complex regional pain  syndrome of left lower extremity   . Osteopenia   . Memory disorder   . Right humeral fracture   . Essential tremor   . Alcohol abuse    Past Surgical History  Procedure Laterality Date  . Foot surgery      2 occasions  . Shoulder surgery Right   . Vasectomy    . Bladder leision    . Bunionectomy    . Lumbar spine surgery      2 occasions for left L5 radiculopathy  . Cervical spine surgery  08/2010    C5-6-7  . Spinal cord stimulator implant  02/2011   Family History  Problem Relation Age of Onset  . Colon cancer Father   . Heart disease Brother    History  Substance Use Topics  . Smoking status: Current Every Day Smoker -- 1.00 packs/day    Types: Cigarettes  . Smokeless tobacco: Not on file  . Alcohol Use: Yes     Comment: 1/5 Of liquor daily    Review of Systems  Constitutional: Negative for fever and chills.  HENT: Negative for congestion.   Respiratory: Positive for cough. Negative for chest tightness and shortness of breath.   Cardiovascular: Negative for chest pain.  Gastrointestinal: Positive for vomiting and constipation.  Neurological: Positive for weakness.    Allergies  Lyrica; Sulfonamide derivatives; and Valsartan  Home Medications   Current Outpatient Rx  Name  Route  Sig  Dispense  Refill  . cloNIDine (CATAPRES - DOSED IN MG/24 HR) 0.2 mg/24hr patch   Transdermal   Place  1 patch onto the skin once a week.         . doxycycline (VIBRA-TABS) 100 MG tablet   Oral   Take 100 mg by mouth 2 (two) times daily. For 10 days.         Marland Kitchen lamoTRIgine (LAMICTAL) 25 MG tablet   Oral   Take 2 tablets (50 mg total) by mouth 2 (two) times daily.   120 tablet   6   . levETIRAcetam (KEPPRA) 500 MG tablet   Oral   Take 1 tablet (500 mg total) by mouth 2 (two) times daily.   60 tablet   6    BP 156/102  Pulse 118  Temp(Src) 99.3 F (37.4 C) (Oral)  Resp 16  SpO2 98% Physical Exam  Constitutional: He is oriented to person, place, and time.   Chronically ill appearing male, appears older than stated age  HENT:  Mouth/Throat: Oropharynx is clear and moist.  Eyes: No scleral icterus.  Neck: No JVD present.  Cardiovascular: Regular rhythm and normal heart sounds.   Tachycardia without M/R/G  Pulmonary/Chest: Breath sounds normal. He has no wheezes. He has no rales.  Abdominal: He exhibits no mass. There is generalized tenderness. There is rebound and guarding.  Pt has diffused tenderness on palpation without focal point tenderness.  Has guarding and rebound tenderness.  No hernia noted.    No abdominal distension, no caput medusa  Genitourinary:  Normal rectal tone, no active rectal bleeding, no melena, no mass, no rectal pain  Musculoskeletal: He exhibits no edema.  Neurological: He is alert and oriented to person, place, and time.  Skin:  Skin is dry, poor skin turgor, with multiple ecchymosis to both forearm at different stage of healing.    Back: Denies pain on palpation  ED Course  Procedures (including critical care time)  7:19 PM Pt here with diffused abd pain with peritoneal signs.  DDx includes bowel perforation, SBO, aortic dissection, diverticulosis/itis, colitis, malignancy, AVM, bowel ischemia, UTI. Since pt has N/V, and abd pain will obtain CXR, ECG, and trop.  Will obtain acute abd series to r/o free air.  Will also obtain abd/pelvis CT for further evaluation.  Care discussed with Dr. Tomi Bamberger.  Pain medication given.  8:34 PM CT shows inflammatory changes associated with the distal duodenum, although no obvious intraperitoneal gas, however perforation cannot be excluded.  Since pt has peritoneal sign, an elevated WBC of 22 with a left shift i suspect bowel perforation.  Will start Zosyn abx, care discussed with Dr. Tomi Bamberger, will consult surgery for further care.  Pt made aware of finding.    8:51 PM Care discussed with Generally Surgeon who agrees to see pt in ER and will decide if pt need surgical intervention or  admission through Medicine.    9:07 PM I have consulted with Surgery, Dr. Marcello Moores who request to have pt admitted by medicine and she will see pt later.  Recommend broad spectrum abx, protonix, ivf, and made pt NPO.  Will consult medicine for admission  9:38 PM I have consulted [facility], Dr. Posey Pronto who agrees to see pt in ER and will admit.    Labs Review Labs Reviewed  CBC WITH DIFFERENTIAL - Abnormal; Notable for the following:    WBC 22.0 (*)    RBC 4.11 (*)    MCV 108.3 (*)    MCH 38.2 (*)    Neutrophils Relative % 84 (*)    Neutro Abs 18.3 (*)    Lymphocytes  Relative 10 (*)    Monocytes Absolute 1.4 (*)    All other components within normal limits  HEPATIC FUNCTION PANEL - Abnormal; Notable for the following:    Albumin 2.9 (*)    All other components within normal limits  LIPASE, BLOOD - Abnormal; Notable for the following:    Lipase 103 (*)    All other components within normal limits  POCT I-STAT, CHEM 8 - Abnormal; Notable for the following:    Sodium 136 (*)    Chloride 95 (*)    Glucose, Bld 104 (*)    Hemoglobin 17.7 (*)    All other components within normal limits  URINALYSIS, ROUTINE W REFLEX MICROSCOPIC  OCCULT BLOOD, POC DEVICE  POCT I-STAT TROPONIN I   Imaging Review Ct Abdomen Pelvis W Contrast  07/17/2013   CLINICAL DATA:  Lower abdominal pain  EXAM: CT ABDOMEN AND PELVIS WITH CONTRAST  TECHNIQUE: Multidetector CT imaging of the abdomen and pelvis was performed using the standard protocol following bolus administration of intravenous contrast.  CONTRAST:  155mL OMNIPAQUE IOHEXOL 300 MG/ML  SOLN  COMPARISON:  05/29/2007  FINDINGS: Spinal cord stimulator is in place extending from the upper lumbar spine at canal into the thoracic posterior spinal canal.  Nonspecific hypodensities are present within the liver. The liver is somewhat cirrhotic. The largest is towards the dome measuring 1.0 cm. They are most likely cysts.  Small gallstone is present. No  obvious inflammatory changes of the gallbladder.  There is a significant inflammatory process occurring within the mesenteric. The inflammatory process relatively spares the pancreas. There is some stranding towards the tail of the pancreas. The inflammatory process is most intimately associated with the 3rd and 4th portions of the duodenum. This is associated with wall thickening. There is also a possible defect within the wall of the duodenum. See image 54 of series 4 on the coronal reconstructions.  There is a complex cystic mass within the uncinate process of the pancreas measuring 2.8 x 2.7 cm. Fluid density is seen extending from the duodenum and uncinate process into the mesenteric.  There is a complex fluid and soft tissue mass within the mesentery measuring 3.6 x 6.8 cm on image 44. This may represent phlegmon of or necrotic adenopathy. Similar densities with some fat density are seen in the posterior right perinephric space. There is a discoid fluid collection superior to the right kidney in the retroperitoneum measuring 2.8 x 5.5 cm.  Of note, there is no definitive free intraperitoneal gas.  Adrenal glands are within normal limits. Simple cysts in both kidneys.  Sigmoid diverticulosis without definite acute diverticulitis. Bladder is within normal limits.  Degenerative and postoperative changes in the lumbar spine.  IMPRESSION: There is a prominent inflammatory process within the mesenteries of the abdomen as described above. The most critical finding is inflammatory changes intimately associated with the distal duodenum an a possible defect within the duodenum. There is no definite extraperitoneal or intraperitoneal gas however perforation cannot be excluded. There is relative sparing of the inflammatory process to the pancreas.  Other intra-abdominal fluid collections as described.  Complex mass within the mesentery which may represent adenopathy or an inflammatory phlegmon. Similar densities posterior  to the right kidney.  Cholelithiasis.  Nonspecific hypodensities in the liver. Because the liver is somewhat cirrhotic. The patient is at risk for liver malignancy. Follow-up outpatient MRI is recommended.   Electronically Signed   By: Maryclare Bean M.D.   On: 07/17/2013 20:22   Dg  Abd Acute W/chest  07/17/2013   CLINICAL DATA:  Constipation, nausea, vomiting, rectal bleeding, history hypertension  EXAM: ACUTE ABDOMEN SERIES (ABDOMEN 2 VIEW & CHEST 1 VIEW)  COMPARISON:  Chest radiographs 08/13/2010  FINDINGS: Rotated on chest radiograph to the left.  Normal heart size, mediastinal contours, and pulmonary vascularity.  Lungs appear emphysematous but clear.  No pleural effusion or pneumothorax.  Prior cervical spine fusion.  Intraspinal stimulator leads noted.  Bones diffusely demineralized.  Normal bowel gas pattern.  No bowel dilatation or bowel wall thickening or free intraperitoneal air.  Scattered atherosclerotic calcifications.  No definite urinary tract calcification.  IMPRESSION: Question COPD.  No acute abdominal findings.   Electronically Signed   By: Lavonia Dana M.D.   On: 07/17/2013 20:00    EKG Interpretation    Date/Time:  Wednesday July 17 2013 19:05:05 EST Ventricular Rate:  87 PR Interval:  106 QRS Duration: 88 QT Interval:  395 QTC Calculation: 475 R Axis:   26 Text Interpretation:  Sinus rhythm Atrial premature complex Short PR interval Low voltage, precordial leads Abnormal R-wave progression, early transition Minimal ST depression, anterolateral leads Minimal ST elevation, inferior leads Poor data quality in current ECG precludes serial comparison Confirmed by KNAPP  MD-J, JON ([id number]) on 07/17/2013 7:08:22 PM            MDM   1. Duodenal ulcer   2. Abdominal  pain, other specified site    BP 156/102  Pulse 118  Temp(Src) 100.2 F (37.9 C) (Rectal)  Resp 16  SpO2 98%  I have reviewed nursing notes and vital signs. I personally reviewed the imaging tests through PACS  system  I reviewed available ER/hospitalization records thought the EMR     Domenic Moras, PA-C 07/18/13 0000

## 2013-07-17 NOTE — ED Provider Notes (Signed)
Medical screening examination/treatment/procedure(s) were conducted as a shared visit with non-physician practitioner(s) and myself.  I personally evaluated the patient during the encounter.  EKG Interpretation    Date/Time:  Wednesday July 17 2013 19:05:05 EST Ventricular Rate:  87 PR Interval:  106 QRS Duration: 88 QT Interval:  395 QTC Calculation: 475 R Axis:   26 Text Interpretation:  Sinus rhythm Atrial premature complex Short PR interval Low voltage, precordial leads Abnormal R-wave progression, early transition Minimal ST depression, anterolateral leads Minimal ST elevation, inferior leads Poor data quality in current ECG precludes serial comparison Confirmed by Izzy Courville  MD-J, Jovian Lembcke ([id number]) on 07/17/2013 7:08:22 PM            CT scan is concerning for possible perforated duodenal ulcer.  Discussed findings with patient and family.  Pt has been having this trouble for months with it acutely worsening.  Stable at this time.  Plan on general surgery consultation and admission.   Kathalene Frames, MD 07/17/13 2106

## 2013-07-17 NOTE — Consult Note (Signed)
Reason for Consult:abd pain Referring Physician: Dr Kem Boroughs is an 63 y.o. male.  HPI: Patient is a 63 yo white male who presents today with complaints of constipation, vomiting and abd pain. This has been going on intermittently for about 4 months. These episodes are accompanied by lower right back pain that radiates throughout lower back over time and eventually settles into lower abdomen and pelvis. Pt has not had a BM for the past 4 days. Currently has pain in his lower abdomen. Pain worsens with movement. Experienced episodes of vomiting today, but denies blood in vomit. Denies family hx of cancer. 50 pack yr hx of smoking, consumes roughly 6 oz of liquor daily for 40 years, and hx of marijuana use, but not recently. Denies recent surgeries or hospitalizations. Denies recent medication changes. Denies fever, vision or hearing changes, chest pain, shortness of breath, lightheadedness, weight changes, night sweats. Denies any recent trauma.    Past Medical History  Diagnosis Date  . HYPERTENSION 07/31/2009  . Seizures   . Degenerative joint disease of low back   . Gait disorder   . Neuropathy   . Complex regional pain syndrome of left lower extremity   . Osteopenia   . Memory disorder   . Right humeral fracture   . Essential tremor   . Alcohol abuse     Past Surgical History  Procedure Laterality Date  . Foot surgery      2 occasions  . Shoulder surgery Right   . Vasectomy    . Bladder leision    . Bunionectomy    . Lumbar spine surgery      2 occasions for left L5 radiculopathy  . Cervical spine surgery  08/2010    C5-6-7  . Spinal cord stimulator implant  02/2011    Family History  Problem Relation Age of Onset  . Colon cancer Father   . Heart disease Brother     Social History:  reports that he has been smoking Cigarettes.  He has been smoking about 1.00 pack per day. He does not have any smokeless tobacco history on file. He reports that he drinks  alcohol. He reports that he does not use illicit drugs.  Allergies:  Allergies  Allergen Reactions  . Lyrica [Pregabalin]     Hallucinations.    . Sulfonamide Derivatives     REACTION: unknown as a child  . Valsartan     REACTION: lightheaded    Medications: I have reviewed the patient's current medications.  Results for orders placed during the hospital encounter of 07/17/13 (from the past 48 hour(s))  OCCULT BLOOD, POC DEVICE     Status: None   Collection Time    07/17/13  6:50 PM      Result Value Range   Fecal Occult Bld NEGATIVE  NEGATIVE  CBC WITH DIFFERENTIAL     Status: Abnormal   Collection Time    07/17/13  7:20 PM      Result Value Range   WBC 22.0 (*) 4.0 - 10.5 K/uL   RBC 4.11 (*) 4.22 - 5.81 MIL/uL   Hemoglobin 15.7  13.0 - 17.0 g/dL   HCT 44.5  39.0 - 52.0 %   MCV 108.3 (*) 78.0 - 100.0 fL   MCH 38.2 (*) 26.0 - 34.0 pg   MCHC 35.3  30.0 - 36.0 g/dL   RDW 14.2  11.5 - 15.5 %   Platelets 271  150 - 400 K/uL   Neutrophils  Relative % 84 (*) 43 - 77 %   Neutro Abs 18.3 (*) 1.7 - 7.7 K/uL   Lymphocytes Relative 10 (*) 12 - 46 %   Lymphs Abs 2.2  0.7 - 4.0 K/uL   Monocytes Relative 6  3 - 12 %   Monocytes Absolute 1.4 (*) 0.1 - 1.0 K/uL   Eosinophils Relative 0  0 - 5 %   Eosinophils Absolute 0.0  0.0 - 0.7 K/uL   Basophils Relative 0  0 - 1 %   Basophils Absolute 0.0  0.0 - 0.1 K/uL  HEPATIC FUNCTION PANEL     Status: Abnormal   Collection Time    07/17/13  7:20 PM      Result Value Range   Total Protein 6.3  6.0 - 8.3 g/dL   Albumin 2.9 (*) 3.5 - 5.2 g/dL   AST 13  0 - 37 U/L   ALT 6  0 - 53 U/L   Alkaline Phosphatase 81  39 - 117 U/L   Total Bilirubin 0.9  0.3 - 1.2 mg/dL   Bilirubin, Direct 0.2  0.0 - 0.3 mg/dL   Indirect Bilirubin 0.7  0.3 - 0.9 mg/dL  LIPASE, BLOOD     Status: Abnormal   Collection Time    07/17/13  7:20 PM      Result Value Range   Lipase 103 (*) 11 - 59 U/L  POCT I-STAT TROPONIN I     Status: None   Collection Time     07/17/13  7:36 PM      Result Value Range   Troponin i, poc 0.02  0.00 - 0.08 ng/mL   Comment 3            Comment: Due to the release kinetics of cTnI,     a negative result within the first hours     of the onset of symptoms does not rule out     myocardial infarction with certainty.     If myocardial infarction is still suspected,     repeat the test at appropriate intervals.  POCT I-STAT, CHEM 8     Status: Abnormal   Collection Time    07/17/13  7:39 PM      Result Value Range   Sodium 136 (*) 137 - 147 mEq/L   Potassium 4.1  3.7 - 5.3 mEq/L   Chloride 95 (*) 96 - 112 mEq/L   BUN 19  6 - 23 mg/dL   Creatinine, Ser 0.90  0.50 - 1.35 mg/dL   Glucose, Bld 104 (*) 70 - 99 mg/dL   Calcium, Ion 1.17  1.13 - 1.30 mmol/L   TCO2 31  0 - 100 mmol/L   Hemoglobin 17.7 (*) 13.0 - 17.0 g/dL   HCT 52.0  39.0 - 52.0 %  URINALYSIS, ROUTINE W REFLEX MICROSCOPIC     Status: Abnormal   Collection Time    07/17/13  8:23 PM      Result Value Range   Color, Urine ORANGE (*) YELLOW   Comment: BIOCHEMICALS MAY BE AFFECTED BY COLOR   APPearance CLOUDY (*) CLEAR   Specific Gravity, Urine 1.037 (*) 1.005 - 1.030   pH 5.5  5.0 - 8.0   Glucose, UA NEGATIVE  NEGATIVE mg/dL   Hgb urine dipstick NEGATIVE  NEGATIVE   Bilirubin Urine SMALL (*) NEGATIVE   Ketones, ur NEGATIVE  NEGATIVE mg/dL   Protein, ur 30 (*) NEGATIVE mg/dL   Urobilinogen, UA 1.0  0.0 - 1.0  mg/dL   Nitrite NEGATIVE  NEGATIVE   Leukocytes, UA SMALL (*) NEGATIVE  URINE MICROSCOPIC-ADD ON     Status: Abnormal   Collection Time    07/17/13  8:23 PM      Result Value Range   Squamous Epithelial / LPF RARE  RARE   WBC, UA 0-2  <3 WBC/hpf   Bacteria, UA RARE  RARE   Casts HYALINE CASTS (*) NEGATIVE   Urine-Other MUCOUS PRESENT    CG4 I-STAT (LACTIC ACID)     Status: Abnormal   Collection Time    07/17/13  9:44 PM      Result Value Range   Lactic Acid, Venous 3.91 (*) 0.5 - 2.2 mmol/L    Ct Abdomen Pelvis W Contrast  07/17/2013    CLINICAL DATA:  Lower abdominal pain  EXAM: CT ABDOMEN AND PELVIS WITH CONTRAST  TECHNIQUE: Multidetector CT imaging of the abdomen and pelvis was performed using the standard protocol following bolus administration of intravenous contrast.  CONTRAST:  140mL OMNIPAQUE IOHEXOL 300 MG/ML  SOLN  COMPARISON:  05/29/2007  FINDINGS: Spinal cord stimulator is in place extending from the upper lumbar spine at canal into the thoracic posterior spinal canal.  Nonspecific hypodensities are present within the liver. The liver is somewhat cirrhotic. The largest is towards the dome measuring 1.0 cm. They are most likely cysts.  Small gallstone is present. No obvious inflammatory changes of the gallbladder.  There is a significant inflammatory process occurring within the mesenteric. The inflammatory process relatively spares the pancreas. There is some stranding towards the tail of the pancreas. The inflammatory process is most intimately associated with the 3rd and 4th portions of the duodenum. This is associated with wall thickening. There is also a possible defect within the wall of the duodenum. See image 54 of series 4 on the coronal reconstructions.  There is a complex cystic mass within the uncinate process of the pancreas measuring 2.8 x 2.7 cm. Fluid density is seen extending from the duodenum and uncinate process into the mesenteric.  There is a complex fluid and soft tissue mass within the mesentery measuring 3.6 x 6.8 cm on image 44. This may represent phlegmon of or necrotic adenopathy. Similar densities with some fat density are seen in the posterior right perinephric space. There is a discoid fluid collection superior to the right kidney in the retroperitoneum measuring 2.8 x 5.5 cm.  Of note, there is no definitive free intraperitoneal gas.  Adrenal glands are within normal limits. Simple cysts in both kidneys.  Sigmoid diverticulosis without definite acute diverticulitis. Bladder is within normal limits.   Degenerative and postoperative changes in the lumbar spine.  IMPRESSION: There is a prominent inflammatory process within the mesenteries of the abdomen as described above. The most critical finding is inflammatory changes intimately associated with the distal duodenum an a possible defect within the duodenum. There is no definite extraperitoneal or intraperitoneal gas however perforation cannot be excluded. There is relative sparing of the inflammatory process to the pancreas.  Other intra-abdominal fluid collections as described.  Complex mass within the mesentery which may represent adenopathy or an inflammatory phlegmon. Similar densities posterior to the right kidney.  Cholelithiasis.  Nonspecific hypodensities in the liver. Because the liver is somewhat cirrhotic. The patient is at risk for liver malignancy. Follow-up outpatient MRI is recommended.   Electronically Signed   By: Maryclare Bean M.D.   On: 07/17/2013 20:22   Dg Abd Acute W/chest  07/17/2013   CLINICAL  DATA:  Constipation, nausea, vomiting, rectal bleeding, history hypertension  EXAM: ACUTE ABDOMEN SERIES (ABDOMEN 2 VIEW & CHEST 1 VIEW)  COMPARISON:  Chest radiographs 08/13/2010  FINDINGS: Rotated on chest radiograph to the left.  Normal heart size, mediastinal contours, and pulmonary vascularity.  Lungs appear emphysematous but clear.  No pleural effusion or pneumothorax.  Prior cervical spine fusion.  Intraspinal stimulator leads noted.  Bones diffusely demineralized.  Normal bowel gas pattern.  No bowel dilatation or bowel wall thickening or free intraperitoneal air.  Scattered atherosclerotic calcifications.  No definite urinary tract calcification.  IMPRESSION: Question COPD.  No acute abdominal findings.   Electronically Signed   By: Lavonia Dana M.D.   On: 07/17/2013 20:00    Review of Systems  Constitutional: Positive for fever. Negative for chills and weight loss.  Respiratory: Negative for cough and shortness of breath.    Cardiovascular: Negative for chest pain.  Gastrointestinal: Positive for nausea, vomiting, abdominal pain, constipation and blood in stool. Negative for diarrhea.  Genitourinary: Negative for dysuria, urgency and frequency.  Musculoskeletal: Negative for myalgias.  Neurological: Negative for dizziness and headaches.   Blood pressure 156/102, pulse 118, temperature 100.2 F (37.9 C), temperature source Rectal, resp. rate 16, SpO2 98.00%. Physical Exam  Constitutional: He is oriented to person, place, and time. He appears well-developed and well-nourished. No distress.  HENT:  Head: Normocephalic and atraumatic.  Eyes: Conjunctivae are normal. Pupils are equal, round, and reactive to light.  Neck: Normal range of motion.  Cardiovascular: Normal rate and regular rhythm.   Respiratory: Effort normal and breath sounds normal. No respiratory distress.  GI: Soft. He exhibits no distension and no mass. There is tenderness. There is no rebound and no guarding.  Pt has RUQ tenderness to palpation  Musculoskeletal: Normal range of motion.  Neurological: He is alert and oriented to person, place, and time.  Skin: Skin is warm and dry. He is not diaphoretic.   CT IMPRESSION:  There is a prominent inflammatory process within the mesenteries of the abdomen as described above. The most critical finding is inflammatory changes intimately associated with the distal duodenum an a possible defect within the duodenum. There is no definite extraperitoneal or intraperitoneal gas however perforation cannot be excluded. There is relative sparing of the inflammatory process to the pancreas.  Other intra-abdominal fluid collections as described. Complex mass within the mesentery which may represent adenopathy or an inflammatory phlegmon. Similar densities posterior to the right kidney.  Cholelithiasis.  Nonspecific hypodensities in the liver. Because the liver is somewhat cirrhotic. The patient is at risk for liver  malignancy.  Follow-up outpatient MRI is recommended.  Assessment/Plan: Pt has CT concerning for impending duodenal ulcer perforation.  Recommend NPO, aggressive hydration, no NG for now, IV Zosyn, Protonix gtt, serial labs and exams, monitor vitals and UOP closely, place foley.  Will follow.  Nikash Mortensen C. 08/14/2681, 41:96 PM

## 2013-07-18 ENCOUNTER — Encounter (HOSPITAL_COMMUNITY): Payer: Self-pay

## 2013-07-18 DIAGNOSIS — K269 Duodenal ulcer, unspecified as acute or chronic, without hemorrhage or perforation: Secondary | ICD-10-CM | POA: Diagnosis not present

## 2013-07-18 DIAGNOSIS — I10 Essential (primary) hypertension: Secondary | ICD-10-CM | POA: Diagnosis not present

## 2013-07-18 DIAGNOSIS — R109 Unspecified abdominal pain: Secondary | ICD-10-CM

## 2013-07-18 DIAGNOSIS — K271 Acute peptic ulcer, site unspecified, with perforation: Secondary | ICD-10-CM

## 2013-07-18 DIAGNOSIS — F1010 Alcohol abuse, uncomplicated: Secondary | ICD-10-CM

## 2013-07-18 DIAGNOSIS — J449 Chronic obstructive pulmonary disease, unspecified: Secondary | ICD-10-CM | POA: Diagnosis not present

## 2013-07-18 LAB — COMPREHENSIVE METABOLIC PANEL
ALT: <5 U/L (ref 0–53)
AST: 11 U/L (ref 0–37)
Albumin: 2 g/dL — ABNORMAL LOW (ref 3.5–5.2)
Alkaline Phosphatase: 59 U/L (ref 39–117)
BUN: 12 mg/dL (ref 6–23)
CO2: 26 mEq/L (ref 19–32)
Calcium: 7.7 mg/dL — ABNORMAL LOW (ref 8.4–10.5)
Chloride: 102 mEq/L (ref 96–112)
Creatinine, Ser: 0.73 mg/dL (ref 0.50–1.35)
GFR calc Af Amer: >90 mL/min (ref >90)
GFR calc non Af Amer: >90 mL/min (ref >90)
Glucose, Bld: 83 mg/dL (ref 70–99)
Potassium: 4.4 mEq/L (ref 3.7–5.3)
Sodium: 136 mEq/L — ABNORMAL LOW (ref 137–147)
Total Bilirubin: 0.7 mg/dL (ref 0.3–1.2)
Total Protein: 4.4 g/dL — ABNORMAL LOW (ref 6.0–8.3)

## 2013-07-18 LAB — CBC
HCT: 36.7 % — ABNORMAL LOW (ref 39.0–52.0)
Hemoglobin: 12.4 g/dL — ABNORMAL LOW (ref 13.0–17.0)
MCH: 37 pg — ABNORMAL HIGH (ref 26.0–34.0)
MCHC: 33.8 g/dL (ref 30.0–36.0)
MCV: 109.6 fL — ABNORMAL HIGH (ref 78.0–100.0)
Platelets: 208 10*3/uL (ref 150–400)
RBC: 3.35 MIL/uL — ABNORMAL LOW (ref 4.22–5.81)
RDW: 14.3 % (ref 11.5–15.5)
WBC: 13.5 10*3/uL — ABNORMAL HIGH (ref 4.0–10.5)

## 2013-07-18 LAB — GLUCOSE, CAPILLARY
Glucose-Capillary: 101 mg/dL — ABNORMAL HIGH (ref 70–99)
Glucose-Capillary: 62 mg/dL — ABNORMAL LOW (ref 70–99)
Glucose-Capillary: 69 mg/dL — ABNORMAL LOW (ref 70–99)
Glucose-Capillary: 73 mg/dL (ref 70–99)
Glucose-Capillary: 97 mg/dL (ref 70–99)

## 2013-07-18 LAB — PROTIME-INR
INR: 1.19 (ref 0.00–1.49)
Prothrombin Time: 14.8 seconds (ref 11.6–15.2)

## 2013-07-18 LAB — MRSA PCR SCREENING: MRSA by PCR: NEGATIVE

## 2013-07-18 MED ORDER — DEXTROSE 50 % IV SOLN
INTRAVENOUS | Status: AC
  Administered 2013-07-18 (×2): 25 mL
  Filled 2013-07-18: qty 50

## 2013-07-18 MED ORDER — DEXTROSE-NACL 5-0.9 % IV SOLN
INTRAVENOUS | Status: DC
  Administered 2013-07-19: 13:00:00 via INTRAVENOUS
  Administered 2013-07-19 – 2013-07-21 (×2): 1000 mL via INTRAVENOUS

## 2013-07-18 MED ORDER — INFLUENZA VAC SPLIT QUAD 0.5 ML IM SUSP
0.5000 mL | INTRAMUSCULAR | Status: AC
  Administered 2013-07-19: 0.5 mL via INTRAMUSCULAR
  Filled 2013-07-18 (×2): qty 0.5

## 2013-07-18 NOTE — Progress Notes (Signed)
CARE MANAGEMENT NOTE 07/18/2013  Patient:  Jeffrey Castillo, Jeffrey Castillo   Account Number:  [id number]  Date Initiated:  07/18/2013  Documentation initiated by:  DAVIS,RHONDA  Subjective/Objective Assessment:   pt with hx of seizures and etoh abuse admitted due to abd pain and impending perforation of the duodenum.     Action/Plan:   tbd due to etoh abuse and high risk of w/d and seizures.   Anticipated DC Date:  07/21/2013   Anticipated DC Plan:  HOME/SELF CARE  In-house referral  NA      DC Planning Services  NA      [facility] Choice  NA   Choice offered to / List presented to:  NA   DME arranged  NA      DME agency  NA     [facility] arranged  NA      [place] [facility] agency  NA   Status of service:  In process, will continue to follow Medicare Important Message given?  NA - LOS <3 / Initial given by admissions (If response is "NO", the following Medicare IM given date fields will be blank) Date Medicare IM given:   Date Additional Medicare IM given:    Discharge Disposition:    Per UR Regulation:  Reviewed for med. necessity/level of care/duration of stay  If discussed at [place] of Stay Meetings, dates discussed:    Comments:  [id number]/Rhonda Eldridge Dace, BSN, [place] [phone] Chart Reviewed for discharge and hospital needs. Discharge needs at time of review:  None present will follow for needs. Review of patient progress due on [id number].

## 2013-07-18 NOTE — Progress Notes (Signed)
Hypoglycemic Event  CBG: 67  Treatment: D50 IV 25 mL  Symptoms: None  Follow-up CBG: Time:23:45 CBG Result:106  Possible Reasons for Event: Inadequate meal intake  Comments/MD notified:L [place]    Gilliam Hawkes Ann  Remember to initiate Hypoglycemia Order Set & complete

## 2013-07-18 NOTE — Progress Notes (Signed)
Subjective: Sleeping well no complaint, abdomen is not really tender this Am, just sore.  Objective: Vital signs in last 24 hours: Temp:  [99.3 F (37.4 C)-100.2 F (37.9 C)] 100.2 F (37.9 C) (01/07 2032) Pulse Rate:  [67-118] 68 (01/08 0700) Resp:  [15-21] 17 (01/08 0700) BP: (100-156)/(58-102) 108/67 mmHg (01/08 0700) SpO2:  [93 %-100 %] 99 % (01/08 0700) Weight:  [69.1 kg (152 lb 5.4 oz)] 69.1 kg (152 lb 5.4 oz) (01/08 0224)  825 urine output so far. BP and HR are stable,  Labs stable, WBC is better, H/H is down some INR is normal Intake/Output from previous day: 01/07 0701 - 01/08 0700 In: 525.8 [I.V.:513.3; IV Piggyback:12.5] Out: 825 [Urine:825] Intake/Output this shift:    General appearance: alert, cooperative and no distress GI: soft, he's says it's sore if you press it, but not really hurting.  no distension, + BS.  Lab Results:   Recent Labs  07/17/13 1920 07/17/13 1939 07/18/13 0340  WBC 22.0*  --  13.5*  HGB 15.7 17.7* 12.4*  HCT 44.5 52.0 36.7*  PLT 271  --  208    BMET  Recent Labs  07/17/13 1939 07/18/13 0340  NA 136* 136*  K 4.1 4.4  CL 95* 102  CO2  --  26  GLUCOSE 104* 83  BUN 19 12  CREATININE 0.90 0.73  CALCIUM  --  7.7*   PT/INR  Recent Labs  07/18/13 0340  LABPROT 14.8  INR 1.19     Recent Labs Lab 07/17/13 1920 07/18/13 0340  AST 13 11  ALT 6 <5  ALKPHOS 81 59  BILITOT 0.9 0.7  PROT 6.3 4.4*  ALBUMIN 2.9* 2.0*     Lipase     Component Value Date/Time   LIPASE 103* 07/17/2013 1920     Studies/Results: Ct Abdomen Pelvis W Contrast  07/17/2013   CLINICAL DATA:  Lower abdominal pain  EXAM: CT ABDOMEN AND PELVIS WITH CONTRAST  TECHNIQUE: Multidetector CT imaging of the abdomen and pelvis was performed using the standard protocol following bolus administration of intravenous contrast.  CONTRAST:  197mL OMNIPAQUE IOHEXOL 300 MG/ML  SOLN  COMPARISON:  05/29/2007  FINDINGS: Spinal cord stimulator is in place  extending from the upper lumbar spine at canal into the thoracic posterior spinal canal.  Nonspecific hypodensities are present within the liver. The liver is somewhat cirrhotic. The largest is towards the dome measuring 1.0 cm. They are most likely cysts.  Small gallstone is present. No obvious inflammatory changes of the gallbladder.  There is a significant inflammatory process occurring within the mesenteric. The inflammatory process relatively spares the pancreas. There is some stranding towards the tail of the pancreas. The inflammatory process is most intimately associated with the 3rd and 4th portions of the duodenum. This is associated with wall thickening. There is also a possible defect within the wall of the duodenum. See image 54 of series 4 on the coronal reconstructions.  There is a complex cystic mass within the uncinate process of the pancreas measuring 2.8 x 2.7 cm. Fluid density is seen extending from the duodenum and uncinate process into the mesenteric.  There is a complex fluid and soft tissue mass within the mesentery measuring 3.6 x 6.8 cm on image 44. This may represent phlegmon of or necrotic adenopathy. Similar densities with some fat density are seen in the posterior right perinephric space. There is a discoid fluid collection superior to the right kidney in the retroperitoneum measuring 2.8 x 5.5  cm.  Of note, there is no definitive free intraperitoneal gas.  Adrenal glands are within normal limits. Simple cysts in both kidneys.  Sigmoid diverticulosis without definite acute diverticulitis. Bladder is within normal limits.  Degenerative and postoperative changes in the lumbar spine.  IMPRESSION: There is a prominent inflammatory process within the mesenteries of the abdomen as described above. The most critical finding is inflammatory changes intimately associated with the distal duodenum an a possible defect within the duodenum. There is no definite extraperitoneal or intraperitoneal gas  however perforation cannot be excluded. There is relative sparing of the inflammatory process to the pancreas.  Other intra-abdominal fluid collections as described.  Complex mass within the mesentery which may represent adenopathy or an inflammatory phlegmon. Similar densities posterior to the right kidney.  Cholelithiasis.  Nonspecific hypodensities in the liver. Because the liver is somewhat cirrhotic. The patient is at risk for liver malignancy. Follow-up outpatient MRI is recommended.   Electronically Signed   By: Maryclare Bean M.D.   On: 07/17/2013 20:22   Dg Abd Acute W/chest  07/17/2013   CLINICAL DATA:  Constipation, nausea, vomiting, rectal bleeding, history hypertension  EXAM: ACUTE ABDOMEN SERIES (ABDOMEN 2 VIEW & CHEST 1 VIEW)  COMPARISON:  Chest radiographs 08/13/2010  FINDINGS: Rotated on chest radiograph to the left.  Normal heart size, mediastinal contours, and pulmonary vascularity.  Lungs appear emphysematous but clear.  No pleural effusion or pneumothorax.  Prior cervical spine fusion.  Intraspinal stimulator leads noted.  Bones diffusely demineralized.  Normal bowel gas pattern.  No bowel dilatation or bowel wall thickening or free intraperitoneal air.  Scattered atherosclerotic calcifications.  No definite urinary tract calcification.  IMPRESSION: Question COPD.  No acute abdominal findings.   Electronically Signed   By: Lavonia Dana M.D.   On: 07/17/2013 20:00    Medications: . heparin  5,000 Units Subcutaneous Q8H  . levETIRAcetam  750 mg Intravenous Q12H  . LORazepam  0-4 mg Intravenous Q6H   Followed by  . [START ON 07/19/2013] LORazepam  0-4 mg Intravenous Q12H  . nicotine  14 mg Transdermal Daily  . [START ON 07/21/2013] pantoprazole (PROTONIX) IV  40 mg Intravenous Q12H  . piperacillin-tazobactam (ZOSYN)  IV  3.375 g Intravenous Q8H  . sodium chloride  3 mL Intravenous Q12H    Assessment/Plan 1.  Constipation, nausea and vomiting 2.  Impending duodenal perforation  (inflammatory process 3rd and 4th  Portions of the duodenum, possible  defect wall of duodenum, fluid/soft tissue complex mesentery-phlegmon vs necrotic tissue 3.6 x 6.8 cm. 3.  Heavy ETOH use 4.  Tobacco use 5.  Hypertension 6.  Hx of neuropathy/gait disorder 7.  Memory disorder 8.  Hx of seizures/essential tremors 9.  Hx of lumbar/cervical surgery with spina cord stimulator implant 2012   Plan:  Continue to follow with you, Dr. Carles Collet would like [place] to consider IR evaluation of phlegmon, I will discuss with Dr. Marlou Starks.     LOS: 1 day    Gurneet Matarese 07/18/2013

## 2013-07-18 NOTE — Progress Notes (Signed)
Hypoglycemic Event  CBG: 69  Treatment: D50 IV 25 mL  Symptoms: None  Follow-up CBG: Time:1855 CBG Result:101  Possible Reasons for Event: Inadequate meal intake  Comments/MD notified:Pt has been NPO since admit last night and been vomiting last few days.    Gladys Damme C  Remember to initiate Hypoglycemia Order Set & complete

## 2013-07-18 NOTE — Progress Notes (Signed)
[facility] PROGRESS NOTE  Jeffrey Castillo GEX:[id number] DOB: [birth year] DOA: 07/17/2013 PCP: Rozanna Box, MD  Assessment/Plan: Duodenal with concern of duodenal perforation -Appreciate general surgery consultation and followup -Continue PPI -Continue Zosyn -Continue IV fluids -07/17/2013 CT abdomen showed mesenteric inflammatory changes in the duodenum with wall thickening and possible wall defect Mesenteric fluid collection/soft tissue mass -Discussed with surgery to determine if patient needs IR biopsy -Continue antibiotics for now -Concerned about abscess versus malignancy Right retroperitoneal discord fluid collection -May need IR fluid drainage -Continue antibiotics for now until patient is stable Alcohol abuse -Alcohol withdrawal protocol Hypertension -Continue clonidine patch -Monitor blood pressure closely -Discontinuation may lead to rebound hypertension and tachycardia Tobacco abuse -Nicoderm patch COPD -Stable presently -Aerosolized albuterol when necessary Seizure disorder -Keppra increased from 500 to 750mg   twice a day -Restart Lamictal 1 the patient is able to tolerate po   Family Communication:   Pt at beside Disposition Plan:   Home when medically stable   Antibiotics:  Zosyn 07/17/2013>>>    Procedures/Studies: Ct Abdomen Pelvis W Contrast  07/17/2013   CLINICAL DATA:  Lower abdominal pain  EXAM: CT ABDOMEN AND PELVIS WITH CONTRAST  TECHNIQUE: Multidetector CT imaging of the abdomen and pelvis was performed using the standard protocol following bolus administration of intravenous contrast.  CONTRAST:  OMNIPAQUE IOHEXOL 300 MG/ML  SOLN  COMPARISON:  05/29/2007  FINDINGS: Spinal cord stimulator is in place extending from the upper lumbar spine at canal into the thoracic posterior spinal canal.  Nonspecific hypodensities are present within the liver. The liver is somewhat cirrhotic. The largest is towards the dome measuring 1.0 cm. They are  most likely cysts.  Small gallstone is present. No obvious inflammatory changes of the gallbladder.  There is a significant inflammatory process occurring within the mesenteric. The inflammatory process relatively spares the pancreas. There is some stranding towards the tail of the pancreas. The inflammatory process is most intimately associated with the 3rd and 4th portions of the duodenum. This is associated with wall thickening. There is also a possible defect within the wall of the duodenum. See image 54 of series 4 on the coronal reconstructions.  There is a complex cystic mass within the uncinate process of the pancreas measuring 2.8 x 2.7 cm. Fluid density is seen extending from the duodenum and uncinate process into the mesenteric.  There is a complex fluid and soft tissue mass within the mesentery measuring 3.6 x 6.8 cm on image 44. This may represent phlegmon of or necrotic adenopathy. Similar densities with some fat density are seen in the posterior right perinephric space. There is a discoid fluid collection superior to the right kidney in the retroperitoneum measuring 2.8 x 5.5 cm.  Of note, there is no definitive free intraperitoneal gas.  Adrenal glands are within normal limits. Simple cysts in both kidneys.  Sigmoid diverticulosis without definite acute diverticulitis. Bladder is within normal limits.  Degenerative and postoperative changes in the lumbar spine.  IMPRESSION: There is a prominent inflammatory process within the mesenteries of the abdomen as described above. The most critical finding is inflammatory changes intimately associated with the distal duodenum an a possible defect within the duodenum. There is no definite extraperitoneal or intraperitoneal gas however perforation cannot be excluded. There is relative sparing of the inflammatory process to the pancreas.  Other intra-abdominal fluid collections as described.  Complex mass within the mesentery which may represent adenopathy or an  inflammatory phlegmon. Similar densities posterior to the right kidney.  Cholelithiasis.  Nonspecific hypodensities in the liver. Because the liver is somewhat cirrhotic. The patient is at risk for liver malignancy. Follow-up outpatient MRI is recommended.   Electronically Signed   By: Maryclare Bean M.D.   On: 07/17/2013 20:22   US Venous Img Lower Unilateral Right  07/01/2013   CLINICAL DATA:  Left leg pain  EXAM: Left LOWER EXTREMITY VENOUS DOPPLER ULTRASOUND  TECHNIQUE: Gray-scale sonography with graded compression, as well as color Doppler and duplex ultrasound, were performed to evaluate the deep venous system from the level of the common femoral vein through the popliteal and proximal calf veins. Spectral Doppler was utilized to evaluate flow at rest and with distal augmentation maneuvers.  COMPARISON:  None.  FINDINGS: Thrombus within deep veins:  None visualized.  Compressibility of deep veins:  Normal.  Duplex waveform respiratory phasicity:  Normal.  Duplex waveform response to augmentation:  Normal.  Venous reflux:  None visualized.  Other findings:  None visualized.  IMPRESSION: No acute abnormality is noted.  No deep venous thrombosis is seen.   Electronically Signed   By: Alcide Clever M.D.   On: 07/01/2013 16:24   Dg Abd Acute W/chest  07/17/2013   CLINICAL DATA:  Constipation, nausea, vomiting, rectal bleeding, history hypertension  EXAM: ACUTE ABDOMEN SERIES (ABDOMEN 2 VIEW & CHEST 1 VIEW)  COMPARISON:  Chest radiographs 08/13/2010  FINDINGS: Rotated on chest radiograph to the left.  Normal heart size, mediastinal contours, and pulmonary vascularity.  Lungs appear emphysematous but clear.  No pleural effusion or pneumothorax.  Prior cervical spine fusion.  Intraspinal stimulator leads noted.  Bones diffusely demineralized.  Normal bowel gas pattern.  No bowel dilatation or bowel wall thickening or free intraperitoneal air.  Scattered atherosclerotic calcifications.  No definite urinary tract  calcification.  IMPRESSION: Question COPD.  No acute abdominal findings.   Electronically Signed   By: Ulyses Southward M.D.   On: 07/17/2013 20:00         Subjective: Patient denies headache, dizziness, chest pain, shortness breath, vomiting, diarrhea, dysuria. Abdominal pain is somewhat better.  Objective: Filed Vitals:   07/18/13 0400 07/18/13 0500 07/18/13 0600 07/18/13 0700  BP: 102/65 100/58 111/79 108/67  Pulse: 67 70 73 68  Temp:      TempSrc:      Resp: 21 15 16 17   Height:      Weight:      SpO2: 97% 97% 100% 99%    Intake/Output Summary (Last 24 hours) at 07/18/13 0825 Last data filed at 07/18/13 0700  Gross per 24 hour  Intake 525.83 ml  Output    825 ml  Net -299.17 ml   Weight change:  Exam:   General:  Pt is alert, follows commands appropriately, not in acute distress  HEENT: No icterus, No thrush,  [place]/AT  Cardiovascular: RRR, S1/S2, no rubs, no gallops  Respiratory: Bibasilar crackles. No wheezing. Good air movement.  Abdomen: Soft/+BS, epigastric and periumbilical tenderness without any rebound non distended, no guarding  Extremities: No edema, No lymphangitis, No petechiae, No rashes, no synovitis  Data Reviewed: Basic Metabolic Panel:  Recent Labs Lab 07/17/13 1939 07/18/13 0340  NA 136* 136*  K 4.1 4.4  CL 95* 102  CO2  --  26  GLUCOSE 104* 83  BUN 19 12  CREATININE 0.90 0.73  CALCIUM  --  7.7*   Liver Function Tests:  Recent Labs Lab 07/17/13 1920 07/18/13 0340  AST 13 11  ALT 6 <5  ALKPHOS  81 59  BILITOT 0.9 0.7  PROT 6.3 4.4*  ALBUMIN 2.9* 2.0*    Recent Labs Lab 07/17/13 1920  LIPASE 103*   No results found for this basename: AMMONIA,  in the last 168 hours CBC:  Recent Labs Lab 07/17/13 1920 07/17/13 1939 07/18/13 0340  WBC 22.0*  --  13.5*  NEUTROABS 18.3*  --   --   HGB 15.7 17.7* 12.4*  HCT 44.5 52.0 36.7*  MCV 108.3*  --  109.6*  PLT 271  --  208   Cardiac Enzymes: No results found for this  basename: CKTOTAL, CKMB, CKMBINDEX, TROPONINI,  in the last 168 hours BNP: No components found with this basename: POCBNP,  CBG:  Recent Labs Lab 07/18/13 0027  GLUCAP 97    Recent Results (from the past 240 hour(s))  MRSA PCR SCREENING     Status: None   Collection Time    07/18/13  2:22 AM      Result Value Range Status   MRSA by PCR NEGATIVE  NEGATIVE Final   Comment:            The GeneXpert MRSA Assay (FDA     approved for NASAL specimens     only), is one component of a     comprehensive MRSA colonization     surveillance program. It is not     intended to diagnose MRSA     infection nor to guide or     monitor treatment for     MRSA infections.     Scheduled Meds: . heparin  5,000 Units Subcutaneous Q8H  . levETIRAcetam  750 mg Intravenous Q12H  . LORazepam  0-4 mg Intravenous Q6H   Followed by  . [START ON 07/19/2013] LORazepam  0-4 mg Intravenous Q12H  . nicotine  14 mg Transdermal Daily  . [START ON 07/21/2013] pantoprazole (PROTONIX) IV  40 mg Intravenous Q12H  . piperacillin-tazobactam (ZOSYN)  IV  3.375 g Intravenous Q8H  . sodium chloride  3 mL Intravenous Q12H   Continuous Infusions: . sodium chloride 100 mL/hr at 07/18/13 0152  . pantoprozole (PROTONIX) infusion 8 mg/hr (07/17/13 2349)     Zakhai Meisinger, DO  [facility] Pager [phone]  If 7PM-7AM, please contact night-coverage [email or web address] Password [email or web address] 07/18/2013, 8:25 AM   LOS: 1 day

## 2013-07-18 NOTE — Progress Notes (Signed)
General Surgery [facility]  Patient seen and examined.  Mild to moderate tenderness upper abdomen.  No distension.  Continue bowel rest, IV hydration, and IV abx.  Will follow closely.  Earnstine Regal, MD, [facility] Office: [phone]

## 2013-07-19 DIAGNOSIS — K265 Chronic or unspecified duodenal ulcer with perforation: Principal | ICD-10-CM

## 2013-07-19 LAB — CBC WITH DIFFERENTIAL/PLATELET
Basophils Absolute: 0 10*3/uL (ref 0.0–0.1)
Basophils Relative: 0 % (ref 0–1)
Eosinophils Absolute: 0.2 10*3/uL (ref 0.0–0.7)
Eosinophils Relative: 1 % (ref 0–5)
HCT: 33.6 % — ABNORMAL LOW (ref 39.0–52.0)
Hemoglobin: 11.6 g/dL — ABNORMAL LOW (ref 13.0–17.0)
Lymphocytes Relative: 17 % (ref 12–46)
Lymphs Abs: 1.9 10*3/uL (ref 0.7–4.0)
MCH: 37.2 pg — ABNORMAL HIGH (ref 26.0–34.0)
MCHC: 34.5 g/dL (ref 30.0–36.0)
MCV: 107.7 fL — ABNORMAL HIGH (ref 78.0–100.0)
Monocytes Absolute: 0.9 10*3/uL (ref 0.1–1.0)
Monocytes Relative: 8 % (ref 3–12)
Neutro Abs: 8.3 10*3/uL — ABNORMAL HIGH (ref 1.7–7.7)
Neutrophils Relative %: 74 % (ref 43–77)
Platelets: 204 10*3/uL (ref 150–400)
RBC: 3.12 MIL/uL — ABNORMAL LOW (ref 4.22–5.81)
RDW: 13.8 % (ref 11.5–15.5)
WBC: 11.3 10*3/uL — ABNORMAL HIGH (ref 4.0–10.5)

## 2013-07-19 LAB — COMPREHENSIVE METABOLIC PANEL
ALT: <5 U/L (ref 0–53)
AST: 12 U/L (ref 0–37)
Albumin: 1.9 g/dL — ABNORMAL LOW (ref 3.5–5.2)
Alkaline Phosphatase: 58 U/L (ref 39–117)
BUN: 7 mg/dL (ref 6–23)
CO2: 23 mEq/L (ref 19–32)
Calcium: 7.7 mg/dL — ABNORMAL LOW (ref 8.4–10.5)
Chloride: 103 mEq/L (ref 96–112)
Creatinine, Ser: 0.69 mg/dL (ref 0.50–1.35)
GFR calc Af Amer: >90 mL/min (ref >90)
GFR calc non Af Amer: >90 mL/min (ref >90)
Glucose, Bld: 85 mg/dL (ref 70–99)
Potassium: 3.5 mEq/L — ABNORMAL LOW (ref 3.7–5.3)
Sodium: 138 mEq/L (ref 137–147)
Total Bilirubin: 0.6 mg/dL (ref 0.3–1.2)
Total Protein: 4.5 g/dL — ABNORMAL LOW (ref 6.0–8.3)

## 2013-07-19 LAB — GLUCOSE, CAPILLARY
Glucose-Capillary: 106 mg/dL — ABNORMAL HIGH (ref 70–99)
Glucose-Capillary: 81 mg/dL (ref 70–99)
Glucose-Capillary: 82 mg/dL (ref 70–99)

## 2013-07-19 MED ORDER — CLONIDINE HCL 0.2 MG/24HR TD PTWK
0.2000 mg | MEDICATED_PATCH | TRANSDERMAL | Status: DC
  Administered 2013-07-19: 0.2 mg via TRANSDERMAL
  Filled 2013-07-19: qty 1

## 2013-07-19 NOTE — Progress Notes (Signed)
[facility] PROGRESS NOTE  JOSHIA ROSER QQV:[id number] DOB: 15-Oct-1950 DOA: 07/17/2013 PCP: Rozanna Box, MD  Assessment/Plan: Duodenal ulcer with concern of duodenal perforation  -Appreciate general surgery consultation and followup  -Continue PPI  -Continue Zosyn  -Continue IV fluids  -07/17/2013 CT abdomen showed mesenteric inflammatory changes in the duodenum with wall thickening and possible wall defect  Mesenteric fluid collection/soft tissue mass  -Discuss with surgery to determine if patient needs IR biopsy of  -Continue antibiotics for now  -Concerned about abscess versus malignancy  -plans noted for UGI study Intra-abdominal fluid collections -?? Phlegmontous collections from pancreatitis -ask general surgery if need to percutaneously drain/biopsy fluid collections Right retroperitoneal discord fluid collection  -May need IR fluid drainage  -Continue antibiotics for now until patient is stable  Alcohol abuse  -Alcohol withdrawal protocol  Hypertension  -d/c clonidine patch--monitor BP -Monitor blood pressure closely   Tobacco abuse  -Nicoderm patch  COPD  -Stable presently  -Aerosolized albuterol when necessary  Seizure disorder  -Keppra increased from 500 to 750mg  twice a day  -Restart Lamictal 1 the patient is able to tolerate po  Family Communication: Pt at beside  Disposition Plan: Home when medically stable  Antibiotics:  Zosyn 07/17/2013>>>         Procedures/Studies: Ct Abdomen Pelvis W Contrast  07/18/2013   ADDENDUM REPORT: 07/18/2013 08:47  ADDENDUM: Lab work today demonstrates a lipase of 103. The above findings may simply represent advanced acute pancreatitis with multiple phlegmon collections. The cystic mass in the head of the pancreas should be followed with the repeat CT in 3 months.   Electronically Signed   By: Maryclare Bean M.D.   On: 07/18/2013 08:47   07/18/2013   CLINICAL DATA:  Lower abdominal pain  EXAM: CT ABDOMEN AND PELVIS  WITH CONTRAST  TECHNIQUE: Multidetector CT imaging of the abdomen and pelvis was performed using the standard protocol following bolus administration of intravenous contrast.  CONTRAST:  OMNIPAQUE IOHEXOL 300 MG/ML  SOLN  COMPARISON:  05/29/2007  FINDINGS: Spinal cord stimulator is in place extending from the upper lumbar spine at canal into the thoracic posterior spinal canal.  Nonspecific hypodensities are present within the liver. The liver is somewhat cirrhotic. The largest is towards the dome measuring 1.0 cm. They are most likely cysts.  Small gallstone is present. No obvious inflammatory changes of the gallbladder.  There is a significant inflammatory process occurring within the mesenteric. The inflammatory process relatively spares the pancreas. There is some stranding towards the tail of the pancreas. The inflammatory process is most intimately associated with the 3rd and 4th portions of the duodenum. This is associated with wall thickening. There is also a possible defect within the wall of the duodenum. See image 54 of series 4 on the coronal reconstructions.  There is a complex cystic mass within the uncinate process of the pancreas measuring 2.8 x 2.7 cm. Fluid density is seen extending from the duodenum and uncinate process into the mesenteric.  There is a complex fluid and soft tissue mass within the mesentery measuring 3.6 x 6.8 cm on image 44. This may represent phlegmon of or necrotic adenopathy. Similar densities with some fat density are seen in the posterior right perinephric space. There is a discoid fluid collection superior to the right kidney in the retroperitoneum measuring 2.8 x 5.5 cm.  Of note, there is no definitive free intraperitoneal gas.  Adrenal glands are within normal limits. Simple cysts in both kidneys.  Sigmoid  diverticulosis without definite acute diverticulitis. Bladder is within normal limits.  Degenerative and postoperative changes in the lumbar spine.  IMPRESSION:  There is a prominent inflammatory process within the mesenteries of the abdomen as described above. The most critical finding is inflammatory changes intimately associated with the distal duodenum an a possible defect within the duodenum. There is no definite extraperitoneal or intraperitoneal gas however perforation cannot be excluded. There is relative sparing of the inflammatory process to the pancreas.  Other intra-abdominal fluid collections as described.  Complex mass within the mesentery which may represent adenopathy or an inflammatory phlegmon. Similar densities posterior to the right kidney.  Cholelithiasis.  Nonspecific hypodensities in the liver. Because the liver is somewhat cirrhotic. The patient is at risk for liver malignancy. Follow-up outpatient MRI is recommended.  Electronically Signed: By: Maryclare Bean M.D. On: 07/17/2013 20:22   US Venous Img Lower Unilateral Right  07/01/2013   CLINICAL DATA:  Left leg pain  EXAM: Left LOWER EXTREMITY VENOUS DOPPLER ULTRASOUND  TECHNIQUE: Gray-scale sonography with graded compression, as well as color Doppler and duplex ultrasound, were performed to evaluate the deep venous system from the level of the common femoral vein through the popliteal and proximal calf veins. Spectral Doppler was utilized to evaluate flow at rest and with distal augmentation maneuvers.  COMPARISON:  None.  FINDINGS: Thrombus within deep veins:  None visualized.  Compressibility of deep veins:  Normal.  Duplex waveform respiratory phasicity:  Normal.  Duplex waveform response to augmentation:  Normal.  Venous reflux:  None visualized.  Other findings:  None visualized.  IMPRESSION: No acute abnormality is noted.  No deep venous thrombosis is seen.   Electronically Signed   By: Alcide Clever M.D.   On: 07/01/2013 16:24   Dg Abd Acute W/chest  07/17/2013   CLINICAL DATA:  Constipation, nausea, vomiting, rectal bleeding, history hypertension  EXAM: ACUTE ABDOMEN SERIES (ABDOMEN 2 VIEW &  CHEST 1 VIEW)  COMPARISON:  Chest radiographs 08/13/2010  FINDINGS: Rotated on chest radiograph to the left.  Normal heart size, mediastinal contours, and pulmonary vascularity.  Lungs appear emphysematous but clear.  No pleural effusion or pneumothorax.  Prior cervical spine fusion.  Intraspinal stimulator leads noted.  Bones diffusely demineralized.  Normal bowel gas pattern.  No bowel dilatation or bowel wall thickening or free intraperitoneal air.  Scattered atherosclerotic calcifications.  No definite urinary tract calcification.  IMPRESSION: Question COPD.  No acute abdominal findings.   Electronically Signed   By: Ulyses Southward M.D.   On: 07/17/2013 20:00         Subjective: Patient is feeling better. Denies fevers, chills, chest discomfort, shortness of breath, nausea, vomiting, diarrhea, dysuria. He feels hungry. Abdominal pain is improving.  Objective: Filed Vitals:   07/19/13 0900 07/19/13 1000 07/19/13 1100 07/19/13 1200  BP: 142/78 151/85 148/69   Pulse: 61 65 66   Temp:    98 F (36.7 C)  TempSrc:    Oral  Resp: 20  23   Height:      Weight:      SpO2: 94%  95%     Intake/Output Summary (Last 24 hours) at 07/19/13 1350 Last data filed at 07/19/13 0523  Gross per 24 hour  Intake 2032.5 ml  Output   1025 ml  Net 1007.5 ml   Weight change: -21.7 kg (-47 lb 13.4 oz) Exam:   General:  Pt is alert, follows commands appropriately, not in acute distress  HEENT: No icterus, No  thrush,[place]/AT  Cardiovascular: RRR, S1/S2, no rubs, no gallops  Respiratory: Diminished breath sounds at the bases without any wheezing or rhonchi. Good air movement.  Abdomen: Soft/+BS, epigastric and left upper quadrant tenderness without any peritoneal signs or rebound tenderness, non distended, no guarding  Extremities: No edema, No lymphangitis, No petechiae, No rashes, no synovitis  Data Reviewed: Basic Metabolic Panel:  Recent Labs Lab 07/17/13 1939 07/18/13 0340 07/19/13 0354   NA 136* 136* 138  K 4.1 4.4 3.5*  CL 95* 102 103  CO2  --  26 23  GLUCOSE 104* 83 85  BUN 19 12 7   CREATININE 0.90 0.73 0.69  CALCIUM  --  7.7* 7.7*   Liver Function Tests:  Recent Labs Lab 07/17/13 1920 07/18/13 0340 07/19/13 0354  AST 13 11 12   ALT 6 <5 <5  ALKPHOS 81 59 58  BILITOT 0.9 0.7 0.6  PROT 6.3 4.4* 4.5*  ALBUMIN 2.9* 2.0* 1.9*    Recent Labs Lab 07/17/13 1920  LIPASE 103*   No results found for this basename: AMMONIA,  in the last 168 hours CBC:  Recent Labs Lab 07/17/13 1920 07/17/13 1939 07/18/13 0340 07/19/13 0354  WBC 22.0*  --  13.5* 11.3*  NEUTROABS 18.3*  --   --  8.3*  HGB 15.7 17.7* 12.4* 11.6*  HCT 44.5 52.0 36.7* 33.6*  MCV 108.3*  --  109.6* 107.7*  PLT 271  --  208 204   Cardiac Enzymes: No results found for this basename: CKTOTAL, CKMB, CKMBINDEX, TROPONINI,  in the last 168 hours BNP: No components found with this basename: POCBNP,  CBG:  Recent Labs Lab 07/18/13 1853 07/18/13 2321 07/18/13 2343 07/19/13 0613 07/19/13 1153  GLUCAP 101* 62* 106* 81 82    Recent Results (from the past 240 hour(s))  MRSA PCR SCREENING     Status: None   Collection Time    07/18/13  2:22 AM      Result Value Range Status   MRSA by PCR NEGATIVE  NEGATIVE Final   Comment:            The GeneXpert MRSA Assay (FDA     approved for NASAL specimens     only), is one component of a     comprehensive MRSA colonization     surveillance program. It is not     intended to diagnose MRSA     infection nor to guide or     monitor treatment for     MRSA infections.     Scheduled Meds: . heparin  5,000 Units Subcutaneous Q8H  . levETIRAcetam  750 mg Intravenous Q12H  . LORazepam  0-4 mg Intravenous Q6H   Followed by  . LORazepam  0-4 mg Intravenous Q12H  . nicotine  14 mg Transdermal Daily  . [START ON 07/21/2013] pantoprazole (PROTONIX) IV  40 mg Intravenous Q12H  . piperacillin-tazobactam (ZOSYN)  IV  3.375 g Intravenous Q8H  . sodium  chloride  3 mL Intravenous Q12H   Continuous Infusions: . dextrose 5 % and 0.9% NaCl 100 mL/hr at 07/19/13 1238  . pantoprozole (PROTONIX) infusion 8 mg/hr (07/19/13 0920)     Taunja Brickner, DO  [facility] Pager [phone]  If 7PM-7AM, please contact night-coverage [email or web address] Password [email or web address] 07/19/2013, 1:50 PM   LOS: 2 days

## 2013-07-19 NOTE — Progress Notes (Signed)
Patient ID: Jeffrey Castillo, male   DOB: Apr 21, 1951, 63 y.o.   MRN: [id number]    Subjective: Pt feels well today.  No abdominal pain.  No nausea  Objective: Vital signs in last 24 hours: Temp:  [98 F (36.7 C)-99.4 F (37.4 C)] 98 F (36.7 C) (01/09 0800) Pulse Rate:  [63-75] 65 (01/09 1000) Resp:  [16-21] 18 (01/09 0528) BP: (126-151)/(59-85) 151/85 mmHg (01/09 1000) SpO2:  [92 %-99 %] 96 % (01/09 0528) Weight:  [104 lb 8 oz (47.4 kg)] 104 lb 8 oz (47.4 kg) (01/09 0528) Last BM Date: 07/14/13  Intake/Output from previous day: 01/08 0701 - 01/09 0700 In: 3112.1 [I.V.:2804.6; IV Piggyback:307.5] Out: 1615 [Urine:1615] Intake/Output this shift:    PE: Abd: soft, minimally tender in upper abdomen, +BS, ND Heart: regular Lungs: CTAB  Lab Results:   Recent Labs  07/18/13 0340 07/19/13 0354  WBC 13.5* 11.3*  HGB 12.4* 11.6*  HCT 36.7* 33.6*  PLT 208 204   BMET  Recent Labs  07/18/13 0340 07/19/13 0354  NA 136* 138  K 4.4 3.5*  CL 102 103  CO2 26 23  GLUCOSE 83 85  BUN 12 7  CREATININE 0.73 0.69  CALCIUM 7.7* 7.7*   PT/INR  Recent Labs  07/18/13 0340  LABPROT 14.8  INR 1.19   CMP     Component Value Date/Time   NA 138 07/19/2013 0354   K 3.5* 07/19/2013 0354   CL 103 07/19/2013 0354   CO2 23 07/19/2013 0354   GLUCOSE 85 07/19/2013 0354   BUN 7 07/19/2013 0354   CREATININE 0.69 07/19/2013 0354   CALCIUM 7.7* 07/19/2013 0354   PROT 4.5* 07/19/2013 0354   ALBUMIN 1.9* 07/19/2013 0354   AST 12 07/19/2013 0354   ALT <5 07/19/2013 0354   ALKPHOS 58 07/19/2013 0354   BILITOT 0.6 07/19/2013 0354   GFRNONAA >90 07/19/2013 0354   GFRAA >90 07/19/2013 0354   Lipase     Component Value Date/Time   LIPASE 103* 07/17/2013 1920       Studies/Results: Ct Abdomen Pelvis W Contrast  07/18/2013   ADDENDUM REPORT: 07/18/2013 08:47  ADDENDUM: Lab work today demonstrates a lipase of 103. The above findings may simply represent advanced acute pancreatitis with multiple phlegmon  collections. The cystic mass in the head of the pancreas should be followed with the repeat CT in 3 months.   Electronically Signed   By: Maryclare Bean M.D.   On: 07/18/2013 08:47   07/18/2013   CLINICAL DATA:  Lower abdominal pain  EXAM: CT ABDOMEN AND PELVIS WITH CONTRAST  TECHNIQUE: Multidetector CT imaging of the abdomen and pelvis was performed using the standard protocol following bolus administration of intravenous contrast.  CONTRAST:  OMNIPAQUE IOHEXOL 300 MG/ML  SOLN  COMPARISON:  05/29/2007  FINDINGS: Spinal cord stimulator is in place extending from the upper lumbar spine at canal into the thoracic posterior spinal canal.  Nonspecific hypodensities are present within the liver. The liver is somewhat cirrhotic. The largest is towards the dome measuring 1.0 cm. They are most likely cysts.  Small gallstone is present. No obvious inflammatory changes of the gallbladder.  There is a significant inflammatory process occurring within the mesenteric. The inflammatory process relatively spares the pancreas. There is some stranding towards the tail of the pancreas. The inflammatory process is most intimately associated with the 3rd and 4th portions of the duodenum. This is associated with wall thickening. There is also a possible defect within  the wall of the duodenum. See image 54 of series 4 on the coronal reconstructions.  There is a complex cystic mass within the uncinate process of the pancreas measuring 2.8 x 2.7 cm. Fluid density is seen extending from the duodenum and uncinate process into the mesenteric.  There is a complex fluid and soft tissue mass within the mesentery measuring 3.6 x 6.8 cm on image 44. This may represent phlegmon of or necrotic adenopathy. Similar densities with some fat density are seen in the posterior right perinephric space. There is a discoid fluid collection superior to the right kidney in the retroperitoneum measuring 2.8 x 5.5 cm.  Of note, there is no definitive free  intraperitoneal gas.  Adrenal glands are within normal limits. Simple cysts in both kidneys.  Sigmoid diverticulosis without definite acute diverticulitis. Bladder is within normal limits.  Degenerative and postoperative changes in the lumbar spine.  IMPRESSION: There is a prominent inflammatory process within the mesenteries of the abdomen as described above. The most critical finding is inflammatory changes intimately associated with the distal duodenum an a possible defect within the duodenum. There is no definite extraperitoneal or intraperitoneal gas however perforation cannot be excluded. There is relative sparing of the inflammatory process to the pancreas.  Other intra-abdominal fluid collections as described.  Complex mass within the mesentery which may represent adenopathy or an inflammatory phlegmon. Similar densities posterior to the right kidney.  Cholelithiasis.  Nonspecific hypodensities in the liver. Because the liver is somewhat cirrhotic. The patient is at risk for liver malignancy. Follow-up outpatient MRI is recommended.  Electronically Signed: By: Maryclare Bean M.D. On: 07/17/2013 20:22   Dg Abd Acute W/chest  07/17/2013   CLINICAL DATA:  Constipation, nausea, vomiting, rectal bleeding, history hypertension  EXAM: ACUTE ABDOMEN SERIES (ABDOMEN 2 VIEW & CHEST 1 VIEW)  COMPARISON:  Chest radiographs 08/13/2010  FINDINGS: Rotated on chest radiograph to the left.  Normal heart size, mediastinal contours, and pulmonary vascularity.  Lungs appear emphysematous but clear.  No pleural effusion or pneumothorax.  Prior cervical spine fusion.  Intraspinal stimulator leads noted.  Bones diffusely demineralized.  Normal bowel gas pattern.  No bowel dilatation or bowel wall thickening or free intraperitoneal air.  Scattered atherosclerotic calcifications.  No definite urinary tract calcification.  IMPRESSION: Question COPD.  No acute abdominal findings.   Electronically Signed   By: Ulyses Southward M.D.   On:  07/17/2013 20:00    Anti-infectives: Anti-infectives   Start     Dose/Rate Route Frequency Ordered Stop   07/18/13 0600  piperacillin-tazobactam (ZOSYN) IVPB 3.375 g     3.375 g 12.5 mL/hr over 240 Minutes Intravenous Every 8 hours 07/17/13 2300     07/17/13 2100  piperacillin-tazobactam (ZOSYN) IVPB 3.375 g     3.375 g 100 mL/hr over 30 Minutes Intravenous  Once 07/17/13 2029 07/17/13 2228       Assessment/Plan  1. Contained perforation of DU 2. EOTH abuse 3. Tobacco abuse Patient Active Problem List   Diagnosis Date Noted  . Alcohol abuse 07/18/2013  . Suspected Duodenal ulcer perforation 07/17/2013  . Memory loss 01/30/2013  . Unspecified hereditary and idiopathic peripheral neuropathy 09/11/2012  . Encounter for therapeutic drug monitoring 09/11/2012  . Spinal stenosis in cervical region 09/11/2012  . Nonspecific abnormal toxicological findings 09/11/2012  . Unspecified urinary incontinence 09/11/2012  . Nonspecific elevation of levels of transaminase or lactic acid dehydrogenase (LDH) 09/11/2012  . Abnormality of gait 09/11/2012  . Thoracic or lumbosacral neuritis or  radiculitis, unspecified 09/11/2012  . Generalized convulsive epilepsy without mention of intractable epilepsy 09/11/2012  . C O P D 08/03/2009  . COUGH 08/03/2009  . 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 HYPERLIPIDEMIA 07/31/2009  . HYPERTENSION 07/31/2009  . SEIZURE DISORDER, HX OF 07/31/2009  . COLONIC POLYPS, ADENOMATOUS, HX OF 07/31/2009  . FATTY LIVER DISEASE, HX OF 07/31/2009  . HEMATURIA, HX OF 07/31/2009   Plan: 1. WBC improving.  Cont NPO to allow this perforation to heal.  Suspect he will need an UGI at some point this weekend prior to feeding him. 2. Would not recommend a CT scan at this point as his last CT scan was just 2 days ago and he seems to be improving.  If he worsens, then he could get a CT scan instead of UGI to rule out leak and formation of abscess. 3. Cont abx therapy.   LOS: 2 days     Alaine Loughney E 07/19/2013, 11:20 AM Pager: [phone]

## 2013-07-20 LAB — CBC
HCT: 37.7 % — ABNORMAL LOW (ref 39.0–52.0)
Hemoglobin: 12.8 g/dL — ABNORMAL LOW (ref 13.0–17.0)
MCH: 36.5 pg — ABNORMAL HIGH (ref 26.0–34.0)
MCHC: 34 g/dL (ref 30.0–36.0)
MCV: 107.4 fL — ABNORMAL HIGH (ref 78.0–100.0)
Platelets: 234 10*3/uL (ref 150–400)
RBC: 3.51 MIL/uL — ABNORMAL LOW (ref 4.22–5.81)
RDW: 13.7 % (ref 11.5–15.5)
WBC: 9.7 10*3/uL (ref 4.0–10.5)

## 2013-07-20 LAB — BASIC METABOLIC PANEL
BUN: <3 mg/dL — ABNORMAL LOW (ref 6–23)
CO2: 25 mEq/L (ref 19–32)
Calcium: 7.9 mg/dL — ABNORMAL LOW (ref 8.4–10.5)
Chloride: 102 mEq/L (ref 96–112)
Creatinine, Ser: 0.69 mg/dL (ref 0.50–1.35)
GFR calc Af Amer: >90 mL/min (ref >90)
GFR calc non Af Amer: >90 mL/min (ref >90)
Glucose, Bld: 85 mg/dL (ref 70–99)
Potassium: 2.9 mEq/L — CL (ref 3.7–5.3)
Sodium: 138 mEq/L (ref 137–147)

## 2013-07-20 LAB — GLUCOSE, CAPILLARY
Glucose-Capillary: 71 mg/dL (ref 70–99)
Glucose-Capillary: 73 mg/dL (ref 70–99)
Glucose-Capillary: 88 mg/dL (ref 70–99)
Glucose-Capillary: 96 mg/dL (ref 70–99)

## 2013-07-20 MED ORDER — POTASSIUM CHLORIDE 10 MEQ/100ML IV SOLN
10.0000 meq | INTRAVENOUS | Status: AC
  Administered 2013-07-20 (×5): 10 meq via INTRAVENOUS

## 2013-07-20 MED ORDER — POTASSIUM CHLORIDE 10 MEQ/100ML IV SOLN
INTRAVENOUS | Status: AC
  Administered 2013-07-20: 10 meq
  Filled 2013-07-20: qty 600

## 2013-07-20 NOTE — Progress Notes (Signed)
Patient ID: Jeffrey Castillo, male   DOB: [birth year], 63 y.o.   MRN: [id number] [facility] Surgery Progress Note:   * No surgery found *  Subjective: Mental status is clear.  Complaining of pain in IV where concentrated KCL is being infused.   Objective: Vital signs in last 24 hours: Temp:  [97.8 F (36.6 C)-98.3 F (36.8 C)] 98 F (36.7 C) (01/10 0400) Pulse Rate:  [60-98] 71 (01/10 0500) Resp:  [13-31] 22 (01/10 0500) BP: (136-182)/(67-99) 169/79 mmHg (01/10 0500) SpO2:  [90 %-99 %] 97 % (01/10 0500) Weight:  [153 lb 14.1 oz (69.8 kg)] 153 lb 14.1 oz (69.8 kg) (01/10 0400)  Intake/Output from previous day: 01/09 0701 - 01/10 0700 In: 3590 [I.V.:3125; IV Piggyback:465] Out: 4100 [Urine:4100] Intake/Output this shift:    Physical Exam: Work of breathing is not labored.  Minimal abdominal pain.  Hungry  Lab Results:  Results for orders placed during the hospital encounter of 07/17/13 (from the past 48 hour(s))  GLUCOSE, CAPILLARY     Status: None   Collection Time    07/18/13 12:12 PM      Result Value Range   Glucose-Capillary 73  70 - 99 mg/dL  GLUCOSE, CAPILLARY     Status: Abnormal   Collection Time    07/18/13  6:03 PM      Result Value Range   Glucose-Capillary 69 (*) 70 - 99 mg/dL  GLUCOSE, CAPILLARY     Status: Abnormal   Collection Time    07/18/13  6:53 PM      Result Value Range   Glucose-Capillary 101 (*) 70 - 99 mg/dL  GLUCOSE, CAPILLARY     Status: Abnormal   Collection Time    07/18/13 11:21 PM      Result Value Range   Glucose-Capillary 62 (*) 70 - 99 mg/dL   Comment 1 Notify RN    GLUCOSE, CAPILLARY     Status: Abnormal   Collection Time    07/18/13 11:43 PM      Result Value Range   Glucose-Capillary 106 (*) 70 - 99 mg/dL   Comment 1 Notify RN    COMPREHENSIVE METABOLIC PANEL     Status: Abnormal   Collection Time    07/19/13  3:54 AM      Result Value Range   Sodium 138  137 - 147 mEq/L   Potassium 3.5 (*) 3.7 - 5.3 mEq/L   Comment:  DELTA CHECK NOTED   Chloride 103  96 - 112 mEq/L   CO2 23  19 - 32 mEq/L   Glucose, Bld 85  70 - 99 mg/dL   BUN 7  6 - 23 mg/dL   Creatinine, Ser 0.69  0.50 - 1.35 mg/dL   Calcium 7.7 (*) 8.4 - 10.5 mg/dL   Total Protein 4.5 (*) 6.0 - 8.3 g/dL   Albumin 1.9 (*) 3.5 - 5.2 g/dL   AST 12  0 - 37 U/L   ALT <5  0 - 53 U/L   Alkaline Phosphatase 58  39 - 117 U/L   Total Bilirubin 0.6  0.3 - 1.2 mg/dL   GFR calc non Af Amer >90  >90 mL/min   GFR calc Af Amer >90  >90 mL/min   Comment: (NOTE)     The eGFR has been calculated using the CKD EPI equation.     This calculation has not been validated in all clinical situations.     eGFR's persistently <90 mL/min signify possible Chronic  Kidney     Disease.  CBC WITH DIFFERENTIAL     Status: Abnormal   Collection Time    07/19/13  3:54 AM      Result Value Range   WBC 11.3 (*) 4.0 - 10.5 K/uL   RBC 3.12 (*) 4.22 - 5.81 MIL/uL   Hemoglobin 11.6 (*) 13.0 - 17.0 g/dL   HCT 33.6 (*) 39.0 - 52.0 %   MCV 107.7 (*) 78.0 - 100.0 fL   MCH 37.2 (*) 26.0 - 34.0 pg   MCHC 34.5  30.0 - 36.0 g/dL   RDW 13.8  11.5 - 15.5 %   Platelets 204  150 - 400 K/uL   Neutrophils Relative % 74  43 - 77 %   Neutro Abs 8.3 (*) 1.7 - 7.7 K/uL   Lymphocytes Relative 17  12 - 46 %   Lymphs Abs 1.9  0.7 - 4.0 K/uL   Monocytes Relative 8  3 - 12 %   Monocytes Absolute 0.9  0.1 - 1.0 K/uL   Eosinophils Relative 1  0 - 5 %   Eosinophils Absolute 0.2  0.0 - 0.7 K/uL   Basophils Relative 0  0 - 1 %   Basophils Absolute 0.0  0.0 - 0.1 K/uL  GLUCOSE, CAPILLARY     Status: None   Collection Time    07/19/13  6:13 AM      Result Value Range   Glucose-Capillary 81  70 - 99 mg/dL  GLUCOSE, CAPILLARY     Status: None   Collection Time    07/19/13 11:53 AM      Result Value Range   Glucose-Capillary 82  70 - 99 mg/dL   Comment 1 Documented in Chart     Comment 2 Notify RN    GLUCOSE, CAPILLARY     Status: None   Collection Time    07/19/13 11:47 PM      Result Value  Range   Glucose-Capillary 96  70 - 99 mg/dL  CBC     Status: Abnormal   Collection Time    07/20/13  3:32 AM      Result Value Range   WBC 9.7  4.0 - 10.5 K/uL   RBC 3.51 (*) 4.22 - 5.81 MIL/uL   Hemoglobin 12.8 (*) 13.0 - 17.0 g/dL   HCT 37.7 (*) 39.0 - 52.0 %   MCV 107.4 (*) 78.0 - 100.0 fL   MCH 36.5 (*) 26.0 - 34.0 pg   MCHC 34.0  30.0 - 36.0 g/dL   RDW 13.7  11.5 - 15.5 %   Platelets 234  150 - 400 K/uL  BASIC METABOLIC PANEL     Status: Abnormal   Collection Time    07/20/13  3:32 AM      Result Value Range   Sodium 138  137 - 147 mEq/L   Potassium 2.9 (*) 3.7 - 5.3 mEq/L   Comment: CRITICAL RESULT CALLED TO, READ BACK BY AND VERIFIED WITH:     COBB,D RN @0442  ON 01.10.2015 BY MCREYNOLDS,B   Chloride 102  96 - 112 mEq/L   CO2 25  19 - 32 mEq/L   Glucose, Bld 85  70 - 99 mg/dL   BUN <3 (*) 6 - 23 mg/dL   Creatinine, Ser 0.69  0.50 - 1.35 mg/dL   Calcium 7.9 (*) 8.4 - 10.5 mg/dL   GFR calc non Af Amer >90  >90 mL/min   GFR calc Af Amer >90  >90 mL/min  Comment: (NOTE)     The eGFR has been calculated using the CKD EPI equation.     This calculation has not been validated in all clinical situations.     eGFR's persistently <90 mL/min signify possible Chronic Kidney     Disease.  GLUCOSE, CAPILLARY     Status: None   Collection Time    07/20/13  7:34 AM      Result Value Range   Glucose-Capillary 88  70 - 99 mg/dL   Comment 1 Documented in Chart     Comment 2 Notify RN      Radiology/Results: No results found.  Anti-infectives: Anti-infectives   Start     Dose/Rate Route Frequency Ordered Stop   07/18/13 0600  piperacillin-tazobactam (ZOSYN) IVPB 3.375 g     3.375 g 12.5 mL/hr over 240 Minutes Intravenous Every 8 hours 07/17/13 2300     07/17/13 2100  piperacillin-tazobactam (ZOSYN) IVPB 3.375 g     3.375 g 100 mL/hr over 30 Minutes Intravenous  Once 07/17/13 2029 07/17/13 2228      Assessment/Plan: Problem List: Patient Active Problem List    Diagnosis Date Noted  . Alcohol abuse 07/18/2013  . Suspected Duodenal ulcer perforation 07/17/2013  . Memory loss 01/30/2013  . Unspecified hereditary and idiopathic peripheral neuropathy 09/11/2012  . Encounter for therapeutic drug monitoring 09/11/2012  . Spinal stenosis in cervical region 09/11/2012  . Nonspecific abnormal toxicological findings 09/11/2012  . Unspecified urinary incontinence 09/11/2012  . Nonspecific elevation of levels of transaminase or lactic acid dehydrogenase (LDH) 09/11/2012  . Abnormality of gait 09/11/2012  . Thoracic or lumbosacral neuritis or radiculitis, unspecified 09/11/2012  . Generalized convulsive epilepsy without mention of intractable epilepsy 09/11/2012  . C O P D 08/03/2009  . COUGH 08/03/2009  . 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 HYPERLIPIDEMIA 07/31/2009  . HYPERTENSION 07/31/2009  . SEIZURE DISORDER, HX OF 07/31/2009  . COLONIC POLYPS, ADENOMATOUS, HX OF 07/31/2009  . FATTY LIVER DISEASE, HX OF 07/31/2009  . HEMATURIA, HX OF 07/31/2009    Improving.  Would go slowly with enteral intake.  * No surgery found *    LOS: 3 days   Matt B. Hassell Done, MD, [facility] [phone] beeper [phone]  07/20/2013 8:46 AM

## 2013-07-20 NOTE — Progress Notes (Signed)
ANTIBIOTIC CONSULT NOTE - FOLLOW UP  Pharmacy Consult for Zosyn Indication: suspected perforated duodenum  Allergies  Allergen Reactions  . Lyrica [Pregabalin]     Hallucinations.    . Sulfonamide Derivatives     REACTION: unknown as a child  . Valsartan     REACTION: lightheaded    Patient Measurements: Height: 5\' 7"  (170.2 cm) Weight: 153 lb 14.1 oz (69.8 kg) IBW/kg (Calculated) : 66.1   Vital Signs: Temp: 97.5 F (36.4 C) (01/10 0800) Temp src: Oral (01/10 0800) BP: 169/79 mmHg (01/10 0500) Pulse Rate: 71 (01/10 0500) Intake/Output from previous day: 01/09 0701 - 01/10 0700 In: 3590 [I.V.:3125; IV Piggyback:465] Out: 4100 [Urine:4100] Intake/Output from this shift:    Labs:  Recent Labs  07/18/13 0340 07/19/13 0354 07/20/13 0332  WBC 13.5* 11.3* 9.7  HGB 12.4* 11.6* 12.8*  PLT 208 204 234  CREATININE 0.73 0.69 0.69   Estimated Creatinine Clearance: 87.2 ml/min (by C-G formula based on Cr of 0.69). No results found for this basename: VANCOTROUGH, Corlis Leak, VANCORANDOM, GENTTROUGH, GENTPEAK, GENTRANDOM, TOBRATROUGH, TOBRAPEAK, TOBRARND, AMIKACINPEAK, AMIKACINTROU, AMIKACIN,  in the last 72 hours   Microbiology: Recent Results (from the past 720 hour(s))  MRSA PCR SCREENING     Status: None   Collection Time    07/18/13  2:22 AM      Result Value Range Status   MRSA by PCR NEGATIVE  NEGATIVE Final   Comment:            The GeneXpert MRSA Assay (FDA     approved for NASAL specimens     only), is one component of a     comprehensive MRSA colonization     surveillance program. It is not     intended to diagnose MRSA     infection nor to guide or     monitor treatment for     MRSA infections.    Anti-infectives   Start     Dose/Rate Route Frequency Ordered Stop   07/18/13 0600  piperacillin-tazobactam (ZOSYN) IVPB 3.375 g     3.375 g 12.5 mL/hr over 240 Minutes Intravenous Every 8 hours 07/17/13 2300     07/17/13 2100  piperacillin-tazobactam  (ZOSYN) IVPB 3.375 g     3.375 g 100 mL/hr over 30 Minutes Intravenous  Once 07/17/13 2029 07/17/13 2228      Assessment: 76 yoM presented on 1/7 with complaints of constipation, vomiting and abd pain x 4 months associated with lower right back pain. CT abd/pelvis cannot exclude perforated duodenum.  Day #4 Zosyn 3.375g IV q8h (4 hour infusion time).  Per Surgery, patient improving and slowly advancing enteral intake.  Continuing Zosyn for now.  Tmax: AF WBC: WNL now Renal: stable Scr 0.69, CrCl ~62 ml/min (CG)  Goal of Therapy:  Eradication of infection  Plan:  Continue Zosyn 3.375g IV q8h (4 hour infusion time).  Renal function stable, no dose adjustments necessary. Pharmacy will sign off from notes and follow peripherally.  Hershal Coria 07/20/2013,9:50 AM

## 2013-07-20 NOTE — Progress Notes (Signed)
[facility] handout for pt teaching regarding Cirrhosis and Pancreatitis. Anatomical and symptomatic pictures for reference also provided.

## 2013-07-20 NOTE — Progress Notes (Signed)
CRITICAL VALUE ALERT  Critical value received:  K=2.9  Date of notification:  07/20/13  Time of notification:  05:29  Critical value read back:yes  Nurse who received alert:  Bethann Humble, RN  MD notified (1st page):  Walden Field, [place]  Time of first page:  05:30  MD notified (2nd page):  Time of second page:  Responding MD:  Walden Field, [facility]  Time MD responded:  05:30

## 2013-07-20 NOTE — Progress Notes (Signed)
[facility] PROGRESS NOTE  Jeffrey Castillo FAO:[id number] DOB: Jun 08, 1951 DOA: 07/17/2013 PCP: Rozanna Box, MD  Assessment/Plan: Duodenal ulcer with concern of duodenal perforation  -Appreciate general surgery consultation and followup  -Continue PPI  -Continue Zosyn  -Continue IV fluids  -07/17/2013 CT abdomen showed mesenteric inflammatory changes in the duodenum with wall thickening and possible wall defect  Mesenteric fluid collection/soft tissue mass  -Discussed with Dr. Daphine Deutscher today--repeat CT to reassess "phlegmon"/fluid collections when stable, prior to d/c -Continue antibiotics for now  -Concerned about abscess versus malignancy  -UGI study with water soluble contrast -d/c foley cath Intra-abdominal fluid collections  -?? Phlegmontous collections from pancreatitis  -as above, discussed with Dr. Daphine Deutscher  Right retroperitoneal discord fluid collection  -May need IR fluid drainage  -Continue antibiotics for now until Jeffrey Castillo is stable  Alcohol abuse  -Alcohol withdrawal protocol  Hypertension  -clonidine patch--restarted 07/19/2013 -Will take 48-72 hours for maximum effect -Monitor blood pressure closely  Tobacco abuse  -Nicoderm patch  COPD  -Stable presently  -Aerosolized albuterol when necessary  Seizure disorder  -Keppra increased from 500 to 750mg  twice a day  -Restart Lamictal 1 the Jeffrey Castillo is able to tolerate po  Family Communication: Pt at beside  Disposition Plan: Home when medically stable  Antibiotics:  Zosyn 07/17/2013>>>          Procedures/Studies: Ct Abdomen Pelvis W Contrast  07/18/2013   ADDENDUM REPORT: 07/18/2013 08:47  ADDENDUM: Lab work today demonstrates a lipase of 103. The above findings may simply represent advanced acute pancreatitis with multiple phlegmon collections. The cystic mass in the head of the pancreas should be followed with the repeat CT in 3 months.   Electronically Signed   By: Maryclare Bean M.D.   On: 07/18/2013  08:47   07/18/2013   CLINICAL DATA:  Lower abdominal pain  EXAM: CT ABDOMEN AND PELVIS WITH CONTRAST  TECHNIQUE: Multidetector CT imaging of the abdomen and pelvis was performed using the standard protocol following bolus administration of intravenous contrast.  CONTRAST:  OMNIPAQUE IOHEXOL 300 MG/ML  SOLN  COMPARISON:  05/29/2007  FINDINGS: Spinal cord stimulator is in place extending from the upper lumbar spine at canal into the thoracic posterior spinal canal.  Nonspecific hypodensities are present within the liver. The liver is somewhat cirrhotic. The largest is towards the dome measuring 1.0 cm. They are most likely cysts.  Small gallstone is present. No obvious inflammatory changes of the gallbladder.  There is a significant inflammatory process occurring within the mesenteric. The inflammatory process relatively spares the pancreas. There is some stranding towards the tail of the pancreas. The inflammatory process is most intimately associated with the 3rd and 4th portions of the duodenum. This is associated with wall thickening. There is also a possible defect within the wall of the duodenum. See image 54 of series 4 on the coronal reconstructions.  There is a complex cystic mass within the uncinate process of the pancreas measuring 2.8 x 2.7 cm. Fluid density is seen extending from the duodenum and uncinate process into the mesenteric.  There is a complex fluid and soft tissue mass within the mesentery measuring 3.6 x 6.8 cm on image 44. This may represent phlegmon of or necrotic adenopathy. Similar densities with some fat density are seen in the posterior right perinephric space. There is a discoid fluid collection superior to the right kidney in the retroperitoneum measuring 2.8 x 5.5 cm.  Of note, there is no definitive free intraperitoneal gas.  Adrenal  glands are within normal limits. Simple cysts in both kidneys.  Sigmoid diverticulosis without definite acute diverticulitis. Bladder is within  normal limits.  Degenerative and postoperative changes in the lumbar spine.  IMPRESSION: There is a prominent inflammatory process within the mesenteries of the abdomen as described above. The most critical finding is inflammatory changes intimately associated with the distal duodenum an a possible defect within the duodenum. There is no definite extraperitoneal or intraperitoneal gas however perforation cannot be excluded. There is relative sparing of the inflammatory process to the pancreas.  Other intra-abdominal fluid collections as described.  Complex mass within the mesentery which may represent adenopathy or an inflammatory phlegmon. Similar densities posterior to the right kidney.  Cholelithiasis.  Nonspecific hypodensities in the liver. Because the liver is somewhat cirrhotic. The Jeffrey Castillo is at risk for liver malignancy. Follow-up outpatient MRI is recommended.  Electronically Signed: By: Maryclare Bean M.D. On: 07/17/2013 20:22   US Venous Img Lower Unilateral Right  07/01/2013   CLINICAL DATA:  Left leg pain  EXAM: Left LOWER EXTREMITY VENOUS DOPPLER ULTRASOUND  TECHNIQUE: Gray-scale sonography with graded compression, as well as color Doppler and duplex ultrasound, were performed to evaluate the deep venous system from the level of the common femoral vein through the popliteal and proximal calf veins. Spectral Doppler was utilized to evaluate flow at rest and with distal augmentation maneuvers.  COMPARISON:  None.  FINDINGS: Thrombus within deep veins:  None visualized.  Compressibility of deep veins:  Normal.  Duplex waveform respiratory phasicity:  Normal.  Duplex waveform response to augmentation:  Normal.  Venous reflux:  None visualized.  Other findings:  None visualized.  IMPRESSION: No acute abnormality is noted.  No deep venous thrombosis is seen.   Electronically Signed   By: Alcide Clever M.D.   On: 07/01/2013 16:24   Dg Abd Acute W/chest  07/17/2013   CLINICAL DATA:  Constipation, nausea,  vomiting, rectal bleeding, history hypertension  EXAM: ACUTE ABDOMEN SERIES (ABDOMEN 2 VIEW & CHEST 1 VIEW)  COMPARISON:  Chest radiographs 08/13/2010  FINDINGS: Rotated on chest radiograph to the left.  Normal heart size, mediastinal contours, and pulmonary vascularity.  Lungs appear emphysematous but clear.  No pleural effusion or pneumothorax.  Prior cervical spine fusion.  Intraspinal stimulator leads noted.  Bones diffusely demineralized.  Normal bowel gas pattern.  No bowel dilatation or bowel wall thickening or free intraperitoneal air.  Scattered atherosclerotic calcifications.  No definite urinary tract calcification.  IMPRESSION: Question COPD.  No acute abdominal findings.   Electronically Signed   By: Ulyses Southward M.D.   On: 07/17/2013 20:00         Subjective: Jeffrey Castillo is feeling hungry. He wants to eat. Denies fevers, chills, chest pain, shortness breath, nausea, vomiting, diarrhea, abdominal pain. He is passing flatus.  Objective: Filed Vitals:   07/20/13 0400 07/20/13 0500 07/20/13 0800 07/20/13 0900  BP: 177/85 169/79  164/93  Pulse: 65 71  66  Temp: 98 F (36.7 C)  97.5 F (36.4 C)   TempSrc: Oral  Oral   Resp: 18 22  22   Height:      Weight: 69.8 kg (153 lb 14.1 oz)     SpO2: 95% 97%  96%    Intake/Output Summary (Last 24 hours) at 07/20/13 1239 Last data filed at 07/20/13 0547  Gross per 24 hour  Intake 2454.17 ml  Output   4100 ml  Net -1645.83 ml   Weight change: 22.4 kg (49 lb 6.1  oz) Exam:   General:  Pt is alert, follows commands appropriately, not in acute distress  HEENT: No icterus, No thrush,  [place]/AT  Cardiovascular: RRR, S1/S2, no rubs, no gallops  Respiratory: CTA bilaterally, no wheezing, no crackles, no rhonchi  Abdomen: Soft/+BS, mild epigastric tenderness without rebound, non distended, no guarding  Extremities: No edema, No lymphangitis, No petechiae, No rashes, no synovitis  Data Reviewed: Basic Metabolic Panel:  Recent Labs Lab  07/17/13 1939 07/18/13 0340 07/19/13 0354 07/20/13 0332  NA 136* 136* 138 138  K 4.1 4.4 3.5* 2.9*  CL 95* 102 103 102  CO2  --  26 23 25   GLUCOSE 104* 83 85 85  BUN 19 12 7  <3*  CREATININE 0.90 0.73 0.69 0.69  CALCIUM  --  7.7* 7.7* 7.9*   Liver Function Tests:  Recent Labs Lab 07/17/13 1920 07/18/13 0340 07/19/13 0354  AST 13 11 12   ALT 6 <5 <5  ALKPHOS 81 59 58  BILITOT 0.9 0.7 0.6  PROT 6.3 4.4* 4.5*  ALBUMIN 2.9* 2.0* 1.9*    Recent Labs Lab 07/17/13 1920  LIPASE 103*   No results found for this basename: AMMONIA,  in the last 168 hours CBC:  Recent Labs Lab 07/17/13 1920 07/17/13 1939 07/18/13 0340 07/19/13 0354 07/20/13 0332  WBC 22.0*  --  13.5* 11.3* 9.7  NEUTROABS 18.3*  --   --  8.3*  --   HGB 15.7 17.7* 12.4* 11.6* 12.8*  HCT 44.5 52.0 36.7* 33.6* 37.7*  MCV 108.3*  --  109.6* 107.7* 107.4*  PLT 271  --  208 204 234   Cardiac Enzymes: No results found for this basename: CKTOTAL, CKMB, CKMBINDEX, TROPONINI,  in the last 168 hours BNP: No components found with this basename: POCBNP,  CBG:  Recent Labs Lab 07/18/13 2343 07/19/13 0613 07/19/13 1153 07/19/13 2347 07/20/13 0734  GLUCAP 106* 81 82 96 88    Recent Results (from the past 240 hour(s))  MRSA PCR SCREENING     Status: None   Collection Time    07/18/13  2:22 AM      Result Value Range Status   MRSA by PCR NEGATIVE  NEGATIVE Final   Comment:            The GeneXpert MRSA Assay (FDA     approved for NASAL specimens     only), is one component of a     comprehensive MRSA colonization     surveillance program. It is not     intended to diagnose MRSA     infection nor to guide or     monitor treatment for     MRSA infections.     Scheduled Meds: . cloNIDine  0.2 mg Transdermal Weekly  . heparin  5,000 Units Subcutaneous Q8H  . levETIRAcetam  750 mg Intravenous Q12H  . LORazepam  0-4 mg Intravenous Q12H  . nicotine  14 mg Transdermal Daily  . [START ON 07/21/2013]  pantoprazole (PROTONIX) IV  40 mg Intravenous Q12H  . piperacillin-tazobactam (ZOSYN)  IV  3.375 g Intravenous Q8H  . sodium chloride  3 mL Intravenous Q12H   Continuous Infusions: . dextrose 5 % and 0.9% NaCl 100 mL/hr at 07/19/13 1238  . pantoprozole (PROTONIX) infusion 8 mg/hr (07/20/13 0539)     Tamarra Geiselman, DO  [facility] Pager [phone]  If 7PM-7AM, please contact night-coverage [email or web address] Password [email or web address] 07/20/2013, 12:39 PM   LOS: 3 days

## 2013-07-21 ENCOUNTER — Inpatient Hospital Stay (HOSPITAL_COMMUNITY): Payer: Medicare Other

## 2013-07-21 LAB — CBC
HCT: 37.3 % — ABNORMAL LOW (ref 39.0–52.0)
Hemoglobin: 13.2 g/dL (ref 13.0–17.0)
MCH: 36.7 pg — ABNORMAL HIGH (ref 26.0–34.0)
MCHC: 35.4 g/dL (ref 30.0–36.0)
MCV: 103.6 fL — ABNORMAL HIGH (ref 78.0–100.0)
Platelets: 265 10*3/uL (ref 150–400)
RBC: 3.6 MIL/uL — ABNORMAL LOW (ref 4.22–5.81)
RDW: 13.3 % (ref 11.5–15.5)
WBC: 11.3 10*3/uL — ABNORMAL HIGH (ref 4.0–10.5)

## 2013-07-21 LAB — BASIC METABOLIC PANEL
BUN: <3 mg/dL — ABNORMAL LOW (ref 6–23)
CO2: 24 mEq/L (ref 19–32)
Calcium: 7.8 mg/dL — ABNORMAL LOW (ref 8.4–10.5)
Chloride: 101 mEq/L (ref 96–112)
Creatinine, Ser: 0.63 mg/dL (ref 0.50–1.35)
GFR calc Af Amer: >90 mL/min (ref >90)
GFR calc non Af Amer: >90 mL/min (ref >90)
Glucose, Bld: 104 mg/dL — ABNORMAL HIGH (ref 70–99)
Potassium: 3.2 mEq/L — ABNORMAL LOW (ref 3.7–5.3)
Sodium: 137 mEq/L (ref 137–147)

## 2013-07-21 LAB — GLUCOSE, CAPILLARY
Glucose-Capillary: 101 mg/dL — ABNORMAL HIGH (ref 70–99)
Glucose-Capillary: 106 mg/dL — ABNORMAL HIGH (ref 70–99)
Glucose-Capillary: 85 mg/dL (ref 70–99)
Glucose-Capillary: 93 mg/dL (ref 70–99)
Glucose-Capillary: 96 mg/dL (ref 70–99)

## 2013-07-21 MED ORDER — IOHEXOL 300 MG/ML  SOLN
150.0000 mL | Freq: Once | INTRAMUSCULAR | Status: AC | PRN
  Administered 2013-07-21: 150 mL via ORAL

## 2013-07-21 MED ORDER — IOHEXOL 300 MG/ML  SOLN
50.0000 mL | Freq: Once | INTRAMUSCULAR | Status: AC | PRN
  Administered 2013-07-21: 50 mL via INTRAVENOUS

## 2013-07-21 MED ORDER — POTASSIUM CHLORIDE 2 MEQ/ML IV SOLN: INTRAVENOUS | Status: DC

## 2013-07-21 MED ORDER — POTASSIUM CHLORIDE 10 MEQ/100ML IV SOLN
10.0000 meq | INTRAVENOUS | Status: AC
  Administered 2013-07-21 (×4): 10 meq via INTRAVENOUS
  Filled 2013-07-21 (×4): qty 100

## 2013-07-21 MED ORDER — KCL IN DEXTROSE-NACL 20-5-0.9 MEQ/L-%-% IV SOLN
INTRAVENOUS | Status: DC
  Administered 2013-07-21 – 2013-07-23 (×5): via INTRAVENOUS
  Filled 2013-07-21 (×7): qty 1000

## 2013-07-21 NOTE — Progress Notes (Signed)
Report called to [facility], RN for transfer to Rm 1430.

## 2013-07-21 NOTE — Progress Notes (Signed)
No change from AM assesment.  Pt resting in bed no c/o

## 2013-07-21 NOTE — Progress Notes (Signed)
Attempted to see Pt.  Pt off the floor having a procedure.  Bernita Raisin, [place] Work [phone]

## 2013-07-21 NOTE — Progress Notes (Signed)
[facility] PROGRESS NOTE  Jeffrey Castillo ZOX:[id number] DOB: [birth year] DOA: 07/17/2013 PCP: Rozanna Box, MD  Assessment/Plan: Duodenal ulcer with concern of duodenal perforation  -Appreciate general surgery consultation and followup  -Continue PPI  -Continue Zosyn  -Continue IV fluids  -07/17/2013 CT abdomen showed mesenteric inflammatory changes in the duodenum with wall thickening and possible wall defect  Mesenteric fluid collection/soft tissue mass  -Discussed with Dr. Daphine Deutscher --repeat CT to reassess "phlegmon"/fluid collections when stable, prior to d/c  -Continue antibiotics for now  -Concerned about abscess versus malignancy  -UGI study with water soluble contrast-07/21/13 -d/c foley cath  Intra-abdominal fluid collections  -?? Phlegmontous collections from pancreatitis  -as above, discussed with Dr. Daphine Deutscher  Right retroperitoneal discord fluid collection  -May need IR fluid drainage  -Continue antibiotics for now until patient is stable  Alcohol abuse  -Alcohol withdrawal protocol--no signs of DT -spent speaking with wife and patient about adverse effects of chronic alcohol use Hypertension  -clonidine patch--restarted 07/19/2013  -Will take 48-72 hours for maximum effect  -Monitor blood pressure closely  Tobacco abuse  -Nicoderm patch  COPD  -Stable presently  -Aerosolized albuterol when necessary  Seizure disorder  -Keppra increased from 500 to 750mg  twice a day  -Restart Lamictal 1 the patient is able to tolerate po  Hypokalemia -Replete -Check magnesium Family Communication: wife at beside  Disposition Plan: Home when medically stable  Antibiotics:  Zosyn 07/17/2013>>>       Procedures/Studies: Ct Abdomen Pelvis W Contrast  07/18/2013   ADDENDUM REPORT: 07/18/2013 08:47  ADDENDUM: Lab work today demonstrates a lipase of 103. The above findings may simply represent advanced acute pancreatitis with multiple phlegmon collections. The cystic  mass in the head of the pancreas should be followed with the repeat CT in 3 months.   Electronically Signed   By: Maryclare Bean M.D.   On: 07/18/2013 08:47   07/18/2013   CLINICAL DATA:  Lower abdominal pain  EXAM: CT ABDOMEN AND PELVIS WITH CONTRAST  TECHNIQUE: Multidetector CT imaging of the abdomen and pelvis was performed using the standard protocol following bolus administration of intravenous contrast.  CONTRAST:  OMNIPAQUE IOHEXOL 300 MG/ML  SOLN  COMPARISON:  05/29/2007  FINDINGS: Spinal cord stimulator is in place extending from the upper lumbar spine at canal into the thoracic posterior spinal canal.  Nonspecific hypodensities are present within the liver. The liver is somewhat cirrhotic. The largest is towards the dome measuring 1.0 cm. They are most likely cysts.  Small gallstone is present. No obvious inflammatory changes of the gallbladder.  There is a significant inflammatory process occurring within the mesenteric. The inflammatory process relatively spares the pancreas. There is some stranding towards the tail of the pancreas. The inflammatory process is most intimately associated with the 3rd and 4th portions of the duodenum. This is associated with wall thickening. There is also a possible defect within the wall of the duodenum. See image 54 of series 4 on the coronal reconstructions.  There is a complex cystic mass within the uncinate process of the pancreas measuring 2.8 x 2.7 cm. Fluid density is seen extending from the duodenum and uncinate process into the mesenteric.  There is a complex fluid and soft tissue mass within the mesentery measuring 3.6 x 6.8 cm on image 44. This may represent phlegmon of or necrotic adenopathy. Similar densities with some fat density are seen in the posterior right perinephric space. There is a discoid fluid collection superior to the right  kidney in the retroperitoneum measuring 2.8 x 5.5 cm.  Of note, there is no definitive free intraperitoneal gas.  Adrenal  glands are within normal limits. Simple cysts in both kidneys.  Sigmoid diverticulosis without definite acute diverticulitis. Bladder is within normal limits.  Degenerative and postoperative changes in the lumbar spine.  IMPRESSION: There is a prominent inflammatory process within the mesenteries of the abdomen as described above. The most critical finding is inflammatory changes intimately associated with the distal duodenum an a possible defect within the duodenum. There is no definite extraperitoneal or intraperitoneal gas however perforation cannot be excluded. There is relative sparing of the inflammatory process to the pancreas.  Other intra-abdominal fluid collections as described.  Complex mass within the mesentery which may represent adenopathy or an inflammatory phlegmon. Similar densities posterior to the right kidney.  Cholelithiasis.  Nonspecific hypodensities in the liver. Because the liver is somewhat cirrhotic. The patient is at risk for liver malignancy. Follow-up outpatient MRI is recommended.  Electronically Signed: By: Maryclare Bean M.D. On: 07/17/2013 20:22   US Venous Img Lower Unilateral Right  07/01/2013   CLINICAL DATA:  Left leg pain  EXAM: Left LOWER EXTREMITY VENOUS DOPPLER ULTRASOUND  TECHNIQUE: Gray-scale sonography with graded compression, as well as color Doppler and duplex ultrasound, were performed to evaluate the deep venous system from the level of the common femoral vein through the popliteal and proximal calf veins. Spectral Doppler was utilized to evaluate flow at rest and with distal augmentation maneuvers.  COMPARISON:  None.  FINDINGS: Thrombus within deep veins:  None visualized.  Compressibility of deep veins:  Normal.  Duplex waveform respiratory phasicity:  Normal.  Duplex waveform response to augmentation:  Normal.  Venous reflux:  None visualized.  Other findings:  None visualized.  IMPRESSION: No acute abnormality is noted.  No deep venous thrombosis is seen.    Electronically Signed   By: Alcide Clever M.D.   On: 07/01/2013 16:24   Dg Abd Acute W/chest  07/17/2013   CLINICAL DATA:  Constipation, nausea, vomiting, rectal bleeding, history hypertension  EXAM: ACUTE ABDOMEN SERIES (ABDOMEN 2 VIEW & CHEST 1 VIEW)  COMPARISON:  Chest radiographs 08/13/2010  FINDINGS: Rotated on chest radiograph to the left.  Normal heart size, mediastinal contours, and pulmonary vascularity.  Lungs appear emphysematous but clear.  No pleural effusion or pneumothorax.  Prior cervical spine fusion.  Intraspinal stimulator leads noted.  Bones diffusely demineralized.  Normal bowel gas pattern.  No bowel dilatation or bowel wall thickening or free intraperitoneal air.  Scattered atherosclerotic calcifications.  No definite urinary tract calcification.  IMPRESSION: Question COPD.  No acute abdominal findings.   Electronically Signed   By: Ulyses Southward M.D.   On: 07/17/2013 20:00         Subjective: Patient feels hungry. He wants to eat. Denies fevers, chills, chest discomfort, shortness breath, nausea, vomiting, diarrhea, abdominal pain. He is passing flatus.  Objective: Filed Vitals:   07/21/13 0700 07/21/13 0800 07/21/13 0900 07/21/13 1000  BP: 121/52 125/59 155/71 167/79  Pulse: 72 63 67 65  Temp:  98.5 F (36.9 C)    TempSrc:  Oral    Resp: 25 21 21 19   Height:      Weight:      SpO2: 90% 91% 96% 95%    Intake/Output Summary (Last 24 hours) at 07/21/13 1115 Last data filed at 07/21/13 0600  Gross per 24 hour  Intake 2250.83 ml  Output   2520 ml  Net -269.17 ml   Weight change: -1.8 kg (-3 lb 15.5 oz) Exam:   General:  Pt is alert, follows commands appropriately, not in acute distress  HEENT: No icterus, No thrush,Jeffrey Castillo  Cardiovascular: RRR, S1/S2, no rubs, no gallops  Respiratory: CTA bilaterally, no wheezing, no crackles, no rhonchi  Abdomen: Soft/+BS, non tender, non distended, no guarding  Extremities: No edema, No lymphangitis, No petechiae, No  rashes, no synovitis  Data Reviewed: Basic Metabolic Panel:  Recent Labs Lab 07/17/13 1939 07/18/13 0340 07/19/13 0354 07/20/13 0332 07/21/13 0344  NA 136* 136* 138 138 137  K 4.1 4.4 3.5* 2.9* 3.2*  CL 95* 102 103 102 101  CO2  --  26 23 25 24   GLUCOSE 104* 83 85 85 104*  BUN 19 12 7  <3* <3*  CREATININE 0.90 0.73 0.69 0.69 0.63  CALCIUM  --  7.7* 7.7* 7.9* 7.8*   Liver Function Tests:  Recent Labs Lab 07/17/13 1920 07/18/13 0340 07/19/13 0354  AST 13 11 12   ALT 6 <5 <5  ALKPHOS 81 59 58  BILITOT 0.9 0.7 0.6  PROT 6.3 4.4* 4.5*  ALBUMIN 2.9* 2.0* 1.9*    Recent Labs Lab 07/17/13 1920  LIPASE 103*   No results found for this basename: AMMONIA,  in the last 168 hours CBC:  Recent Labs Lab 07/17/13 1920 07/17/13 1939 07/18/13 0340 07/19/13 0354 07/20/13 0332 07/21/13 0344  WBC 22.0*  --  13.5* 11.3* 9.7 11.3*  NEUTROABS 18.3*  --   --  8.3*  --   --   HGB 15.7 17.7* 12.4* 11.6* 12.8* 13.2  HCT 44.5 52.0 36.7* 33.6* 37.7* 37.3*  MCV 108.3*  --  109.6* 107.7* 107.4* 103.6*  PLT 271  --  208 204 234 265   Cardiac Enzymes: No results found for this basename: CKTOTAL, CKMB, CKMBINDEX, TROPONINI,  in the last 168 hours BNP: No components found with this basename: POCBNP,  CBG:  Recent Labs Lab 07/19/13 2347 07/20/13 0734 07/20/13 1231 07/20/13 1630 07/20/13 2334  GLUCAP 96 88 71 73 85    Recent Results (from the past 240 hour(s))  MRSA PCR SCREENING     Status: None   Collection Time    07/18/13  2:22 AM      Result Value Range Status   MRSA by PCR NEGATIVE  NEGATIVE Final   Comment:            The GeneXpert MRSA Assay (FDA     approved for NASAL specimens     only), is one component of a     comprehensive MRSA colonization     surveillance program. It is not     intended to diagnose MRSA     infection nor to guide or     monitor treatment for     MRSA infections.     Scheduled Meds: . cloNIDine  0.2 mg Transdermal Weekly  .  heparin  5,000 Units Subcutaneous Q8H  . levETIRAcetam  750 mg Intravenous Q12H  . LORazepam  0-4 mg Intravenous Q12H  . nicotine  14 mg Transdermal Daily  . pantoprazole (PROTONIX) IV  40 mg Intravenous Q12H  . piperacillin-tazobactam (ZOSYN)  IV  3.375 g Intravenous Q8H  . sodium chloride  3 mL Intravenous Q12H   Continuous Infusions: . dextrose 5 % and 0.9% NaCl 1,000 mL (07/21/13 0504)     Jeffrey Hardman, DO  [facility] Pager [phone]  If 7PM-7AM, please contact night-coverage [email or web address] Password [facility] 07/21/2013,  11:15 AM   LOS: 4 days

## 2013-07-22 LAB — GLUCOSE, CAPILLARY
Glucose-Capillary: 96 mg/dL (ref 70–99)
Glucose-Capillary: 129 mg/dL — ABNORMAL HIGH (ref 70–99)
Glucose-Capillary: 101 mg/dL — ABNORMAL HIGH (ref 70–99)

## 2013-07-22 LAB — BASIC METABOLIC PANEL
BUN: <3 mg/dL — ABNORMAL LOW (ref 6–23)
CO2: 22 mEq/L (ref 19–32)
Calcium: 7.6 mg/dL — ABNORMAL LOW (ref 8.4–10.5)
Chloride: 103 mEq/L (ref 96–112)
Creatinine, Ser: 0.73 mg/dL (ref 0.50–1.35)
GFR calc Af Amer: >90 mL/min (ref >90)
GFR calc non Af Amer: >90 mL/min (ref >90)
Glucose, Bld: 108 mg/dL — ABNORMAL HIGH (ref 70–99)
Potassium: 3.3 mEq/L — ABNORMAL LOW (ref 3.7–5.3)
Sodium: 136 mEq/L — ABNORMAL LOW (ref 137–147)

## 2013-07-22 LAB — CBC
HCT: 34.5 % — ABNORMAL LOW (ref 39.0–52.0)
Hemoglobin: 11.9 g/dL — ABNORMAL LOW (ref 13.0–17.0)
MCH: 36.2 pg — ABNORMAL HIGH (ref 26.0–34.0)
MCHC: 34.5 g/dL (ref 30.0–36.0)
MCV: 104.9 fL — ABNORMAL HIGH (ref 78.0–100.0)
Platelets: 240 10*3/uL (ref 150–400)
RBC: 3.29 MIL/uL — ABNORMAL LOW (ref 4.22–5.81)
RDW: 13.8 % (ref 11.5–15.5)
WBC: 8.2 10*3/uL (ref 4.0–10.5)

## 2013-07-22 MED ORDER — HYDROCOD POLST-CHLORPHEN POLST 10-8 MG/5ML PO LQCR
5.0000 mL | Freq: Two times a day (BID) | ORAL | Status: DC | PRN
  Administered 2013-07-22 – 2013-07-23 (×2): 5 mL via ORAL
  Filled 2013-07-22 (×2): qty 5

## 2013-07-22 MED ORDER — POTASSIUM CHLORIDE 20 MEQ/15ML (10%) PO LIQD
20.0000 meq | Freq: Once | ORAL | Status: AC
  Administered 2013-07-22: 20 meq via ORAL
  Filled 2013-07-22: qty 15

## 2013-07-22 NOTE — Progress Notes (Signed)
Patient seen and examined.  Daughter in room.  UGI and CT reviewed.  Will start clear liquids today.  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  He will need to go home on bid Protonix and have an EGD done in 6-8 weeks by GI.

## 2013-07-22 NOTE — Progress Notes (Signed)
Subjective: No complaints, no abdominal pain, no withdrawal symptoms. Objective: Vital signs in last 24 hours: Temp:  [97.4 F (36.3 C)-98.8 F (37.1 C)] 98.1 F (36.7 C) (01/12 0606) Pulse Rate:  [56-102] 56 (01/12 0606) Resp:  [18-21] 18 (01/12 0606) BP: (125-182)/(58-87) 125/66 mmHg (01/12 0606) SpO2:  [95 %-100 %] 95 % (01/12 0606) Weight:  [66.6 kg (146 lb 13.2 oz)] 66.6 kg (146 lb 13.2 oz) (01/12 0606) Last BM Date: 07/21/13 Nothing PO recorded, 2 stools yesterday Afebrile, VSS K+ 3.3, WBC 8.2 H/H is down some. UGI shows no extravasation of contrast. Intake/Output from previous day: 01/11 0701 - 01/12 0700 In: 2911.7 [I.V.:2196.7; IV Piggyback:715] Out: 1525 [Urine:1525] Intake/Output this shift: Total I/O In: 107 [IV Piggyback:107] Out: 250 [Urine:250]  General appearance: alert, cooperative and no distress GI: soft, non-tender; bowel sounds normal; no masses,  no organomegaly  Lab Results:   Recent Labs  07/21/13 0344 07/22/13 0520  WBC 11.3* 8.2  HGB 13.2 11.9*  HCT 37.3* 34.5*  PLT 265 240    BMET  Recent Labs  07/21/13 0344 07/22/13 0520  NA 137 136*  K 3.2* 3.3*  CL 101 103  CO2 24 22  GLUCOSE 104* 108*  BUN <3* <3*  CREATININE 0.63 0.73  CALCIUM 7.8* 7.6*   PT/INR No results found for this basename: LABPROT, INR,  in the last 72 hours   Recent Labs Lab 07/17/13 1920 07/18/13 0340 07/19/13 0354  AST 13 11 12   ALT 6 <5 <5  ALKPHOS 81 59 58  BILITOT 0.9 0.7 0.6  PROT 6.3 4.4* 4.5*  ALBUMIN 2.9* 2.0* 1.9*     Lipase     Component Value Date/Time   LIPASE 103* 07/17/2013 1920     Studies/Results: Dg Ugi W/water Sol Cm  07/21/2013   CLINICAL DATA:  Possible perforated duodenal ulcer, evaluate for leak  EXAM: WATER SOLUBLE UPPER GI SERIES  TECHNIQUE: Single-column upper GI series was performed using water soluble contrast.  CONTRAST:  , 12mL OMNIPAQUE IOHEXOL 300 MG/ML  SOLN  COMPARISON:  CT scan 07/17/2013.  FLUOROSCOPY TIME:   4 min  FINDINGS: The visualized esophagus shows normal contour and distensibility. Normal motility.  The stomach shows normal contour and distensibility. Mild decreased gastric motility.  Duodenal bulb and duodenal sweep is unremarkable. No evidence of gastric outlet obstruction. Contrast is noted advancing in duodenum and proximal jejunum. There is no evidence of contrast extravasation. Static image post fluoroscopy demonstrate persistent residual contrast material within stomach.  IMPRESSION: 1. Unremarkable visualized esophagus. No evidence of gastric outlet obstruction. Mild decrease gastric motility with some delay in gastric emptying. No evidence of gastric outlet obstruction. Unremarkable duodenal bulb and duodenal sweep. No evidence of contrast extravasation. Visualized proximal small bowel is unremarkable.   Electronically Signed   By: Lahoma Crocker M.D.   On: 07/21/2013 12:32    Medications: . cloNIDine  0.2 mg Transdermal Weekly  . heparin  5,000 Units Subcutaneous Q8H  . levETIRAcetam  750 mg Intravenous Q12H  . nicotine  14 mg Transdermal Daily  . pantoprazole (PROTONIX) IV  40 mg Intravenous Q12H  . piperacillin-tazobactam (ZOSYN)  IV  3.375 g Intravenous Q8H  . sodium chloride  3 mL Intravenous Q12H    Assessment/Plan 1. Constipation, nausea and vomiting  2. Impending duodenal perforation (inflammatory process 3rd and 4th Portions of the duodenum, possible defect wall of duodenum, fluid/soft tissue complex mesentery-phlegmon vs necrotic tissue 3.6 x 6.8 cm.  UGI:  Unremarkable visualized esophagus.  No evidence of gastric outlet obstruction. Mild decrease gastric motility with some delay in gastric emptying. No evidence of gastric outlet obstruction. Unremarkable duodenal bulb and duodenal sweep. No evidence of contrast extravasation. Visualized proximal small bowel is unremarkable.  3. Heavy alcohol use  4. Tobacco use/COPD  5. Hypertension  6. Hx of neuropathy/gait disorder  7.  Memory disorder  8. Hx of seizures/essential tremors  9. 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  No leaks noted, his WBC is normal.  I think we could start clears. He had his original CT scan on 1/7.  We could repeat CT again also to look ath the fluid/soft tissue phlegmon noted on admit and see how this looks.  I will discuss with Dr. Zella Richer.    LOS: 5 days    Jeffrey Castillo 07/22/2013

## 2013-07-22 NOTE — Progress Notes (Signed)
[facility] PROGRESS NOTE  Jeffrey Castillo XLK:[id number] DOB: 14-Dec-1950 DOA: 07/17/2013 PCP: Rozanna Box, MD  Assessment/Plan: Duodenal ulcer with concern of duodenal perforation  -Appreciate general surgery consultation and followup  -Continue PPI  -Continue Zosyn--> will change to oral antibiotics when tolerating full liquid diet -Continue IV fluids  -07/17/2013 CT abdomen showed mesenteric inflammatory changes in the duodenum with wall thickening and possible wall defect  -Clear liquid diet started 07/22/2013 Mesenteric fluid collection/soft tissue mass  -Discussed with Dr. Daphine Deutscher --repeat CT to reassess "phlegmon"/fluid collections when stable, prior to d/c?? -defer decision for drainage to surgery service -Continue antibiotics for now  -Concerned about abscess versus malignancy  -UGI study 07/21/13--no contrast extravasation  -d/c foley cath  Intra-abdominal fluid collections  -?? Phlegmontous collections from pancreatitis  -as above, discussed with Dr. Daphine Deutscher  Right retroperitoneal discord fluid collection  -May need IR fluid drainage--defer to surgery -Continue antibiotics for now  Alcohol abuse  -Alcohol withdrawal protocol--no signs of DT  -spent speaking with wife and patient about adverse effects of chronic alcohol use  Hypertension  -clonidine patch--restarted 07/19/2013  -Will take 48-72 hours for maximum effect  -Monitor blood pressure closely  Tobacco abuse  -Nicoderm patch  COPD  -Stable presently  -Aerosolized albuterol when necessary  Seizure disorder  -Keppra increased from 500 to 750mg  twice a day  -Restart Lamictal 1 the patient is able to tolerate po  Hypokalemia  -Replete  -Check magnesium  Family Communication: wife at beside  Disposition Plan: Home when medically stable  Antibiotics:  Zosyn 07/17/2013>>>         Procedures/Studies: Ct Abdomen Pelvis W Contrast  07/18/2013   ADDENDUM REPORT: 07/18/2013 08:47  ADDENDUM:  Lab work today demonstrates a lipase of 103. The above findings may simply represent advanced acute pancreatitis with multiple phlegmon collections. The cystic mass in the head of the pancreas should be followed with the repeat CT in 3 months.   Electronically Signed   By: Maryclare Bean M.D.   On: 07/18/2013 08:47   07/18/2013   CLINICAL DATA:  Lower abdominal pain  EXAM: CT ABDOMEN AND PELVIS WITH CONTRAST  TECHNIQUE: Multidetector CT imaging of the abdomen and pelvis was performed using the standard protocol following bolus administration of intravenous contrast.  CONTRAST:  OMNIPAQUE IOHEXOL 300 MG/ML  SOLN  COMPARISON:  05/29/2007  FINDINGS: Spinal cord stimulator is in place extending from the upper lumbar spine at canal into the thoracic posterior spinal canal.  Nonspecific hypodensities are present within the liver. The liver is somewhat cirrhotic. The largest is towards the dome measuring 1.0 cm. They are most likely cysts.  Small gallstone is present. No obvious inflammatory changes of the gallbladder.  There is a significant inflammatory process occurring within the mesenteric. The inflammatory process relatively spares the pancreas. There is some stranding towards the tail of the pancreas. The inflammatory process is most intimately associated with the 3rd and 4th portions of the duodenum. This is associated with wall thickening. There is also a possible defect within the wall of the duodenum. See image 54 of series 4 on the coronal reconstructions.  There is a complex cystic mass within the uncinate process of the pancreas measuring 2.8 x 2.7 cm. Fluid density is seen extending from the duodenum and uncinate process into the mesenteric.  There is a complex fluid and soft tissue mass within the mesentery measuring 3.6 x 6.8 cm on image 44. This may represent phlegmon of or necrotic adenopathy.  Similar densities with some fat density are seen in the posterior right perinephric space. There is a discoid  fluid collection superior to the right kidney in the retroperitoneum measuring 2.8 x 5.5 cm.  Of note, there is no definitive free intraperitoneal gas.  Adrenal glands are within normal limits. Simple cysts in both kidneys.  Sigmoid diverticulosis without definite acute diverticulitis. Bladder is within normal limits.  Degenerative and postoperative changes in the lumbar spine.  IMPRESSION: There is a prominent inflammatory process within the mesenteries of the abdomen as described above. The most critical finding is inflammatory changes intimately associated with the distal duodenum an a possible defect within the duodenum. There is no definite extraperitoneal or intraperitoneal gas however perforation cannot be excluded. There is relative sparing of the inflammatory process to the pancreas.  Other intra-abdominal fluid collections as described.  Complex mass within the mesentery which may represent adenopathy or an inflammatory phlegmon. Similar densities posterior to the right kidney.  Cholelithiasis.  Nonspecific hypodensities in the liver. Because the liver is somewhat cirrhotic. The patient is at risk for liver malignancy. Follow-up outpatient MRI is recommended.  Electronically Signed: By: Maryclare Bean M.D. On: 07/17/2013 20:22   US Venous Img Lower Unilateral Right  07/01/2013   CLINICAL DATA:  Left leg pain  EXAM: Left LOWER EXTREMITY VENOUS DOPPLER ULTRASOUND  TECHNIQUE: Gray-scale sonography with graded compression, as well as color Doppler and duplex ultrasound, were performed to evaluate the deep venous system from the level of the common femoral vein through the popliteal and proximal calf veins. Spectral Doppler was utilized to evaluate flow at rest and with distal augmentation maneuvers.  COMPARISON:  None.  FINDINGS: Thrombus within deep veins:  None visualized.  Compressibility of deep veins:  Normal.  Duplex waveform respiratory phasicity:  Normal.  Duplex waveform response to augmentation:   Normal.  Venous reflux:  None visualized.  Other findings:  None visualized.  IMPRESSION: No acute abnormality is noted.  No deep venous thrombosis is seen.   Electronically Signed   By: Alcide Clever M.D.   On: 07/01/2013 16:24   Dg Abd Acute W/chest  07/17/2013   CLINICAL DATA:  Constipation, nausea, vomiting, rectal bleeding, history hypertension  EXAM: ACUTE ABDOMEN SERIES (ABDOMEN 2 VIEW & CHEST 1 VIEW)  COMPARISON:  Chest radiographs 08/13/2010  FINDINGS: Rotated on chest radiograph to the left.  Normal heart size, mediastinal contours, and pulmonary vascularity.  Lungs appear emphysematous but clear.  No pleural effusion or pneumothorax.  Prior cervical spine fusion.  Intraspinal stimulator leads noted.  Bones diffusely demineralized.  Normal bowel gas pattern.  No bowel dilatation or bowel wall thickening or free intraperitoneal air.  Scattered atherosclerotic calcifications.  No definite urinary tract calcification.  IMPRESSION: Question COPD.  No acute abdominal findings.   Electronically Signed   By: Ulyses Southward M.D.   On: 07/17/2013 20:00   Dg Ugi W/water Sol Cm  07/21/2013   CLINICAL DATA:  Possible perforated duodenal ulcer, evaluate for leak  EXAM: WATER SOLUBLE UPPER GI SERIES  TECHNIQUE: Single-column upper GI series was performed using water soluble contrast.  CONTRAST:  , OMNIPAQUE IOHEXOL 300 MG/ML  SOLN  COMPARISON:  CT scan 07/17/2013.  FLUOROSCOPY TIME:  4 min  FINDINGS: The visualized esophagus shows normal contour and distensibility. Normal motility.  The stomach shows normal contour and distensibility. Mild decreased gastric motility.  Duodenal bulb and duodenal sweep is unremarkable. No evidence of gastric outlet obstruction. Contrast is noted advancing in  duodenum and proximal jejunum. There is no evidence of contrast extravasation. Static image post fluoroscopy demonstrate persistent residual contrast material within stomach.  IMPRESSION: 1. Unremarkable visualized esophagus.  No evidence of gastric outlet obstruction. Mild decrease gastric motility with some delay in gastric emptying. No evidence of gastric outlet obstruction. Unremarkable duodenal bulb and duodenal sweep. No evidence of contrast extravasation. Visualized proximal small bowel is unremarkable.   Electronically Signed   By: Natasha Mead M.D.   On: 07/21/2013 12:32         Subjective: Patient is tolerating clear liquids today. Denies any fevers, chills, chest pain, shortness breath, nausea, vomiting, diarrhea, dysuria. He has some intermittent abdominal discomfort, but it is improved overall.  Objective: Filed Vitals:   07/21/13 2109 07/22/13 0141 07/22/13 0606 07/22/13 1326  BP: 144/72 151/58 125/66 117/62  Pulse: 65 77 56 67  Temp: 98.7 F (37.1 C) 97.4 F (36.3 C) 98.1 F (36.7 C) 98.1 F (36.7 C)  TempSrc: Oral Oral Oral Oral  Resp: 19 19 18 18   Height:      Weight:   66.6 kg (146 lb 13.2 oz)   SpO2: 96% 97% 95% 95%    Intake/Output Summary (Last 24 hours) at 07/22/13 1857 Last data filed at 07/22/13 1849  Gross per 24 hour  Intake 1811.17 ml  Output    800 ml  Net 1011.17 ml   Weight change: -1.4 kg (-3 lb 1.4 oz) Exam:   General:  Pt is alert, follows commands appropriately, not in acute distress  HEENT: No icterus, No thrush, No neck mass, [place]/AT  Cardiovascular: RRR, S1/S2, no rubs, no gallops  Respiratory: CTA bilaterally, no wheezing, no crackles, no rhonchi  Abdomen: Soft/+BS, non tender, non distended, no guarding  Extremities: No edema, No lymphangitis, No petechiae, No rashes, no synovitis  Data Reviewed: Basic Metabolic Panel:  Recent Labs Lab 07/18/13 0340 07/19/13 0354 07/20/13 0332 07/21/13 0344 07/22/13 0520  NA 136* 138 138 137 136*  K 4.4 3.5* 2.9* 3.2* 3.3*  CL 102 103 102 101 103  CO2 26 23 25 24 22   GLUCOSE 83 85 85 104* 108*  BUN 12 7 <3* <3* <3*  CREATININE 0.73 0.69 0.69 0.63 0.73  CALCIUM 7.7* 7.7* 7.9* 7.8* 7.6*   Liver Function  Tests:  Recent Labs Lab 07/17/13 1920 07/18/13 0340 07/19/13 0354  AST 13 11 12   ALT 6 <5 <5  ALKPHOS 81 59 58  BILITOT 0.9 0.7 0.6  PROT 6.3 4.4* 4.5*  ALBUMIN 2.9* 2.0* 1.9*    Recent Labs Lab 07/17/13 1920  LIPASE 103*   No results found for this basename: AMMONIA,  in the last 168 hours CBC:  Recent Labs Lab 07/17/13 1920  07/18/13 0340 07/19/13 0354 07/20/13 0332 07/21/13 0344 07/22/13 0520  WBC 22.0*  --  13.5* 11.3* 9.7 11.3* 8.2  NEUTROABS 18.3*  --   --  8.3*  --   --   --   HGB 15.7  < > 12.4* 11.6* 12.8* 13.2 11.9*  HCT 44.5  < > 36.7* 33.6* 37.7* 37.3* 34.5*  MCV 108.3*  --  109.6* 107.7* 107.4* 103.6* 104.9*  PLT 271  --  208 204 234 265 240  < > = values in this interval not displayed. Cardiac Enzymes: No results found for this basename: CKTOTAL, CKMB, CKMBINDEX, TROPONINI,  in the last 168 hours BNP: No components found with this basename: POCBNP,  CBG:  Recent Labs Lab 07/21/13 1830 07/21/13 2348 07/22/13 0602  07/22/13 1141 07/22/13 1737  GLUCAP 93 96 96 129* 101*    Recent Results (from the past 240 hour(s))  MRSA PCR SCREENING     Status: None   Collection Time    07/18/13  2:22 AM      Result Value Range Status   MRSA by PCR NEGATIVE  NEGATIVE Final   Comment:            The GeneXpert MRSA Assay (FDA     approved for NASAL specimens     only), is one component of a     comprehensive MRSA colonization     surveillance program. It is not     intended to diagnose MRSA     infection nor to guide or     monitor treatment for     MRSA infections.     Scheduled Meds: . cloNIDine  0.2 mg Transdermal Weekly  . heparin  5,000 Units Subcutaneous Q8H  . levETIRAcetam  750 mg Intravenous Q12H  . nicotine  14 mg Transdermal Daily  . pantoprazole (PROTONIX) IV  40 mg Intravenous Q12H  . piperacillin-tazobactam (ZOSYN)  IV  3.375 g Intravenous Q8H  . sodium chloride  3 mL Intravenous Q12H   Continuous Infusions: . dextrose 5 % and  0.9 % NaCl with KCl 20 mEq/L 100 mL/hr at 07/22/13 1349     Jeffrey Swanton, DO  [facility] Pager [phone]  If 7PM-7AM, please contact night-coverage [email or web address] Password [facility] 07/22/2013, 6:57 PM   LOS: 5 days

## 2013-07-23 LAB — BASIC METABOLIC PANEL
BUN: <3 mg/dL — ABNORMAL LOW (ref 6–23)
CO2: 23 mEq/L (ref 19–32)
Calcium: 7.6 mg/dL — ABNORMAL LOW (ref 8.4–10.5)
Chloride: 105 mEq/L (ref 96–112)
Creatinine, Ser: 0.82 mg/dL (ref 0.50–1.35)
GFR calc Af Amer: >90 mL/min (ref >90)
GFR calc non Af Amer: >90 mL/min (ref >90)
Glucose, Bld: 109 mg/dL — ABNORMAL HIGH (ref 70–99)
Potassium: 3.6 mEq/L — ABNORMAL LOW (ref 3.7–5.3)
Sodium: 138 mEq/L (ref 137–147)

## 2013-07-23 LAB — GLUCOSE, CAPILLARY
Glucose-Capillary: 107 mg/dL — ABNORMAL HIGH (ref 70–99)
Glucose-Capillary: 109 mg/dL — ABNORMAL HIGH (ref 70–99)
Glucose-Capillary: 88 mg/dL (ref 70–99)
Glucose-Capillary: 96 mg/dL (ref 70–99)

## 2013-07-23 LAB — CBC
HCT: 33.2 % — ABNORMAL LOW (ref 39.0–52.0)
Hemoglobin: 11.7 g/dL — ABNORMAL LOW (ref 13.0–17.0)
MCH: 37 pg — ABNORMAL HIGH (ref 26.0–34.0)
MCHC: 35.2 g/dL (ref 30.0–36.0)
MCV: 105.1 fL — ABNORMAL HIGH (ref 78.0–100.0)
Platelets: 266 10*3/uL (ref 150–400)
RBC: 3.16 MIL/uL — ABNORMAL LOW (ref 4.22–5.81)
RDW: 13.8 % (ref 11.5–15.5)
WBC: 6.7 10*3/uL (ref 4.0–10.5)

## 2013-07-23 LAB — MAGNESIUM: Magnesium: 1.4 mg/dL — ABNORMAL LOW (ref 1.5–2.5)

## 2013-07-23 MED ORDER — CIPROFLOXACIN HCL 500 MG PO TABS
500.0000 mg | ORAL_TABLET | Freq: Two times a day (BID) | ORAL | Status: DC
  Administered 2013-07-23 – 2013-07-24 (×2): 500 mg via ORAL
  Filled 2013-07-23 (×4): qty 1

## 2013-07-23 MED ORDER — PANTOPRAZOLE SODIUM 40 MG PO TBEC: 40.0000 mg | DELAYED_RELEASE_TABLET | Freq: Two times a day (BID) | ORAL | Status: DC

## 2013-07-23 MED ORDER — LEVETIRACETAM 750 MG PO TABS
750.0000 mg | ORAL_TABLET | Freq: Two times a day (BID) | ORAL | Status: DC
  Administered 2013-07-23 – 2013-07-24 (×2): 750 mg via ORAL
  Filled 2013-07-23 (×3): qty 1

## 2013-07-23 MED ORDER — METRONIDAZOLE 500 MG PO TABS: 500.0000 mg | ORAL_TABLET | Freq: Two times a day (BID) | ORAL | Status: DC

## 2013-07-23 MED ORDER — LEVETIRACETAM 500 MG PO TABS
500.0000 mg | ORAL_TABLET | Freq: Two times a day (BID) | ORAL | Status: DC
  Filled 2013-07-23: qty 1

## 2013-07-23 MED ORDER — MAGNESIUM SULFATE 40 MG/ML IJ SOLN
2.0000 g | Freq: Once | INTRAMUSCULAR | Status: AC
  Administered 2013-07-23: 2 g via INTRAVENOUS
  Filled 2013-07-23: qty 50

## 2013-07-23 MED ORDER — LEVETIRACETAM 750 MG PO TABS: 750.0000 mg | ORAL_TABLET | Freq: Two times a day (BID) | ORAL | Status: DC

## 2013-07-23 MED ORDER — METRONIDAZOLE 500 MG PO TABS: 500.0000 mg | ORAL_TABLET | Freq: Three times a day (TID) | ORAL | Status: DC

## 2013-07-23 MED ORDER — LAMOTRIGINE 25 MG PO TABS
50.0000 mg | ORAL_TABLET | Freq: Two times a day (BID) | ORAL | Status: DC
  Filled 2013-07-23: qty 2

## 2013-07-23 MED ORDER — LAMOTRIGINE 25 MG PO TABS
25.0000 mg | ORAL_TABLET | Freq: Every day | ORAL | Status: DC
  Administered 2013-07-23 – 2013-07-24 (×2): 25 mg via ORAL
  Filled 2013-07-23 (×2): qty 1

## 2013-07-23 MED ORDER — PANTOPRAZOLE SODIUM 40 MG PO TBEC
40.0000 mg | DELAYED_RELEASE_TABLET | Freq: Two times a day (BID) | ORAL | Status: DC
  Administered 2013-07-23 – 2013-07-24 (×2): 40 mg via ORAL
  Filled 2013-07-23 (×3): qty 1

## 2013-07-23 MED ORDER — LEVETIRACETAM 500 MG PO TABS: 500.0000 mg | ORAL_TABLET | Freq: Two times a day (BID) | ORAL | Status: DC

## 2013-07-23 MED ORDER — METRONIDAZOLE 500 MG PO TABS
500.0000 mg | ORAL_TABLET | Freq: Three times a day (TID) | ORAL | Status: DC
  Administered 2013-07-23 – 2013-07-24 (×2): 500 mg via ORAL
  Filled 2013-07-23 (×5): qty 1

## 2013-07-23 MED ORDER — CIPROFLOXACIN HCL 500 MG PO TABS: 500.0000 mg | ORAL_TABLET | Freq: Two times a day (BID) | ORAL | Status: DC

## 2013-07-23 MED ORDER — LAMOTRIGINE 25 MG PO TABS: 25.0000 mg | ORAL_TABLET | Freq: Every day | ORAL | Status: DC

## 2013-07-23 NOTE — Discharge Summary (Addendum)
Physician Discharge Summary  ABDON MOELLER UJW:[id number] DOB: [birth year] DOA: 07/17/2013  PCP: Rozanna Box, MD  Admit date: 07/17/2013 Discharge date: 07/23/2013  Recommendations for Outpatient Follow-up:  1. Pt will need to follow up with PCP in 2 weeks post discharge 2. Please obtain BMP to evaluate electrolytes and kidney function 3. Please also check CBC to evaluate Hg and Hct levels 4. Follow up Dr. Abbey Chatters in 2 weeks   Discharge Diagnoses:  Principal Problem:   Suspected Duodenal ulcer perforation Active Problems:   HYPERTENSION   C O P D   SEIZURE DISORDER, HX OF   Alcohol abuse Duodenal ulcer with concern of duodenal perforation  -Appreciate general surgery consultation and followup  -Continue PPI bid -Continue Zosyn--> changed to oral Cipro and Flagyl 07/23/2013 -Plan 7 additional dose of Cipro and Flagyl at the time of discharge which will complete 14 days of antibiotics -Continue IV fluids --> saline locked on the patient tolerated oral intake -07/17/2013 CT abdomen showed mesenteric inflammatory changes in the duodenum with wall thickening and possible wall defect  -Clear liquid diet started 07/22/2013 when UGI study was neg for extravasation -advanced to full liquids on 07/23/13  Mesenteric fluid collection/soft tissue mass  -Discussed with Dr. Abbey Chatters --repeat CT to reassess "phlegmon"/fluid collections in 2-3 months -Surgery recommended holding off on CT biopsy/drainage at this time   -UGI study 07/21/13--no contrast extravasation-->started clear liquids -d/c foley cath  Intra-abdominal fluid collections  -?? Phlegmontous collections from pancreatitis  -as above, discussed with Dr. Abbey Chatters Right retroperitoneal discoid fluid collection  -May need IR fluid drainage ultimately--defer to surgery  -Continue antibiotics for now  Alcohol abuse  -Alcohol withdrawal protocol--no signs of DT  -spent speaking with wife and patient about adverse effects  of chronic alcohol use  Hypertension  -clonidine patch--restarted 07/19/2013  -BP improved Tobacco abuse  -Nicoderm patch  -Tobacco cessation discussed COPD  -Stable presently  -Aerosolized albuterol when necessary  Seizure disorder  -Keppra increased from 500 to 750mg  twice a day since pt was npo and could not take lamictal -Restart Lamictal now that patient is able to take po -Because Lamictal had been stopped for one week, the patient will need to be retitrated back to his previous dose to avoid rash.  He will stay on keppra 750mg  bid dose until he has titrated back up to his original Lamictal dose -He will start Lamictal 25 mg once daily x2 weeks, then Lamictal 50 mg once daily x2 weeks, then Lamictal 50 mg twice a day -continue keppra 750mg  bid until lamictal titrated back up to previous dose -decrease keppra to 500mg  bid when lamictal is back to 50mg  bid Hypokalemia  -Replete  Hypomagnesemia -replete Family Communication: wife at beside  Disposition Plan: Home when medically stable  Antibiotics:  Zosyn 07/17/2013>>>07/23/13   Discharge Condition: Stable  Disposition:  Follow-up Information   Follow up with ROSENBOWER,TODD J, MD In 2 weeks.   Specialty:  General Surgery   Contact information:   [address] [place] [address] [phone]      discharge home  Diet: Low fiber Wt Readings from Last 3 Encounters:  07/23/13 67.1 kg (147 lb 14.9 oz)  04/16/13 68.947 kg (152 lb)  01/31/13 69.854 kg (154 lb)    History of present illness:  63 y.o. male with Past medical history of hypertension, seizure disorder, peripheral neuropathy, alcohol abuse.  The patient presents with complaints of right upper quadrant pain that has been ongoing on  and off since last 4 months. The history has been obtained from both patient and his wife. As per the wife,  the patient usually drinks 24 ounces of whiskey in a day which he has cut back to 6 ounces of whiskey in a  day since last 2-1/2 month.  As per the patient since last 4 months he has been having on and off pain in the right upper quadrant which radiates to his right back and eventually settled down to his lower abdomen and pelvis this episode is associated with constipation and is followed by diarrhea without any blood. During his episode the patient also had episodes of vomiting and therefore stopped eating.  He mentions this episode have been lasting for 2 weeks roughly and reoccurring every 4 weeks.  Since this time it was his fourth or fifth episode therefore he went to see his PCP who referred him to the hospital for further evaluation.  Current episode has been ongoing since last 4 days with severe pain in his right upper quadrant associated with acid reflux nausea and vomiting. CT abdomen and pelvis at the time of admission was concerning for a duodenal ulceration with possible perforation. General surgery was consulted. The patient was made n.p.o. He was started on IV fluids and intravenous antibiotics. His CT also showed a discrete fluid collection about the right kidney as well as a complex fluid collection within the mesentery. The patient was placed on alcohol withdrawal protocol. He did not experience any withdrawal. His seizure medications were adjusted with his Keppra dose increased because he was not able to take Lamictal due to his n.p.o. status. The patient clinically improved with IV Zosyn. Upper GI study was performed after several days of being n.p.o. When his upper GI study was negative for any contrast extravasation, the patient was started on a clear liquid diet. His diet was gradually advanced and he tolerated it without any worsening abdominal pain. After discussion with general surgery regarding the possibility of CT-guided biopsy and drainage of his fluid collections, they recommended conservative treatment at this time with antibiotics. They recommended repeat CT in 2-3 months unless the  patient begins to experience abdominal pain and fevers. As the patient was tolerating his diet, his Lamictal was reintroduced. His Keppra dose was decreased back to his previous dose. His diet was advanced and he was able to tolerate without worsening abdominal pain or vomiting. His IV Zosyn was converted to oral Cipro and Flagyl which he will take for an additional 7 days to complete 14 days of therapy.     Consultants: General surgery  Discharge Exam: Filed Vitals:   07/23/13 1333  BP: 121/69  Pulse: 55  Temp: 99 F (37.2 C)  Resp: 19   Filed Vitals:   07/22/13 1326 07/22/13 2130 07/23/13 0547 07/23/13 1333  BP: 117/62 125/69 138/68 121/69  Pulse: 67 64 58 55  Temp: 98.1 F (36.7 C) 99 F (37.2 C) 97.8 F (36.6 C) 99 F (37.2 C)  TempSrc: Oral Oral Oral Oral  Resp: 18 18 18 19   Height:      Weight:   67.1 kg (147 lb 14.9 oz)   SpO2: 95% 95% 93% 96%   General: A&O x 3, NAD, pleasant, cooperative Cardiovascular: RRR, no rub, no gallop, no S3 Respiratory: CTAB, no wheeze, no rhonchi Abdomen:soft, nontender, nondistended, positive bowel sounds Extremities: No edema, No lymphangitis, no petechiae  Discharge Instructions      Discharge Orders   Future Appointments Provider  Department Dept Phone   10/21/2013 10:30 AM Nilda Riggs, NP [facility] [phone]   Future Orders Complete By Expires   Discharge instructions  As directed    Comments:     Lamictal (lamotrigine)--Take 25 mg (1 tablet) once daily x 2 weeks, then 50mg  (2 tablets) once daily x 2 weeks, then 50mg  (2 tablets) two times a day x 2 weeks. Keppra--750mg  two times a day until you have titrated back to your previous lamictal dose.  Restart Keppra 500mg  when you have titrated back to your original Lamictal dose   Increase activity slowly  As directed        Medication List    STOP taking these medications       doxycycline 100 MG tablet  Commonly known as:  VIBRA-TABS       TAKE these medications       ciprofloxacin 500 MG tablet  Commonly known as:  CIPRO  Take 1 tablet (500 mg total) by mouth 2 (two) times daily.     cloNIDine 0.2 mg/24hr patch  Commonly known as:  CATAPRES - Dosed in mg/24 hr  Place 1 patch onto the skin once a week.     lamoTRIgine 25 MG tablet  Commonly known as:  LAMICTAL  Take 1 tablet (25 mg total) by mouth daily. Take 25 mg (1 tablet) once daily x 2 weeks, then 50mg  (2 tablets) once daily x 2 weeks, then 50mg  (2 tablets) two times a day x 2 weeks.     levETIRAcetam 750 MG tablet  Commonly known as:  KEPPRA  Take 1 tablet (750 mg total) by mouth 2 (two) times daily. Take 750mg  dose until you have titrated back to your original lamotrigine dose     levETIRAcetam 500 MG tablet  Commonly known as:  KEPPRA  Take 1 tablet (500 mg total) by mouth 2 (two) times daily. Restart this dose only AFTER you have titrated back to your previous lamotrigine dose     metroNIDAZOLE 500 MG tablet  Commonly known as:  FLAGYL  Take 1 tablet (500 mg total) by mouth every 8 (eight) hours.     pantoprazole 40 MG tablet  Commonly known as:  PROTONIX  Take 1 tablet (40 mg total) by mouth 2 (two) times daily.         The results of significant diagnostics from this hospitalization (including imaging, microbiology, ancillary and laboratory) are listed below for reference.    Significant Diagnostic Studies: Ct Abdomen Pelvis W Contrast  07/18/2013   ADDENDUM REPORT: 07/18/2013 08:47  ADDENDUM: Lab work today demonstrates a lipase of 103. The above findings may simply represent advanced acute pancreatitis with multiple phlegmon collections. The cystic mass in the head of the pancreas should be followed with the repeat CT in 3 months.   Electronically Signed   By: Maryclare Bean M.D.   On: 07/18/2013 08:47   07/18/2013   CLINICAL DATA:  Lower abdominal pain  EXAM: CT ABDOMEN AND PELVIS WITH CONTRAST  TECHNIQUE: Multidetector CT imaging of the abdomen and  pelvis was performed using the standard protocol following bolus administration of intravenous contrast.  CONTRAST:  OMNIPAQUE IOHEXOL 300 MG/ML  SOLN  COMPARISON:  05/29/2007  FINDINGS: Spinal cord stimulator is in place extending from the upper lumbar spine at canal into the thoracic posterior spinal canal.  Nonspecific hypodensities are present within the liver. The liver is somewhat cirrhotic. The largest is towards the dome measuring 1.0 cm. They are  most likely cysts.  Small gallstone is present. No obvious inflammatory changes of the gallbladder.  There is a significant inflammatory process occurring within the mesenteric. The inflammatory process relatively spares the pancreas. There is some stranding towards the tail of the pancreas. The inflammatory process is most intimately associated with the 3rd and 4th portions of the duodenum. This is associated with wall thickening. There is also a possible defect within the wall of the duodenum. See image 54 of series 4 on the coronal reconstructions.  There is a complex cystic mass within the uncinate process of the pancreas measuring 2.8 x 2.7 cm. Fluid density is seen extending from the duodenum and uncinate process into the mesenteric.  There is a complex fluid and soft tissue mass within the mesentery measuring 3.6 x 6.8 cm on image 44. This may represent phlegmon of or necrotic adenopathy. Similar densities with some fat density are seen in the posterior right perinephric space. There is a discoid fluid collection superior to the right kidney in the retroperitoneum measuring 2.8 x 5.5 cm.  Of note, there is no definitive free intraperitoneal gas.  Adrenal glands are within normal limits. Simple cysts in both kidneys.  Sigmoid diverticulosis without definite acute diverticulitis. Bladder is within normal limits.  Degenerative and postoperative changes in the lumbar spine.  IMPRESSION: There is a prominent inflammatory process within the mesenteries of  the abdomen as described above. The most critical finding is inflammatory changes intimately associated with the distal duodenum an a possible defect within the duodenum. There is no definite extraperitoneal or intraperitoneal gas however perforation cannot be excluded. There is relative sparing of the inflammatory process to the pancreas.  Other intra-abdominal fluid collections as described.  Complex mass within the mesentery which may represent adenopathy or an inflammatory phlegmon. Similar densities posterior to the right kidney.  Cholelithiasis.  Nonspecific hypodensities in the liver. Because the liver is somewhat cirrhotic. The patient is at risk for liver malignancy. Follow-up outpatient MRI is recommended.  Electronically Signed: By: Maryclare Bean M.D. On: 07/17/2013 20:22   US Venous Img Lower Unilateral Right  07/01/2013   CLINICAL DATA:  Left leg pain  EXAM: Left LOWER EXTREMITY VENOUS DOPPLER ULTRASOUND  TECHNIQUE: Gray-scale sonography with graded compression, as well as color Doppler and duplex ultrasound, were performed to evaluate the deep venous system from the level of the common femoral vein through the popliteal and proximal calf veins. Spectral Doppler was utilized to evaluate flow at rest and with distal augmentation maneuvers.  COMPARISON:  None.  FINDINGS: Thrombus within deep veins:  None visualized.  Compressibility of deep veins:  Normal.  Duplex waveform respiratory phasicity:  Normal.  Duplex waveform response to augmentation:  Normal.  Venous reflux:  None visualized.  Other findings:  None visualized.  IMPRESSION: No acute abnormality is noted.  No deep venous thrombosis is seen.   Electronically Signed   By: Alcide Clever M.D.   On: 07/01/2013 16:24   Dg Abd Acute W/chest  07/17/2013   CLINICAL DATA:  Constipation, nausea, vomiting, rectal bleeding, history hypertension  EXAM: ACUTE ABDOMEN SERIES (ABDOMEN 2 VIEW & CHEST 1 VIEW)  COMPARISON:  Chest radiographs 08/13/2010  FINDINGS:  Rotated on chest radiograph to the left.  Normal heart size, mediastinal contours, and pulmonary vascularity.  Lungs appear emphysematous but clear.  No pleural effusion or pneumothorax.  Prior cervical spine fusion.  Intraspinal stimulator leads noted.  Bones diffusely demineralized.  Normal bowel gas pattern.  No bowel dilatation  or bowel wall thickening or free intraperitoneal air.  Scattered atherosclerotic calcifications.  No definite urinary tract calcification.  IMPRESSION: Question COPD.  No acute abdominal findings.   Electronically Signed   By: Ulyses Southward M.D.   On: 07/17/2013 20:00   Dg Ugi W/water Sol Cm  07/21/2013   CLINICAL DATA:  Possible perforated duodenal ulcer, evaluate for leak  EXAM: WATER SOLUBLE UPPER GI SERIES  TECHNIQUE: Single-column upper GI series was performed using water soluble contrast.  CONTRAST:  , OMNIPAQUE IOHEXOL 300 MG/ML  SOLN  COMPARISON:  CT scan 07/17/2013.  FLUOROSCOPY TIME:  4 min  FINDINGS: The visualized esophagus shows normal contour and distensibility. Normal motility.  The stomach shows normal contour and distensibility. Mild decreased gastric motility.  Duodenal bulb and duodenal sweep is unremarkable. No evidence of gastric outlet obstruction. Contrast is noted advancing in duodenum and proximal jejunum. There is no evidence of contrast extravasation. Static image post fluoroscopy demonstrate persistent residual contrast material within stomach.  IMPRESSION: 1. Unremarkable visualized esophagus. No evidence of gastric outlet obstruction. Mild decrease gastric motility with some delay in gastric emptying. No evidence of gastric outlet obstruction. Unremarkable duodenal bulb and duodenal sweep. No evidence of contrast extravasation. Visualized proximal small bowel is unremarkable.   Electronically Signed   By: Natasha Mead M.D.   On: 07/21/2013 12:32     Microbiology: Recent Results (from the past 240 hour(s))  MRSA PCR SCREENING     Status: None    Collection Time    07/18/13  2:22 AM      Result Value Range Status   MRSA by PCR NEGATIVE  NEGATIVE Final   Comment:            The GeneXpert MRSA Assay (FDA     approved for NASAL specimens     only), is one component of a     comprehensive MRSA colonization     surveillance program. It is not     intended to diagnose MRSA     infection nor to guide or     monitor treatment for     MRSA infections.     Labs: Basic Metabolic Panel:  Recent Labs Lab 07/19/13 0354 07/20/13 0332 07/21/13 0344 07/22/13 0520 07/23/13 0432  NA 138 138 137 136* 138  K 3.5* 2.9* 3.2* 3.3* 3.6*  CL 103 102 101 103 105  CO2 23 25 24 22 23   GLUCOSE 85 85 104* 108* 109*  BUN 7 <3* <3* <3* <3*  CREATININE 0.69 0.69 0.63 0.73 0.82  CALCIUM 7.7* 7.9* 7.8* 7.6* 7.6*  MG  --   --   --   --  1.4*   Liver Function Tests:  Recent Labs Lab 07/17/13 1920 07/18/13 0340 07/19/13 0354  AST 13 11 12   ALT 6 <5 <5  ALKPHOS 81 59 58  BILITOT 0.9 0.7 0.6  PROT 6.3 4.4* 4.5*  ALBUMIN 2.9* 2.0* 1.9*    Recent Labs Lab 07/17/13 1920  LIPASE 103*   No results found for this basename: AMMONIA,  in the last 168 hours CBC:  Recent Labs Lab 07/17/13 1920  07/19/13 0354 07/20/13 0332 07/21/13 0344 07/22/13 0520 07/23/13 0432  WBC 22.0*  < > 11.3* 9.7 11.3* 8.2 6.7  NEUTROABS 18.3*  --  8.3*  --   --   --   --   HGB 15.7  < > 11.6* 12.8* 13.2 11.9* 11.7*  HCT 44.5  < > 33.6* 37.7* 37.3* 34.5*  33.2*  MCV 108.3*  < > 107.7* 107.4* 103.6* 104.9* 105.1*  PLT 271  < > 204 234 265 240 266  < > = values in this interval not displayed. Cardiac Enzymes: No results found for this basename: CKTOTAL, CKMB, CKMBINDEX, TROPONINI,  in the last 168 hours BNP: No components found with this basename: POCBNP,  CBG:  Recent Labs Lab 07/22/13 1737 07/23/13 0026 07/23/13 0554 07/23/13 1157 07/23/13 1715  GLUCAP 101* 96 109* 88 107*    Time coordinating discharge:  Greater than 30  minutes  Signed:  Leyna Vanderkolk, DO [facility] Pager: [phone] 07/23/2013, 7:21 PM

## 2013-07-23 NOTE — Progress Notes (Signed)
Subjective: No pain or difficulty with clear liquids.  Objective: Vital signs in last 24 hours: Temp:  [97.8 F (36.6 C)-99 F (37.2 C)] 97.8 F (36.6 C) (01/13 0547) Pulse Rate:  [58-67] 58 (01/13 0547) Resp:  [18] 18 (01/13 0547) BP: (117-138)/(62-69) 138/68 mmHg (01/13 0547) SpO2:  [93 %-95 %] 93 % (01/13 0547) Weight:  [147 lb 14.9 oz (67.1 kg)] 147 lb 14.9 oz (67.1 kg) (01/13 0547) Last BM Date: 07/22/13  Intake/Output from previous day: 01/12 0701 - 01/13 0700 In: 3360.3 [P.O.:660; I.V.:2443.3; IV Piggyback:257] Out: 1675 [Urine:1675] Intake/Output this shift:    PE: General- In NAD Abdomen-soft, nontender  Lab Results:   Recent Labs  07/22/13 0520 07/23/13 0432  WBC 8.2 6.7  HGB 11.9* 11.7*  HCT 34.5* 33.2*  PLT 240 266   BMET  Recent Labs  07/22/13 0520 07/23/13 0432  NA 136* 138  K 3.3* 3.6*  CL 103 105  CO2 22 23  GLUCOSE 108* 109*  BUN <3* <3*  CREATININE 0.73 0.82  CALCIUM 7.6* 7.6*   PT/INR No results found for this basename: LABPROT, INR,  in the last 72 hours Comprehensive Metabolic Panel:    Component Value Date/Time   NA 138 07/23/2013 0432   K 3.6* 07/23/2013 0432   CL 105 07/23/2013 0432   CO2 23 07/23/2013 0432   BUN <3* 07/23/2013 0432   CREATININE 0.82 07/23/2013 0432   GLUCOSE 109* 07/23/2013 0432   CALCIUM 7.6* 07/23/2013 0432   AST 12 07/19/2013 0354   ALT <5 07/19/2013 0354   ALKPHOS 58 07/19/2013 0354   BILITOT 0.6 07/19/2013 0354   PROT 4.5* 07/19/2013 0354   ALBUMIN 1.9* 07/19/2013 0354     Studies/Results: Dg Ugi W/water Sol Cm  07/21/2013   CLINICAL DATA:  Possible perforated duodenal ulcer, evaluate for leak  EXAM: WATER SOLUBLE UPPER GI SERIES  TECHNIQUE: Single-column upper GI series was performed using water soluble contrast.  CONTRAST:  , 158mL OMNIPAQUE IOHEXOL 300 MG/ML  SOLN  COMPARISON:  CT scan 07/17/2013.  FLUOROSCOPY TIME:  4 min  FINDINGS: The visualized esophagus shows normal contour and distensibility. Normal  motility.  The stomach shows normal contour and distensibility. Mild decreased gastric motility.  Duodenal bulb and duodenal sweep is unremarkable. No evidence of gastric outlet obstruction. Contrast is noted advancing in duodenum and proximal jejunum. There is no evidence of contrast extravasation. Static image post fluoroscopy demonstrate persistent residual contrast material within stomach.  IMPRESSION: 1. Unremarkable visualized esophagus. No evidence of gastric outlet obstruction. Mild decrease gastric motility with some delay in gastric emptying. No evidence of gastric outlet obstruction. Unremarkable duodenal bulb and duodenal sweep. No evidence of contrast extravasation. Visualized proximal small bowel is unremarkable.   Electronically Signed   By: Lahoma Crocker M.D.   On: 07/21/2013 12:32    Anti-infectives: Anti-infectives   Start     Dose/Rate Route Frequency Ordered Stop   07/18/13 0600  piperacillin-tazobactam (ZOSYN) IVPB 3.375 g     3.375 g 12.5 mL/hr over 240 Minutes Intravenous Every 8 hours 07/17/13 2300     07/17/13 2100  piperacillin-tazobactam (ZOSYN) IVPB 3.375 g     3.375 g 100 mL/hr over 30 Minutes Intravenous  Once 07/17/13 2029 07/17/13 2228      Assessment Principal Problem:   Suspected Duodenal ulcer perforation-clinically improving.  Cystic lesion of pancreas-needs f/u CT in 3 months    Alcohol abuse    LOS: 6 days   Plan: Advance to full  liquid diet.   Keats Kingry J 07/23/2013

## 2013-07-24 DIAGNOSIS — G40309 Generalized idiopathic epilepsy and epileptic syndromes, not intractable, without status epilepticus: Secondary | ICD-10-CM

## 2013-07-24 LAB — GLUCOSE, CAPILLARY
Glucose-Capillary: 103 mg/dL — ABNORMAL HIGH (ref 70–99)
Glucose-Capillary: 105 mg/dL — ABNORMAL HIGH (ref 70–99)

## 2013-07-24 LAB — BASIC METABOLIC PANEL
BUN: <3 mg/dL — ABNORMAL LOW (ref 6–23)
CO2: 23 mEq/L (ref 19–32)
Calcium: 7.5 mg/dL — ABNORMAL LOW (ref 8.4–10.5)
Chloride: 101 mEq/L (ref 96–112)
Creatinine, Ser: 0.81 mg/dL (ref 0.50–1.35)
GFR calc Af Amer: >90 mL/min (ref >90)
GFR calc non Af Amer: >90 mL/min (ref >90)
Glucose, Bld: 95 mg/dL (ref 70–99)
Potassium: 3.6 mEq/L — ABNORMAL LOW (ref 3.7–5.3)
Sodium: 135 mEq/L — ABNORMAL LOW (ref 137–147)

## 2013-07-24 LAB — MAGNESIUM: Magnesium: 1.9 mg/dL (ref 1.5–2.5)

## 2013-07-24 NOTE — Discharge Summary (Signed)
Physician Discharge Summary  Jeffrey Castillo [id number] DOB: 1950/11/02 DOA: 07/17/2013  PCP: Jeffrey Fennel, MD  Admit date: 07/17/2013 Discharge date: 07/24/2013  Recommendations for Outpatient Follow-up:  1. Pt will need to follow up with PCP in 2 weeks post discharge 2. Please obtain BMP to evaluate electrolytes and kidney function 3. Please also check CBC to evaluate Hg and Hct levels 4. Follow up Dr. Marcello Moores in 2 weeks 5. Repeat CT abd/pelvis in 2 months per surgery to f/u phlegmon   Discharge Diagnoses:  Principal Problem:   Suspected Duodenal ulcer perforation Active Problems:   HYPERTENSION   C O P D   SEIZURE DISORDER, HX OF   Alcohol abuse Duodenal ulcer with concern of duodenal perforation Patient admitted, made NPO and placed on IVF.  General surgery consulted.  He was started on zosyn and transitioned to cipro and flagyl due to 07/17/2013 CT abdomen which showed mesenteric inflammatory changes in the duodenum with wall thickening and possible wall defect.  Clear liquid diet started 07/22/2013 when UGI study was neg for extravasation.  He has tolerated his full liquids diet and will advance to low fiber and may continue low fiber diet at home.  He has not had pain and his stools are consistent and formed.  F/u with general surgery in 2 weeks.    Mesenteric fluid collection/soft tissue mass, intraabdominal fluid collections, and right retroperitoneal discoid fluid collection  Discussed with Dr. Zella Richer --repeat CT to reassess "phlegmon"/fluid collections in 2-3 months.  Surgery recommended holding off on CT biopsy/drainage at this time.  UGI study 07/21/13--no contrast extravasation.  He will follow up with surgery in 2 weeks.  He will continue antibiotics for one more week.  Alcohol abuse placed on "CIWA and no signs of DT.  Counseled about alcohol abuse.    Hypertension  clonidine patch--restarted 07/19/2013.    Tobacco abuse Nicoderm patch and Tobacco cessation  discussed  COPD, remained stable.  Aerosolized albuterol when necessary   Seizure disorder  Keppra increased from 500 to 750mg  twice a day since pt was npo and could not take lamictal initially, however, lamictal has been restarted and he will bridge from depakote to lamictal.  He will stay on keppra 750mg  bid dose until he has titrated back up to his original Lamictal dose and then he will resume keppra 500mg  bid.  Start Lamictal 25 mg once daily x2 weeks, then Lamictal 50 mg once daily x2 weeks, then Lamictal 50 mg twice a day.    Hypokalemia due to NPO/CLD.  Repleted  Hypomagnesemia, due to NPO/CLD.  Repleted   Antibiotics:  Zosyn 07/17/2013>>>07/23/13 cipro + flagyl 1/13 >>   Discharge Condition: Stable  Disposition:  Follow-up Information   Follow up with Rosario Adie., MD. Schedule an appointment as soon as possible for a visit in 2 weeks.   Specialty:  General Surgery   Contact information:   [address] [place] [address] [phone]       Follow up with BABAOFF, MARC E, MD. Schedule an appointment as soon as possible for a visit in 2 weeks.   Specialty:  Family Medicine   Contact information:   [id number] [place] [address] [phone]      discharge home  Diet: Low fiber Wt Readings from Last 3 Encounters:  07/24/13 67.8 kg (149 lb 7.6 oz)  04/16/13 68.947 kg (152 lb)  01/31/13 69.854 kg (154 lb)    History of present illness:  63 y.o. male with Past medical history of hypertension, seizure disorder, peripheral neuropathy, alcohol abuse.  The patient presents with complaints of right upper quadrant pain that has been ongoing on and off since last 4 months. The history has been obtained from both patient and his wife. As per the wife,  the patient usually drinks 24 ounces of whiskey in a day which he has cut back to 6 ounces of whiskey in a day since last 2-1/2 month.  As per the patient since last 4 months he has been having  on and off pain in the right upper quadrant which radiates to his right back and eventually settled down to his lower abdomen and pelvis this episode is associated with constipation and is followed by diarrhea without any blood. During his episode the patient also had episodes of vomiting and therefore stopped eating.  He mentions this episode have been lasting for 2 weeks roughly and reoccurring every 4 weeks.  Since this time it was his fourth or fifth episode therefore he went to see his PCP who referred him to the hospital for further evaluation.  Current episode has been ongoing since last 4 days with severe pain in his right upper quadrant associated with acid reflux nausea and vomiting. CT abdomen and pelvis at the time of admission was concerning for a duodenal ulceration with possible perforation. General surgery was consulted. The patient was made n.p.o. He was started on IV fluids and intravenous antibiotics. His CT also showed a discrete fluid collection about the right kidney as well as a complex fluid collection within the mesentery. The patient was placed on alcohol withdrawal protocol. He did not experience any withdrawal. His seizure medications were adjusted with his Keppra dose increased because he was not able to take Lamictal due to his n.p.o. status. The patient clinically improved with IV Zosyn. Upper GI study was performed after several days of being n.p.o. When his upper GI study was negative for any contrast extravasation, the patient was started on a clear liquid diet. His diet was gradually advanced and he tolerated it without any worsening abdominal pain. After discussion with general surgery regarding the possibility of CT-guided biopsy and drainage of his fluid collections, they recommended conservative treatment at this time with antibiotics. They recommended repeat CT in 2-3 months unless the patient begins to experience abdominal pain and fevers. As the patient was tolerating  his diet, his Lamictal was reintroduced. His Keppra dose was decreased back to his previous dose. His diet was advanced and he was able to tolerate without worsening abdominal pain or vomiting. His IV Zosyn was converted to oral Cipro and Flagyl which he will take for an additional 7 days to complete 14 days of therapy.     Consultants: General surgery  Discharge Exam: Filed Vitals:   07/24/13 0650  BP: 113/60  Pulse: 56  Temp: 97.8 F (36.6 C)  Resp: 18   Filed Vitals:   07/23/13 2150 07/24/13 0013 07/24/13 0500 07/24/13 0650  BP: 138/68 124/66  113/60  Pulse: 61 67  56  Temp: 99.5 F (37.5 C) 99.7 F (37.6 C)  97.8 F (36.6 C)  TempSrc: Oral Oral  Oral  Resp: 20 18  18   Height:      Weight:   67.8 kg (149 lb 7.6 oz)   SpO2: 96% 96%  91%   General: A&O x 3, NAD, pleasant, cooperative Cardiovascular: RRR, no rub, no gallop, no S3 Respiratory: CTAB, no wheeze, no rhonchi  Abdomen:soft, nontender, nondistended, positive bowel sounds Extremities: No edema, No lymphangitis, no petechiae.  States feet are tender, which is chronic  Discharge Instructions      Discharge Orders   Future Appointments Provider Department Dept Phone   10/21/2013 10:30 AM Dennie Bible, NP [facility] [phone]   Future Orders Complete By Expires   Discharge instructions  As directed    Comments:     Lamictal (lamotrigine)--Take 25 mg (1 tablet) once daily x 2 weeks, then 50mg  (2 tablets) once daily x 2 weeks, then 50mg  (2 tablets) two times a day x 2 weeks. Keppra--750mg  two times a day until you have titrated back to your previous lamictal dose.  Restart Keppra 500mg  when you have titrated back to your original Lamictal dose   Increase activity slowly  As directed        Medication List    STOP taking these medications       doxycycline 100 MG tablet  Commonly known as:  VIBRA-TABS      TAKE these medications       ciprofloxacin 500 MG tablet  Commonly  known as:  CIPRO  Take 1 tablet (500 mg total) by mouth 2 (two) times daily.     cloNIDine 0.2 mg/24hr patch  Commonly known as:  CATAPRES - Dosed in mg/24 hr  Place 1 patch onto the skin once a week.     lamoTRIgine 25 MG tablet  Commonly known as:  LAMICTAL  Take 1 tablet (25 mg total) by mouth daily. Take 25 mg (1 tablet) once daily x 2 weeks, then 50mg  (2 tablets) once daily x 2 weeks, then 50mg  (2 tablets) two times a day x 2 weeks.     levETIRAcetam 750 MG tablet  Commonly known as:  KEPPRA  Take 1 tablet (750 mg total) by mouth 2 (two) times daily. Take 750mg  dose until you have titrated back to your original lamotrigine dose     levETIRAcetam 500 MG tablet  Commonly known as:  KEPPRA  Take 1 tablet (500 mg total) by mouth 2 (two) times daily. Restart this dose only AFTER you have titrated back to your previous lamotrigine dose     metroNIDAZOLE 500 MG tablet  Commonly known as:  FLAGYL  Take 1 tablet (500 mg total) by mouth every 8 (eight) hours.     pantoprazole 40 MG tablet  Commonly known as:  PROTONIX  Take 1 tablet (40 mg total) by mouth 2 (two) times daily.         The results of significant diagnostics from this hospitalization (including imaging, microbiology, ancillary and laboratory) are listed below for reference.    Significant Diagnostic Studies: Ct Abdomen Pelvis W Contrast  07/18/2013   ADDENDUM REPORT: 07/18/2013 08:47  ADDENDUM: Lab work today demonstrates a lipase of 103. The above findings may simply represent advanced acute pancreatitis with multiple phlegmon collections. The cystic mass in the head of the pancreas should be followed with the repeat CT in 3 months.   Electronically Signed   By: Maryclare Bean M.D.   On: 07/18/2013 08:47   07/18/2013   CLINICAL DATA:  Lower abdominal pain  EXAM: CT ABDOMEN AND PELVIS WITH CONTRAST  TECHNIQUE: Multidetector CT imaging of the abdomen and pelvis was performed using the standard protocol following bolus  administration of intravenous contrast.  CONTRAST:  116mL OMNIPAQUE IOHEXOL 300 MG/ML  SOLN  COMPARISON:  05/29/2007  FINDINGS: Spinal cord stimulator is in place extending from  the upper lumbar spine at canal into the thoracic posterior spinal canal.  Nonspecific hypodensities are present within the liver. The liver is somewhat cirrhotic. The largest is towards the dome measuring 1.0 cm. They are most likely cysts.  Small gallstone is present. No obvious inflammatory changes of the gallbladder.  There is a significant inflammatory process occurring within the mesenteric. The inflammatory process relatively spares the pancreas. There is some stranding towards the tail of the pancreas. The inflammatory process is most intimately associated with the 3rd and 4th portions of the duodenum. This is associated with wall thickening. There is also a possible defect within the wall of the duodenum. See image 54 of series 4 on the coronal reconstructions.  There is a complex cystic mass within the uncinate process of the pancreas measuring 2.8 x 2.7 cm. Fluid density is seen extending from the duodenum and uncinate process into the mesenteric.  There is a complex fluid and soft tissue mass within the mesentery measuring 3.6 x 6.8 cm on image 44. This may represent phlegmon of or necrotic adenopathy. Similar densities with some fat density are seen in the posterior right perinephric space. There is a discoid fluid collection superior to the right kidney in the retroperitoneum measuring 2.8 x 5.5 cm.  Of note, there is no definitive free intraperitoneal gas.  Adrenal glands are within normal limits. Simple cysts in both kidneys.  Sigmoid diverticulosis without definite acute diverticulitis. Bladder is within normal limits.  Degenerative and postoperative changes in the lumbar spine.  IMPRESSION: There is a prominent inflammatory process within the mesenteries of the abdomen as described above. The most critical finding is  inflammatory changes intimately associated with the distal duodenum an a possible defect within the duodenum. There is no definite extraperitoneal or intraperitoneal gas however perforation cannot be excluded. There is relative sparing of the inflammatory process to the pancreas.  Other intra-abdominal fluid collections as described.  Complex mass within the mesentery which may represent adenopathy or an inflammatory phlegmon. Similar densities posterior to the right kidney.  Cholelithiasis.  Nonspecific hypodensities in the liver. Because the liver is somewhat cirrhotic. The patient is at risk for liver malignancy. Follow-up outpatient MRI is recommended.  Electronically Signed: By: Maryclare Bean M.D. On: 07/17/2013 20:22   US Venous Img Lower Unilateral Right  07/01/2013   CLINICAL DATA:  Left leg pain  EXAM: Left LOWER EXTREMITY VENOUS DOPPLER ULTRASOUND  TECHNIQUE: Gray-scale sonography with graded compression, as well as color Doppler and duplex ultrasound, were performed to evaluate the deep venous system from the level of the common femoral vein through the popliteal and proximal calf veins. Spectral Doppler was utilized to evaluate flow at rest and with distal augmentation maneuvers.  COMPARISON:  None.  FINDINGS: Thrombus within deep veins:  None visualized.  Compressibility of deep veins:  Normal.  Duplex waveform respiratory phasicity:  Normal.  Duplex waveform response to augmentation:  Normal.  Venous reflux:  None visualized.  Other findings:  None visualized.  IMPRESSION: No acute abnormality is noted.  No deep venous thrombosis is seen.   Electronically Signed   By: Alcide Clever M.D.   On: 07/01/2013 16:24   Dg Abd Acute W/chest  07/17/2013   CLINICAL DATA:  Constipation, nausea, vomiting, rectal bleeding, history hypertension  EXAM: ACUTE ABDOMEN SERIES (ABDOMEN 2 VIEW & CHEST 1 VIEW)  COMPARISON:  Chest radiographs 08/13/2010  FINDINGS: Rotated on chest radiograph to the left.  Normal heart size,  mediastinal contours, and pulmonary  vascularity.  Lungs appear emphysematous but clear.  No pleural effusion or pneumothorax.  Prior cervical spine fusion.  Intraspinal stimulator leads noted.  Bones diffusely demineralized.  Normal bowel gas pattern.  No bowel dilatation or bowel wall thickening or free intraperitoneal air.  Scattered atherosclerotic calcifications.  No definite urinary tract calcification.  IMPRESSION: Question COPD.  No acute abdominal findings.   Electronically Signed   By: Lavonia Dana M.D.   On: 07/17/2013 20:00   Dg Ugi W/water Sol Cm  07/21/2013   CLINICAL DATA:  Possible perforated duodenal ulcer, evaluate for leak  EXAM: WATER SOLUBLE UPPER GI SERIES  TECHNIQUE: Single-column upper GI series was performed using water soluble contrast.  CONTRAST:  , 155mL OMNIPAQUE IOHEXOL 300 MG/ML  SOLN  COMPARISON:  CT scan 07/17/2013.  FLUOROSCOPY TIME:  4 min  FINDINGS: The visualized esophagus shows normal contour and distensibility. Normal motility.  The stomach shows normal contour and distensibility. Mild decreased gastric motility.  Duodenal bulb and duodenal sweep is unremarkable. No evidence of gastric outlet obstruction. Contrast is noted advancing in duodenum and proximal jejunum. There is no evidence of contrast extravasation. Static image post fluoroscopy demonstrate persistent residual contrast material within stomach.  IMPRESSION: 1. Unremarkable visualized esophagus. No evidence of gastric outlet obstruction. Mild decrease gastric motility with some delay in gastric emptying. No evidence of gastric outlet obstruction. Unremarkable duodenal bulb and duodenal sweep. No evidence of contrast extravasation. Visualized proximal small bowel is unremarkable.   Electronically Signed   By: Lahoma Crocker M.D.   On: 07/21/2013 12:32     Microbiology: Recent Results (from the past 240 hour(s))  MRSA PCR SCREENING     Status: None   Collection Time    07/18/13  2:22 AM      Result Value Range  Status   MRSA by PCR NEGATIVE  NEGATIVE Final   Comment:            The GeneXpert MRSA Assay (FDA     approved for NASAL specimens     only), is one component of a     comprehensive MRSA colonization     surveillance program. It is not     intended to diagnose MRSA     infection nor to guide or     monitor treatment for     MRSA infections.     Labs: Basic Metabolic Panel:  Recent Labs Lab 07/20/13 0332 07/21/13 0344 07/22/13 0520 07/23/13 0432 07/24/13 0432  NA 138 137 136* 138 135*  K 2.9* 3.2* 3.3* 3.6* 3.6*  CL 102 101 103 105 101  CO2 25 24 22 23 23   GLUCOSE 85 104* 108* 109* 95  BUN <3* <3* <3* <3* <3*  CREATININE 0.69 0.63 0.73 0.82 0.81  CALCIUM 7.9* 7.8* 7.6* 7.6* 7.5*  MG  --   --   --  1.4* 1.9   Liver Function Tests:  Recent Labs Lab 07/17/13 1920 07/18/13 0340 07/19/13 0354  AST 13 11 12   ALT 6 <5 <5  ALKPHOS 81 59 58  BILITOT 0.9 0.7 0.6  PROT 6.3 4.4* 4.5*  ALBUMIN 2.9* 2.0* 1.9*    Recent Labs Lab 07/17/13 1920  LIPASE 103*   No results found for this basename: AMMONIA,  in the last 168 hours CBC:  Recent Labs Lab 07/17/13 1920  07/19/13 0354 07/20/13 0332 07/21/13 0344 07/22/13 0520 07/23/13 0432  WBC 22.0*  < > 11.3* 9.7 11.3* 8.2 6.7  NEUTROABS 18.3*  --  8.3*  --   --   --   --   HGB 15.7  < > 11.6* 12.8* 13.2 11.9* 11.7*  HCT 44.5  < > 33.6* 37.7* 37.3* 34.5* 33.2*  MCV 108.3*  < > 107.7* 107.4* 103.6* 104.9* 105.1*  PLT 271  < > 204 234 265 240 266  < > = values in this interval not displayed. Cardiac Enzymes: No results found for this basename: CKTOTAL, CKMB, CKMBINDEX, TROPONINI,  in the last 168 hours BNP: No components found with this basename: POCBNP,  CBG:  Recent Labs Lab 07/23/13 0554 07/23/13 1157 07/23/13 1715 07/24/13 0008 07/24/13 0648  GLUCAP 109* 88 107* 103* 105*    Time coordinating discharge:  45 mins  Signed:  Janece Canterbury, MD [facility] Pager: [phone] 07/24/2013, 9:28  AM

## 2013-07-24 NOTE — Progress Notes (Signed)
CSW spoke with patient & wife at bedside re: ETOH abuse & possible treatment options. Patient declined SBIRT assessment. CSW provided patient with AA meeting list & Substance Abuse Treatment List for outpatient & residential. Patient states that he's been to [facility] and might would be open to it again, but not right away. He states that he's waiting for his Medicare to become effective.   CSW instructed patient that if he had any further questions to call or let his nurse know to get in touch with me. Patient plans to discharge home today.   Jeffrey Castillo, [facility] Clinical Social Worker cell #: [phone]

## 2013-07-24 NOTE — Progress Notes (Signed)
Nutrition Education Note  RD consulted for nutrition education regarding low fiber diet  RD provided "Low Fiber Nutrition Therapy" handout from the Academy of [employer]. Reviewed patient's dietary recall. Provided examples on ways to decrease fiber intake in diet. Discouraged intake of whole grains, fresh fruits and vegetables and gas causing foods. Encouraged low fiber foods, and well cooked vegetables/fruits for a two week period with a gradual incorporation of fiber into diet   RD discussed why it is important for patient to adhere to diet recommendations, and emphasized the role of ffoods to avoid. Teach back method used.  Expect good compliance. Pt receptive and was able to recall information he had been instructed on diet previously from MD. Diet recall indicated pt normally consumes large amounts of fiber foods, (whole grain breads/fresh vegetables) but was willing to comply with diet recommendations. Planned for d/c today, pt seemed confident in ability to follow diet.  Body mass index is 23.41 kg/(m^2). Pt meets criteria for Normal weight based on current BMI.  Current diet order is Low fiber, patient is consuming approximately 75% of meals at this time. Labs and medications reviewed. No further nutrition interventions warranted at this time. RD contact information provided. If additional nutrition issues arise, please re-consult RD.   Atlee Abide MS RD LDN Clinical Dietitian [phone]

## 2013-07-24 NOTE — Progress Notes (Signed)
  Subjective: He feels fine and wants more food.  He can't get up without someone helping him (safety rules) so he's not up allot.    Objective: Vital signs in last 24 hours: Temp:  [97.8 F (36.6 C)-99.7 F (37.6 C)] 97.8 F (36.6 C) (01/14 0650) Pulse Rate:  [55-67] 56 (01/14 0650) Resp:  [18-20] 18 (01/14 0650) BP: (113-138)/(60-69) 113/60 mmHg (01/14 0650) SpO2:  [91 %-96 %] 91 % (01/14 0650) Weight:  [67.8 kg (149 lb 7.6 oz)] 67.8 kg (149 lb 7.6 oz) (01/14 0500) Last BM Date: 07/22/13 1400 PO recorded.   + BM Diet: full liquids  Labs OK Intake/Output from previous day: 01/13 0701 - 01/14 0700 In: 2600 [P.O.:1400; I.V.:1100; IV Piggyback:100] Out: 2751 [Urine:2750; Stool:1] Intake/Output this shift:    General appearance: alert, cooperative and no distress GI: soft, non-tender; bowel sounds normal; no masses,  no organomegaly  Lab Results:   Recent Labs  07/22/13 0520 07/23/13 0432  WBC 8.2 6.7  HGB 11.9* 11.7*  HCT 34.5* 33.2*  PLT 240 266    BMET  Recent Labs  07/23/13 0432 07/24/13 0432  NA 138 135*  K 3.6* 3.6*  CL 105 101  CO2 23 23  GLUCOSE 109* 95  BUN <3* <3*  CREATININE 0.82 0.81  CALCIUM 7.6* 7.5*   PT/INR No results found for this basename: LABPROT, INR,  in the last 72 hours   Recent Labs Lab 07/17/13 1920 07/18/13 0340 07/19/13 0354  AST 13 11 12   ALT 6 <5 <5  ALKPHOS 81 59 58  BILITOT 0.9 0.7 0.6  PROT 6.3 4.4* 4.5*  ALBUMIN 2.9* 2.0* 1.9*     Lipase     Component Value Date/Time   LIPASE 103* 07/17/2013 1920     Studies/Results: No results found.  Medications: . ciprofloxacin  500 mg Oral BID  . cloNIDine  0.2 mg Transdermal Weekly  . heparin  5,000 Units Subcutaneous Q8H  . lamoTRIgine  25 mg Oral Daily  . levETIRAcetam  750 mg Oral BID  . metroNIDAZOLE  500 mg Oral Q8H  . nicotine  14 mg Transdermal Daily  . pantoprazole  40 mg Oral BID  . sodium chloride  3 mL Intravenous Q12H    Assessment/Plan 1.  Constipation, nausea and vomiting  2. Impending duodenal perforation (inflammatory process 3rd and 4th Portions of the duodenum, possible defect wall of duodenum, fluid/soft tissue complex mesentery-phlegmon vs necrotic tissue 3.6 x 6.8 cm.  UGI: Unremarkable visualized esophagus. No evidence of gastric outlet obstruction. Mild decrease gastric motility with some delay in gastric emptying. No evidence of gastric outlet obstruction. Unremarkable duodenal bulb and duodenal sweep. No evidence of contrast extravasation. Visualized proximal small bowel is unremarkable.  3. Heavy alcohol use  4. Tobacco use/COPD  5. Hypertension  6. Hx of neuropathy/gait disorder  7. Memory disorder  8. Hx of seizures/essential tremors  9. Hx of lumbar/cervical surgery with spina cord stimulator implant 2012    Plan:  Low fiber diet, Suspected Duodenal ulcer perforation-clinically improving. Cystic lesion of pancreas-needs f/u CT in 3 months.  He does not need to see surgeon unless he has a change in his progress. I have placed Dr.Thomas's information in AVS and he can follow up with her if he needs a surgical evaluation again.      LOS: 7 days    Keyandra Swenson 07/24/2013

## 2013-07-24 NOTE — Progress Notes (Signed)
Patient seen and examined.  Agree with PA's note. 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 He will need to go home on bid Protonix and have an EGD done in 6-8 weeks by GI.

## 2013-08-01 ENCOUNTER — Emergency Department (HOSPITAL_COMMUNITY): Payer: Medicare Other

## 2013-08-01 ENCOUNTER — Inpatient Hospital Stay (HOSPITAL_COMMUNITY): Payer: Medicare Other

## 2013-08-01 ENCOUNTER — Inpatient Hospital Stay (HOSPITAL_COMMUNITY)
Admission: EM | Admit: 2013-08-01 | Discharge: 2013-08-07 | DRG: 371 | Disposition: A | Discharge status: Home / Self Care | Payer: Medicare Other | Attending: Internal Medicine | Admitting: Internal Medicine

## 2013-08-01 ENCOUNTER — Encounter (HOSPITAL_COMMUNITY): Payer: Self-pay | Admitting: Emergency Medicine

## 2013-08-01 DIAGNOSIS — J449 Chronic obstructive pulmonary disease, unspecified: Secondary | ICD-10-CM | POA: Diagnosis present

## 2013-08-01 DIAGNOSIS — R269 Unspecified abnormalities of gait and mobility: Secondary | ICD-10-CM

## 2013-08-01 DIAGNOSIS — M545 Low back pain, unspecified: Secondary | ICD-10-CM | POA: Diagnosis present

## 2013-08-01 DIAGNOSIS — M1990 Unspecified osteoarthritis, unspecified site: Secondary | ICD-10-CM | POA: Diagnosis present

## 2013-08-01 DIAGNOSIS — K862 Cyst of pancreas: Secondary | ICD-10-CM | POA: Diagnosis present

## 2013-08-01 DIAGNOSIS — R109 Unspecified abdominal pain: Secondary | ICD-10-CM

## 2013-08-01 DIAGNOSIS — G250 Essential tremor: Secondary | ICD-10-CM | POA: Diagnosis present

## 2013-08-01 DIAGNOSIS — K863 Pseudocyst of pancreas: Secondary | ICD-10-CM | POA: Diagnosis present

## 2013-08-01 DIAGNOSIS — K859 Acute pancreatitis without necrosis or infection, unspecified: Secondary | ICD-10-CM | POA: Diagnosis present

## 2013-08-01 DIAGNOSIS — G589 Mononeuropathy, unspecified: Secondary | ICD-10-CM | POA: Diagnosis present

## 2013-08-01 DIAGNOSIS — J90 Pleural effusion, not elsewhere classified: Secondary | ICD-10-CM | POA: Diagnosis present

## 2013-08-01 DIAGNOSIS — K449 Diaphragmatic hernia without obstruction or gangrene: Secondary | ICD-10-CM | POA: Diagnosis present

## 2013-08-01 DIAGNOSIS — K279 Peptic ulcer, site unspecified, unspecified as acute or chronic, without hemorrhage or perforation: Secondary | ICD-10-CM

## 2013-08-01 DIAGNOSIS — J4489 Other specified chronic obstructive pulmonary disease: Secondary | ICD-10-CM | POA: Diagnosis present

## 2013-08-01 DIAGNOSIS — F17200 Nicotine dependence, unspecified, uncomplicated: Secondary | ICD-10-CM | POA: Diagnosis present

## 2013-08-01 DIAGNOSIS — M47817 Spondylosis without myelopathy or radiculopathy, lumbosacral region: Secondary | ICD-10-CM | POA: Diagnosis present

## 2013-08-01 DIAGNOSIS — G40309 Generalized idiopathic epilepsy and epileptic syndromes, not intractable, without status epilepticus: Secondary | ICD-10-CM | POA: Diagnosis present

## 2013-08-01 DIAGNOSIS — K651 Peritoneal abscess: Secondary | ICD-10-CM

## 2013-08-01 DIAGNOSIS — R5381 Other malaise: Secondary | ICD-10-CM | POA: Diagnosis present

## 2013-08-01 DIAGNOSIS — D1330 Benign neoplasm of unspecified part of small intestine: Secondary | ICD-10-CM | POA: Diagnosis present

## 2013-08-01 DIAGNOSIS — E46 Unspecified protein-calorie malnutrition: Secondary | ICD-10-CM | POA: Diagnosis present

## 2013-08-01 DIAGNOSIS — G8929 Other chronic pain: Secondary | ICD-10-CM | POA: Diagnosis present

## 2013-08-01 DIAGNOSIS — M899 Disorder of bone, unspecified: Secondary | ICD-10-CM | POA: Diagnosis present

## 2013-08-01 DIAGNOSIS — Z888 Allergy status to other drugs, medicaments and biological substances status: Secondary | ICD-10-CM

## 2013-08-01 DIAGNOSIS — R300 Dysuria: Secondary | ICD-10-CM | POA: Diagnosis present

## 2013-08-01 DIAGNOSIS — M79672 Pain in left foot: Secondary | ICD-10-CM

## 2013-08-01 DIAGNOSIS — K6819 Other retroperitoneal abscess: Principal | ICD-10-CM | POA: Diagnosis present

## 2013-08-01 DIAGNOSIS — K869 Disease of pancreas, unspecified: Secondary | ICD-10-CM | POA: Diagnosis present

## 2013-08-01 DIAGNOSIS — I10 Essential (primary) hypertension: Secondary | ICD-10-CM | POA: Diagnosis present

## 2013-08-01 DIAGNOSIS — G252 Other specified forms of tremor: Secondary | ICD-10-CM

## 2013-08-01 DIAGNOSIS — R1031 Right lower quadrant pain: Secondary | ICD-10-CM | POA: Diagnosis not present

## 2013-08-01 DIAGNOSIS — Z791 Long term (current) use of non-steroidal anti-inflammatories (NSAID): Secondary | ICD-10-CM

## 2013-08-01 DIAGNOSIS — M949 Disorder of cartilage, unspecified: Secondary | ICD-10-CM | POA: Diagnosis present

## 2013-08-01 DIAGNOSIS — E876 Hypokalemia: Secondary | ICD-10-CM | POA: Diagnosis not present

## 2013-08-01 DIAGNOSIS — K7689 Other specified diseases of liver: Secondary | ICD-10-CM | POA: Diagnosis present

## 2013-08-01 DIAGNOSIS — K265 Chronic or unspecified duodenal ulcer with perforation: Secondary | ICD-10-CM | POA: Diagnosis present

## 2013-08-01 DIAGNOSIS — Z79899 Other long term (current) drug therapy: Secondary | ICD-10-CM

## 2013-08-01 DIAGNOSIS — F1010 Alcohol abuse, uncomplicated: Secondary | ICD-10-CM

## 2013-08-01 HISTORY — DX: Chronic obstructive pulmonary disease, unspecified: J44.9

## 2013-08-01 LAB — CBC WITH DIFFERENTIAL/PLATELET
Basophils Absolute: 0 10*3/uL (ref 0.0–0.1)
Basophils Relative: 0 % (ref 0–1)
Eosinophils Absolute: 0.2 10*3/uL (ref 0.0–0.7)
Eosinophils Relative: 2 % (ref 0–5)
HCT: 42.2 % (ref 39.0–52.0)
Hemoglobin: 14.4 g/dL (ref 13.0–17.0)
Lymphocytes Relative: 23 % (ref 12–46)
Lymphs Abs: 2.8 10*3/uL (ref 0.7–4.0)
MCH: 35.8 pg — ABNORMAL HIGH (ref 26.0–34.0)
MCHC: 34.1 g/dL (ref 30.0–36.0)
MCV: 105 fL — ABNORMAL HIGH (ref 78.0–100.0)
Monocytes Absolute: 1.1 10*3/uL — ABNORMAL HIGH (ref 0.1–1.0)
Monocytes Relative: 9 % (ref 3–12)
Neutro Abs: 7.9 10*3/uL — ABNORMAL HIGH (ref 1.7–7.7)
Neutrophils Relative %: 66 % (ref 43–77)
Platelets: 312 10*3/uL (ref 150–400)
RBC: 4.02 MIL/uL — ABNORMAL LOW (ref 4.22–5.81)
RDW: 14.1 % (ref 11.5–15.5)
WBC: 12 10*3/uL — ABNORMAL HIGH (ref 4.0–10.5)

## 2013-08-01 LAB — URINALYSIS, ROUTINE W REFLEX MICROSCOPIC
Bilirubin Urine: NEGATIVE
Glucose, UA: NEGATIVE mg/dL
Hgb urine dipstick: NEGATIVE
Ketones, ur: NEGATIVE mg/dL
Nitrite: POSITIVE — AB
Protein, ur: NEGATIVE mg/dL
Specific Gravity, Urine: 1.025 (ref 1.005–1.030)
Urobilinogen, UA: 0.2 mg/dL (ref 0.0–1.0)
pH: 6 (ref 5.0–8.0)

## 2013-08-01 LAB — PROTIME-INR
INR: 1.37 (ref 0.00–1.49)
Prothrombin Time: 16.5 seconds — ABNORMAL HIGH (ref 11.6–15.2)

## 2013-08-01 LAB — COMPREHENSIVE METABOLIC PANEL
ALT: 7 U/L (ref 0–53)
AST: 16 U/L (ref 0–37)
Albumin: 2.7 g/dL — ABNORMAL LOW (ref 3.5–5.2)
Alkaline Phosphatase: 133 U/L — ABNORMAL HIGH (ref 39–117)
BUN: 7 mg/dL (ref 6–23)
CO2: 26 mEq/L (ref 19–32)
Calcium: 8.8 mg/dL (ref 8.4–10.5)
Chloride: 104 mEq/L (ref 96–112)
Creatinine, Ser: 0.72 mg/dL (ref 0.50–1.35)
GFR calc Af Amer: >90 mL/min (ref >90)
GFR calc non Af Amer: >90 mL/min (ref >90)
Glucose, Bld: 111 mg/dL — ABNORMAL HIGH (ref 70–99)
Potassium: 3.9 mEq/L (ref 3.7–5.3)
Sodium: 142 mEq/L (ref 137–147)
Total Bilirubin: 0.5 mg/dL (ref 0.3–1.2)
Total Protein: 6.2 g/dL (ref 6.0–8.3)

## 2013-08-01 LAB — APTT: aPTT: 40 seconds — ABNORMAL HIGH (ref 24–37)

## 2013-08-01 LAB — LIPASE, BLOOD: Lipase: 14 U/L (ref 11–59)

## 2013-08-01 LAB — URINE MICROSCOPIC-ADD ON

## 2013-08-01 LAB — CG4 I-STAT (LACTIC ACID): Lactic Acid, Venous: 1.6 mmol/L (ref 0.5–2.2)

## 2013-08-01 MED ORDER — ONDANSETRON HCL 4 MG PO TABS: 4.0000 mg | ORAL_TABLET | Freq: Four times a day (QID) | ORAL | Status: DC | PRN

## 2013-08-01 MED ORDER — MORPHINE SULFATE 4 MG/ML IJ SOLN
4.0000 mg | Freq: Once | INTRAMUSCULAR | Status: AC
  Administered 2013-08-01: 4 mg via INTRAVENOUS
  Filled 2013-08-01: qty 1

## 2013-08-01 MED ORDER — CEPHALEXIN 500 MG PO CAPS
500.0000 mg | ORAL_CAPSULE | Freq: Once | ORAL | Status: AC
  Administered 2013-08-01: 500 mg via ORAL
  Filled 2013-08-01: qty 1

## 2013-08-01 MED ORDER — SODIUM CHLORIDE 0.9 % IV SOLN
1.0000 g | INTRAVENOUS | Status: DC
  Administered 2013-08-01 – 2013-08-06 (×6): 1 g via INTRAVENOUS
  Filled 2013-08-01 (×7): qty 1

## 2013-08-01 MED ORDER — IOHEXOL 300 MG/ML  SOLN
50.0000 mL | Freq: Once | INTRAMUSCULAR | Status: AC | PRN
  Administered 2013-08-01: 50 mL via ORAL

## 2013-08-01 MED ORDER — CLONIDINE HCL 0.2 MG/24HR TD PTWK
0.2000 mg | MEDICATED_PATCH | TRANSDERMAL | Status: DC
Start: 2013-08-03 — End: 2013-08-07
  Administered 2013-08-03: 0.2 mg via TRANSDERMAL
  Filled 2013-08-01: qty 1

## 2013-08-01 MED ORDER — MORPHINE SULFATE 4 MG/ML IJ SOLN
4.0000 mg | INTRAMUSCULAR | Status: DC | PRN
  Administered 2013-08-01 – 2013-08-04 (×7): 4 mg via INTRAVENOUS
  Filled 2013-08-01 (×8): qty 1

## 2013-08-01 MED ORDER — THIAMINE HCL 100 MG/ML IJ SOLN
100.0000 mg | Freq: Every day | INTRAMUSCULAR | Status: DC
Start: 2013-08-01 — End: 2013-08-06
  Administered 2013-08-01 – 2013-08-06 (×6): 100 mg via INTRAVENOUS
  Filled 2013-08-01 (×6): qty 1

## 2013-08-01 MED ORDER — ONDANSETRON HCL 4 MG/2ML IJ SOLN
4.0000 mg | INTRAMUSCULAR | Status: AC
  Administered 2013-08-01: 4 mg via INTRAVENOUS
  Filled 2013-08-01: qty 2

## 2013-08-01 MED ORDER — MIDAZOLAM HCL 2 MG/2ML IJ SOLN
INTRAMUSCULAR | Status: AC
  Filled 2013-08-01: qty 2

## 2013-08-01 MED ORDER — PANTOPRAZOLE SODIUM 40 MG IV SOLR: 40.0000 mg | Freq: Every day | INTRAVENOUS | Status: DC

## 2013-08-01 MED ORDER — FENTANYL CITRATE 0.05 MG/ML IJ SOLN
INTRAMUSCULAR | Status: AC
  Filled 2013-08-01: qty 6

## 2013-08-01 MED ORDER — METRONIDAZOLE IN NACL 5-0.79 MG/ML-% IV SOLN: 500.0000 mg | Freq: Three times a day (TID) | INTRAVENOUS | Status: DC

## 2013-08-01 MED ORDER — HYDROCODONE-ACETAMINOPHEN 5-325 MG PO TABS
1.0000 | ORAL_TABLET | ORAL | Status: DC | PRN
  Administered 2013-08-02: 2 via ORAL
  Administered 2013-08-07: 1 via ORAL
  Filled 2013-08-01 (×3): qty 2

## 2013-08-01 MED ORDER — CIPROFLOXACIN IN D5W 400 MG/200ML IV SOLN: 400.0000 mg | Freq: Two times a day (BID) | INTRAVENOUS | Status: DC

## 2013-08-01 MED ORDER — DOCUSATE SODIUM 100 MG PO CAPS
100.0000 mg | ORAL_CAPSULE | Freq: Two times a day (BID) | ORAL | Status: DC
  Administered 2013-08-01 – 2013-08-07 (×11): 100 mg via ORAL
  Filled 2013-08-01 (×15): qty 1

## 2013-08-01 MED ORDER — ONDANSETRON HCL 4 MG/2ML IJ SOLN: 4.0000 mg | Freq: Four times a day (QID) | INTRAMUSCULAR | Status: DC | PRN

## 2013-08-01 MED ORDER — ALBUTEROL SULFATE (2.5 MG/3ML) 0.083% IN NEBU: 2.5000 mg | INHALATION_SOLUTION | RESPIRATORY_TRACT | Status: DC | PRN

## 2013-08-01 MED ORDER — SODIUM CHLORIDE 0.9 % IV SOLN
750.0000 mg | Freq: Two times a day (BID) | INTRAVENOUS | Status: DC
  Administered 2013-08-01 – 2013-08-04 (×8): 750 mg via INTRAVENOUS
  Filled 2013-08-01 (×10): qty 7.5

## 2013-08-01 MED ORDER — SODIUM CHLORIDE 0.9 % IV BOLUS (SEPSIS)
1000.0000 mL | Freq: Once | INTRAVENOUS | Status: AC
  Administered 2013-08-01: 1000 mL via INTRAVENOUS

## 2013-08-01 MED ORDER — FOLIC ACID 5 MG/ML IJ SOLN
1.0000 mg | Freq: Every day | INTRAMUSCULAR | Status: DC
  Administered 2013-08-01 – 2013-08-06 (×6): 1 mg via INTRAVENOUS
  Filled 2013-08-01 (×9): qty 0.2

## 2013-08-01 MED ORDER — IOHEXOL 300 MG/ML  SOLN
100.0000 mL | Freq: Once | INTRAMUSCULAR | Status: AC | PRN
  Administered 2013-08-01: 100 mL via INTRAVENOUS

## 2013-08-01 MED ORDER — ACETAMINOPHEN 650 MG RE SUPP: 650.0000 mg | Freq: Four times a day (QID) | RECTAL | Status: DC | PRN

## 2013-08-01 MED ORDER — FENTANYL CITRATE 0.05 MG/ML IJ SOLN
INTRAMUSCULAR | Status: AC | PRN
  Administered 2013-08-01: 25 ug via INTRAVENOUS
  Administered 2013-08-01: 50 ug via INTRAVENOUS
  Administered 2013-08-01: 25 ug via INTRAVENOUS

## 2013-08-01 MED ORDER — MIDAZOLAM HCL 2 MG/2ML IJ SOLN
INTRAMUSCULAR | Status: AC | PRN
  Administered 2013-08-01: 1 mg via INTRAVENOUS
  Administered 2013-08-01 (×2): 0.5 mg via INTRAVENOUS

## 2013-08-01 MED ORDER — TIOTROPIUM BROMIDE MONOHYDRATE 18 MCG IN CAPS
18.0000 ug | ORAL_CAPSULE | Freq: Every day | RESPIRATORY_TRACT | Status: DC
  Administered 2013-08-02 – 2013-08-07 (×5): 18 ug via RESPIRATORY_TRACT
  Filled 2013-08-01 (×2): qty 5

## 2013-08-01 MED ORDER — PANTOPRAZOLE SODIUM 40 MG IV SOLR
40.0000 mg | Freq: Two times a day (BID) | INTRAVENOUS | Status: DC
  Administered 2013-08-01 – 2013-08-04 (×8): 40 mg via INTRAVENOUS
  Filled 2013-08-01 (×10): qty 40

## 2013-08-01 MED ORDER — SODIUM CHLORIDE 0.9 % IJ SOLN
3.0000 mL | Freq: Two times a day (BID) | INTRAMUSCULAR | Status: DC
  Administered 2013-08-01 – 2013-08-07 (×12): 3 mL via INTRAVENOUS

## 2013-08-01 MED ORDER — SODIUM CHLORIDE 0.9 % IV SOLN
INTRAVENOUS | Status: AC
  Administered 2013-08-01: 11:00:00 via INTRAVENOUS

## 2013-08-01 MED ORDER — MIDAZOLAM HCL 2 MG/2ML IJ SOLN
INTRAMUSCULAR | Status: AC
  Filled 2013-08-01: qty 4

## 2013-08-01 MED ORDER — ACETAMINOPHEN 325 MG PO TABS: 650.0000 mg | ORAL_TABLET | Freq: Four times a day (QID) | ORAL | Status: DC | PRN

## 2013-08-01 NOTE — Consult Note (Signed)
General surgery attending note:  I personally interviewed and examined this patient this morning. I reviewed his current CT scan and discuss his prior hospitalization. I agree with the assessment treatment plan outlined by Mr. Creig Hines, [place].  He has a retroperitoneal fluid collection on the right side which correlates with his right flank and pain and costovertebral angle tenderness. He also has some scattered collection the left upper quadrant which are not accessible for percutaneous drainage. And prior to his hospitalization yesterday he has been eating without any obstructive symptoms.  On exam he has tenderness at the right costovertebral angle posteriorly, right abdomen, and left upper quadrant, but less in the left upper quadrant. He is not significantly distended and there are no peritoneal signs.  Recommendations. N.p.o. Broad-spectrum antibiotics. Invanz started Double dose proton pump inhibitors Percutaneous drainage of retroperitoneal fluid collection today Gastroenterology consult. He will need a GI workup at some point because of the clinical diagnosis of contained perforated peptic ulcer.   Jeffrey Castillo. Dalbert Batman, M.D., [facility] General and Minimally invasive Surgery Breast and Colorectal Surgery Office:   [phone] Pager:   [phone]

## 2013-08-01 NOTE — Procedures (Signed)
Interventional Radiology Procedure Note  Procedure: Placement of a 34F drain into the right retroperitoneal fluid collection.  60 mL purulent fluid aspirated and a sample sent for cx. Complications: None Recommendations: - Maintain bag to JP bulb suction - Follow cx  Signed,  Criselda Peaches, MD [facility]iology

## 2013-08-01 NOTE — ED Notes (Signed)
Pt states that he was in the hospital within the last 2 weeks for suspected perforated stomach ulcer; pt states that he has been home for 8 days and this afternoon began to have pain again; pt states that the pain began in his back and radiated through to his lower right abdomen; pt denies nausea or vomiting

## 2013-08-01 NOTE — H&P (Signed)
PCP:  BABAOFF, [name], MD  Was with Jeffrey Castillo now works from [facility], Now patient will see Dr. Lysle Morales   Chief Complaint:  Abdominal pain  HPI: Jeffrey Castillo is a 63 y.o. male   has a past medical history of HYPERTENSION (07/31/2009); Seizures; Degenerative joint disease of low back; Gait disorder; Neuropathy; Complex regional pain syndrome of left lower extremity; Osteopenia; Memory disorder; Right humeral fracture; Essential tremor; and Alcohol abuse.   Presented with  patietn was admitted to [facility] on 07/17/13 with duodenal perforation he was treated with antibiotics and bowel rest with some improvemtn patietn was discharged on 07/24/13 to home on Cipro and FlagyL PO. While in the hospital he was followed by General surgery. He was doing well until yesterday when he developed right lower quadrant pain. Denis any fevr, chills, nausea or vomiting last PO intake 1/21 afternoon.  Patient has hx of alcohol abuse but has not been drinking for the past 2 weeks. CT scan in ED was done that showed worsening fluid collection. General surgery has been consulted and recommended admission to have IR biopsy and drain.  Hospitalist called for admission.  Review of Systems:    Pertinent positives include: abdominal pain,   Constitutional:  No weight loss, night sweats, Fevers, chills, fatigue, weight loss  HEENT:  No headaches, Difficulty swallowing,Tooth/dental problems,Sore throat,  No sneezing, itching, ear ache, nasal congestion, post nasal drip,  Cardio-vascular:  No chest pain, Orthopnea, PND, anasarca, dizziness, palpitations.no Bilateral lower extremity swelling  GI:  No heartburn, indigestion, nausea, vomiting, diarrhea, change in bowel habits, loss of appetite, melena, blood in stool, hematemesis Resp:  no shortness of breath at rest. No dyspnea on exertion, No excess mucus, no productive cough, No non-productive cough, No coughing up of blood.No change in color of mucus.No wheezing. Skin:   no rash or lesions. No jaundice GU:  no dysuria, change in color of urine, no urgency or frequency. No straining to urinate.  No flank pain.  Musculoskeletal:  No joint pain or no joint swelling. No decreased range of motion. No back pain.  Psych:  No change in mood or affect. No depression or anxiety. No memory loss.  Neuro: no localizing neurological complaints, no tingling, no weakness, no double vision, no gait abnormality, no slurred speech, no confusion  Otherwise ROS are negative except for above, 10 systems were reviewed  Past Medical History: Past Medical History  Diagnosis Date  . HYPERTENSION 07/31/2009  . Seizures   . Degenerative joint disease of low back   . Gait disorder   . Neuropathy   . Complex regional pain syndrome of left lower extremity   . Osteopenia   . Memory disorder   . Right humeral fracture   . Essential tremor   . Alcohol abuse    Past Surgical History  Procedure Laterality Date  . Foot surgery      2 occasions  . Shoulder surgery Right   . Vasectomy    . Bladder leision    . Bunionectomy    . Lumbar spine surgery      2 occasions for left L5 radiculopathy  . Cervical spine surgery  08/2010    C5-6-7  . Spinal cord stimulator implant  02/2011     Medications: Prior to Admission medications   Medication Sig Start Date End Date Taking? Authorizing Provider  ciprofloxacin (CIPRO) 500 MG tablet Take 1 tablet (500 mg total) by mouth 2 (two) times daily. 07/23/13  Yes Shanon Brow  Tat, MD  cloNIDine (CATAPRES - DOSED IN MG/24 HR) 0.2 mg/24hr patch Place 1 patch onto the skin once a week.   Yes Historical Provider, MD  lamoTRIgine (LAMICTAL) 25 MG tablet Take 1 tablet (25 mg total) by mouth daily. Take 25 mg (1 tablet) once daily x 2 weeks, then 50mg  (2 tablets) once daily x 2 weeks, then 50mg  (2 tablets) two times a day x 2 weeks. 07/23/13  Yes Orson Eva, MD  levETIRAcetam (KEPPRA) 750 MG tablet Take 1 tablet (750 mg total) by mouth 2 (two) times daily.  Take 750mg  dose until you have titrated back to your original lamotrigine dose 07/23/13  Yes Orson Eva, MD  metroNIDAZOLE (FLAGYL) 500 MG tablet Take 1 tablet (500 mg total) by mouth every 8 (eight) hours. 07/23/13  Yes Orson Eva, MD  pantoprazole (PROTONIX) 40 MG tablet Take 1 tablet (40 mg total) by mouth 2 (two) times daily. 07/23/13  Yes Orson Eva, MD    Allergies:   Allergies  Allergen Reactions  . Lyrica [Pregabalin]     Hallucinations.    . Sulfonamide Derivatives     REACTION: unknown as a child  . Valsartan     REACTION: lightheaded    Social History:  Ambulatory  cane Lives at  Home with family   reports that he has been smoking Cigarettes.  He has been smoking about 1.00 pack per day. He has never used smokeless tobacco. He reports that he drinks alcohol. He reports that he does not use illicit drugs.   Family History: family history includes Colon cancer in his father; Heart disease in his brother.    Physical Exam: Patient Vitals for the past 24 hrs:  BP Temp Temp src Pulse Resp SpO2 Height Weight  08/01/13 0600 - - - 63 - 97 % - -  08/01/13 0446 142/80 mmHg - - 68 18 94 % - -  08/01/13 0300 - - - 65 - 94 % - -  08/01/13 0137 147/79 mmHg 98.2 F (36.8 C) Oral 86 20 100 % 5\' 7"  (1.702 m) 68.04 kg (150 lb)    1. General:  in No Acute distress 2. Psychological: Alert and  Oriented 3. Head/ENT:   Moist  Mucous Membranes                          Head Non traumatic, neck supple                          Normal  Dentition 4. SKIN:  decreased Skin turgor,  Skin clean Dry and intact no rash 5. Heart: Regular rate and rhythm no Murmur, Rub or gallop 6. Lungs: Clear to auscultation bilaterally, no wheezes or crackles   7. Abdomen: Soft, right lower quadrant ad left lower quadrant tenderness, Non distended 8. Lower extremities: no clubbing, cyanosis, or edema 9. Neurologically Grossly intact, moving all 4 extremities equally 10. MSK: Normal range of motion  body mass  index is 23.49 kg/(m^2).   Labs on Admission:   Recent Labs  08/01/13 0213  NA 142  K 3.9  CL 104  CO2 26  GLUCOSE 111*  BUN 7  CREATININE 0.72  CALCIUM 8.8    Recent Labs  08/01/13 0213  AST 16  ALT 7  ALKPHOS 133*  BILITOT 0.5  PROT 6.2  ALBUMIN 2.7*    Recent Labs  08/01/13 0213  LIPASE 14  Recent Labs  08/01/13 0213  WBC 12.0*  NEUTROABS 7.9*  HGB 14.4  HCT 42.2  MCV 105.0*  PLT 312   No results found for this basename: CKTOTAL, CKMB, CKMBINDEX, TROPONINI,  in the last 72 hours No results found for this basename: TSH, T4TOTAL, FREET3, T3FREE, THYROIDAB,  in the last 72 hours No results found for this basename: VITAMINB12, FOLATE, FERRITIN, TIBC, IRON, RETICCTPCT,  in the last 72 hours No results found for this basename: HGBA1C    Estimated Creatinine Clearance: 87.2 ml/min (by C-G formula based on Cr of 0.72). ABG    Component Value Date/Time   TCO2 31 07/17/2013 1939     No results found for this basename: DDIMER     Other results:  :   UA positive nitrite   Cultures: No results found for this basename: sdes, specrequest, cult, reptstatus       Radiological Exams on Admission: Ct Abdomen Pelvis W Contrast  08/01/2013   CLINICAL DATA:  Back pain, radiating into right abdomen. Recent perforated gastric ulcer  EXAM: CT ABDOMEN AND PELVIS WITH CONTRAST  TECHNIQUE: Multidetector CT imaging of the abdomen and pelvis was performed using the standard protocol following bolus administration of intravenous contrast.  CONTRAST:  137mL OMNIPAQUE IOHEXOL 300 MG/ML  SOLN  COMPARISON:  Prior CT from 07/17/2013.  FINDINGS: Small right pleural effusion with associated atelectasis is present. There is mild lingular and left basilar atelectasis as well.  Mild cirrhotic changes of the liver again noted. Small hepatic cysts noted at the hepatic dome, unchanged. Tiny hypodensity adjacent to the gallbladder fossa is too small the characterize by CT, also  stable. Liver is otherwise unremarkable.  Layering hyperdensity is again seen within the gallbladder neck, suggestive of stones and/or sludge. The spleen and adrenal glands are unremarkable.  Complex cystic mass measuring 2.0 x 2.1 cm is again seen within the pancreatic head/uncinate process (series 2, image 35), unchanged.  2.9 cm right renal cyst again noted. Smaller left renal cyst is unchanged as well. Kidneys are otherwise unremarkable without evidence of hydronephrosis or nephrolithiasis.  Discoid hypodense collection measuring 2.6 x 6.8 x 8.1 cm is again seen superior and posterior to the right kidney (series 5, image 31). Overall, this collection is slightly increased in size relative to prior exam. Complex inflammatory changes are seen extending inferiorly from this collection towards the right pericolic gutter. A second heterogeneous loculated collection measures approximately 2.9 x 3.7 x 5.5 cm (series 2, image 40). This collection abuts the posterior aspect of the inferior pole the right kidney. There is inflammatory fat stranding within the adjacent right pericolic gutter and along the right pericolic fascia.  Several additional loculated fluid collection is are again seen within the upper mid and left hemi abdomen. Collection along the midline just inferior to the stomach measures 1.7 x 2.3 cm, similar to prior. Additional irregular collection located slightly more inferiorly measures 2.3 x 4.6 cm (series 2, image 42). These are more loculated well-formed as compared to the prior examination. Inflammatory nodularity is seen adjacent to this collection within the left hemi abdomen (series 2, image 44). A larger collection measuring approximately 7.4 x 3.3 cm is seen along the left lateral aspect of the greater curvature of the stomach (series 2, image 29).  An additional smaller collection is seen dependently within the right pericolic gutter, measuring 1.8 x 3.2 cm (series 2, image 59).  No evidence of  bowel obstruction. Mild inflammatory fat stranding and wall thickening again  seen about the distal portion of the duodenum, slightly improved. Sigmoid diverticulosis noted.  Bladder and prostate are within normal limits.  No free intraperitoneal air identified. A few mildly prominent mesenteric lymph nodes are noted scattered throughout the abdomen, likely reactive.  Prominent aorto bi-iliac atherosclerotic calcifications noted.  Spinal stimulator device again noted. Multilevel degenerative changes are stable. No acute osseous abnormality.  IMPRESSION: 1. Slight interval increase in size of discoid hypodense collection posterior to the right kidney extending inferiorly into the right pericolic gutter as above. Finding likely accounts for provided history of back and right hemi abdominal pain. 2. Additional multiple intra-abdominal fluid collections as detailed above. One of these collections located along the left lateral aspect of the stomach is new as compared to the prior exam, and is positioned within a region of extensive inflammatory changes seen on prior study. Overall, the remaining collections are similar in size as compared to prior study, with overall improved inflammatory mesenteric fat stranding. No free intraperitoneal air identified. 3. Stable appearance of complex cystic mass within the head of the pancreas. Short interval follow-up in 3 months with CT and/or MRI is recommended. 4. Small right pleural effusion with associated atelectasis. 5. Cholelithiasis. 6. Sigmoid diverticulosis without acute diverticulitis. 7. Mild nodularity of the hepatic contour, suggesting cirrhosis.   Electronically Signed   By: Jeannine Boga M.D.   On: 08/01/2013 04:44    Chart has been reviewed  Assessment/Plan  In history of alcohol abuse and recent admission for duodenal ulcer perforation presenting with abdominal pain likely secondary to worsening intra-abdominal fluid collection possible abscess.    Present on Admission:  . Intra-abdominal abscess  - abscess versus fluid collection otherwise. Will cover with IV antibiotics Cipro and Flagyl. Will order IR. percutaneous drainage as recommended by general surgery. Surgery to consult . COPD - albuterol PRN, spiriva . Generalized convulsive epilepsy without mention of intractable epilepsy - switch to  Keppra IV while NPO . HYPERTENSION - continue catapres patch . Chronic pain in left foot - supportive managment  Prophylaxis: SCD, Protonix  CODE STATUS: FULL CODE  Other plan as per orders.  I have spent a total of 55 min on this admission  Ezell Poke 08/01/2013, 6:17 AM

## 2013-08-01 NOTE — ED Provider Notes (Signed)
CSN: [id number]     Arrival date & time 08/01/13  0136 History   First MD Initiated Contact with Patient 08/01/13 0145     Chief Complaint  Patient presents with  . Abdominal Pain   (Consider location/radiation/quality/duration/timing/severity/associated sxs/prior Treatment) HPI Comments: Patient is a 63 year old male who presents today for abdominal pain. Patient states that abdominal pain began yesterday afternoon and has been worsening over the last 4 hours. Patient states the pain is a constant ache rated 2/10 with intermittent sharp pain sensation is rated 7/10. Pain is present in the right lower quadrant and radiates to his right low back. Pain is alleviated with rest and aggravated with movement, deep breathing, and palpation to the area. He denies associated fever, chest pain or shortness of breath, nausea or vomiting, diarrhea, melena or hematochezia, urinary symptoms, and numbness or tingling.  Patient was discharged from the hospital on 07/24/2013 after an admission for suspected perforated duodenal ulcer. Patient was n.p.o. for one week and had diet gradually progressed. He was discharged on course of Cipro/Flagyl which he endorses full compliance with. Patient finished antibiotics yesterday. He has also been compliant with his pantoprazole twice a day. Patient states that prior to this admission he experienced pain in his right low back radiating to his left low back and around to his left lower abdomen. He states this pain feels different. Patient denies a history of abdominal surgeries. He states he had a normal bowel movement yesterday, free of blood.  Patient is a 63 y.o. male presenting with abdominal pain. The history is provided by the patient. No language interpreter was used.  Abdominal Pain Associated symptoms: no chest pain, no diarrhea, no dysuria, no fever, no hematuria, no nausea, no shortness of breath and no vomiting     Past Medical History  Diagnosis Date  .  HYPERTENSION 07/31/2009  . Seizures   . Degenerative joint disease of low back   . Gait disorder   . Neuropathy   . Complex regional pain syndrome of left lower extremity   . Osteopenia   . Memory disorder   . Right humeral fracture   . Essential tremor   . Alcohol abuse    Past Surgical History  Procedure Laterality Date  . Foot surgery      2 occasions  . Shoulder surgery Right   . Vasectomy    . Bladder leision    . Bunionectomy    . Lumbar spine surgery      2 occasions for left L5 radiculopathy  . Cervical spine surgery  08/2010    C5-6-7  . Spinal cord stimulator implant  02/2011   Family History  Problem Relation Age of Onset  . Colon cancer Father   . Heart disease Brother    History  Substance Use Topics  . Smoking status: Current Every Day Smoker -- 1.00 packs/day    Types: Cigarettes  . Smokeless tobacco: Never Used  . Alcohol Use: Yes     Comment: 1/5 Of liquor daily    Review of Systems  Constitutional: Negative for fever.  Respiratory: Negative for shortness of breath.   Cardiovascular: Negative for chest pain.  Gastrointestinal: Positive for abdominal pain. Negative for nausea, vomiting, diarrhea and blood in stool.  Genitourinary: Negative for dysuria and hematuria.  Neurological: Negative for weakness and numbness.  All other systems reviewed and are negative.    Allergies  Lyrica; Sulfonamide derivatives; and Valsartan  Home Medications   Current Outpatient Rx  Name  Route  Sig  Dispense  Refill  . ciprofloxacin (CIPRO) 500 MG tablet   Oral   Take 1 tablet (500 mg total) by mouth 2 (two) times daily.   14 tablet   0   . cloNIDine (CATAPRES - DOSED IN MG/24 HR) 0.2 mg/24hr patch   Transdermal   Place 1 patch onto the skin once a week.         . lamoTRIgine (LAMICTAL) 25 MG tablet   Oral   Take 1 tablet (25 mg total) by mouth daily. Take 25 mg (1 tablet) once daily x 2 weeks, then 50mg  (2 tablets) once daily x 2 weeks, then 50mg  (2  tablets) two times a day x 2 weeks.   120 tablet   1   . levETIRAcetam (KEPPRA) 750 MG tablet   Oral   Take 1 tablet (750 mg total) by mouth 2 (two) times daily. Take 750mg  dose until you have titrated back to your original lamotrigine dose   60 tablet   0   . metroNIDAZOLE (FLAGYL) 500 MG tablet   Oral   Take 1 tablet (500 mg total) by mouth every 8 (eight) hours.   21 tablet   0   . pantoprazole (PROTONIX) 40 MG tablet   Oral   Take 1 tablet (40 mg total) by mouth 2 (two) times daily.   60 tablet   1   . levETIRAcetam (KEPPRA) 500 MG tablet   Oral   Take 1 tablet (500 mg total) by mouth 2 (two) times daily. Restart this dose only AFTER you have titrated back to your previous lamotrigine dose   60 tablet   6    BP 142/80  Pulse 68  Temp(Src) 98.2 F (36.8 C) (Oral)  Resp 18  Ht 5\' 7"  (1.702 m)  Wt 150 lb (68.04 kg)  BMI 23.49 kg/m2  SpO2 94%  Physical Exam  Nursing note and vitals reviewed. Constitutional: He is oriented to person, place, and time. He appears well-developed and well-nourished. No distress.  HENT:  Head: Normocephalic and atraumatic.  Mouth/Throat: Oropharynx is clear and moist. No oropharyngeal exudate.  Eyes: Conjunctivae and EOM are normal. Pupils are equal, round, and reactive to light. No scleral icterus.  Neck: Normal range of motion.  Cardiovascular: Normal rate, regular rhythm and normal heart sounds.   Pulmonary/Chest: Effort normal and breath sounds normal. No respiratory distress. He has no wheezes. He has no rales.  Abdominal: Soft. He exhibits distension (mild). He exhibits no mass. There is tenderness. There is guarding. There is no rebound.  Voluntary guarding, primarily in RLQ. No peritoneal signs.  Musculoskeletal: Normal range of motion.  Neurological: He is alert and oriented to person, place, and time.  Skin: Skin is warm and dry. No rash noted. He is not diaphoretic. No erythema. No pallor.  Psychiatric: He has a normal mood  and affect. His behavior is normal.    ED Course  Procedures (including critical care time) Labs Review Labs Reviewed  CBC WITH DIFFERENTIAL - Abnormal; Notable for the following:    WBC 12.0 (*)    RBC 4.02 (*)    MCV 105.0 (*)    MCH 35.8 (*)    Neutro Abs 7.9 (*)    Monocytes Absolute 1.1 (*)    All other components within normal limits  COMPREHENSIVE METABOLIC PANEL - Abnormal; Notable for the following:    Glucose, Bld 111 (*)    Albumin 2.7 (*)    Alkaline Phosphatase 133 (*)  All other components within normal limits  URINALYSIS, ROUTINE W REFLEX MICROSCOPIC - Abnormal; Notable for the following:    Color, Urine AMBER (*)    Nitrite POSITIVE (*)    Leukocytes, UA SMALL (*)    All other components within normal limits  CLOSTRIDIUM DIFFICILE BY PCR  URINE CULTURE  LIPASE, BLOOD  URINE MICROSCOPIC-ADD ON  CG4 I-STAT (LACTIC ACID)   Imaging Review Ct Abdomen Pelvis W Contrast  08/01/2013   CLINICAL DATA:  Back pain, radiating into right abdomen. Recent perforated gastric ulcer  EXAM: CT ABDOMEN AND PELVIS WITH CONTRAST  TECHNIQUE: Multidetector CT imaging of the abdomen and pelvis was performed using the standard protocol following bolus administration of intravenous contrast.  CONTRAST:  18mL OMNIPAQUE IOHEXOL 300 MG/ML  SOLN  COMPARISON:  Prior CT from 07/17/2013.  FINDINGS: Small right pleural effusion with associated atelectasis is present. There is mild lingular and left basilar atelectasis as well.  Mild cirrhotic changes of the liver again noted. Small hepatic cysts noted at the hepatic dome, unchanged. Tiny hypodensity adjacent to the gallbladder fossa is too small the characterize by CT, also stable. Liver is otherwise unremarkable.  Layering hyperdensity is again seen within the gallbladder neck, suggestive of stones and/or sludge. The spleen and adrenal glands are unremarkable.  Complex cystic mass measuring 2.0 x 2.1 cm is again seen within the pancreatic  head/uncinate process (series 2, image 35), unchanged.  2.9 cm right renal cyst again noted. Smaller left renal cyst is unchanged as well. Kidneys are otherwise unremarkable without evidence of hydronephrosis or nephrolithiasis.  Discoid hypodense collection measuring 2.6 x 6.8 x 8.1 cm is again seen superior and posterior to the right kidney (series 5, image 31). Overall, this collection is slightly increased in size relative to prior exam. Complex inflammatory changes are seen extending inferiorly from this collection towards the right pericolic gutter. A second heterogeneous loculated collection measures approximately 2.9 x 3.7 x 5.5 cm (series 2, image 40). This collection abuts the posterior aspect of the inferior pole the right kidney. There is inflammatory fat stranding within the adjacent right pericolic gutter and along the right pericolic fascia.  Several additional loculated fluid collection is are again seen within the upper mid and left hemi abdomen. Collection along the midline just inferior to the stomach measures 1.7 x 2.3 cm, similar to prior. Additional irregular collection located slightly more inferiorly measures 2.3 x 4.6 cm (series 2, image 42). These are more loculated well-formed as compared to the prior examination. Inflammatory nodularity is seen adjacent to this collection within the left hemi abdomen (series 2, image 44). A larger collection measuring approximately 7.4 x 3.3 cm is seen along the left lateral aspect of the greater curvature of the stomach (series 2, image 29).  An additional smaller collection is seen dependently within the right pericolic gutter, measuring 1.8 x 3.2 cm (series 2, image 59).  No evidence of bowel obstruction. Mild inflammatory fat stranding and wall thickening again seen about the distal portion of the duodenum, slightly improved. Sigmoid diverticulosis noted.  Bladder and prostate are within normal limits.  No free intraperitoneal air identified. A few  mildly prominent mesenteric lymph nodes are noted scattered throughout the abdomen, likely reactive.  Prominent aorto bi-iliac atherosclerotic calcifications noted.  Spinal stimulator device again noted. Multilevel degenerative changes are stable. No acute osseous abnormality.  IMPRESSION: 1. Slight interval increase in size of discoid hypodense collection posterior to the right kidney extending inferiorly into the right pericolic gutter  as above. Finding likely accounts for provided history of back and right hemi abdominal pain. 2. Additional multiple intra-abdominal fluid collections as detailed above. One of these collections located along the left lateral aspect of the stomach is new as compared to the prior exam, and is positioned within a region of extensive inflammatory changes seen on prior study. Overall, the remaining collections are similar in size as compared to prior study, with overall improved inflammatory mesenteric fat stranding. No free intraperitoneal air identified. 3. Stable appearance of complex cystic mass within the head of the pancreas. Short interval follow-up in 3 months with CT and/or MRI is recommended. 4. Small right pleural effusion with associated atelectasis. 5. Cholelithiasis. 6. Sigmoid diverticulosis without acute diverticulitis. 7. Mild nodularity of the hepatic contour, suggesting cirrhosis.   Electronically Signed   By: Jeannine Boga M.D.   On: 08/01/2013 04:44    EKG Interpretation   None       MDM   1. Abdominal pain    62 year old male, recently discharged on 07/24/2013 after inpatient management for suspected duodenal ulcer perforation, presents to the emergency department for recurrence of abdominal pain. Pain in right lower quadrant and radiating to right low back. Patient afebrile and hemodynamically stable. Physical exam significant for focal tenderness in the right lower quadrant with voluntary guarding. No peritoneal signs. Labs today show  leukocytosis of 12.  CT abdomen and pelvis shows no evidence of appendicitis; however, there is a slight increase in a hypodense collection posterior to the right kidney which extends into the pericolic gutter. Overall, radiologist reads improvement compared to prior CT scan on 07/17/2013. I consulted with Dr. Hall Busing of general surgery regarding these changes. He recommends, given size of hypodense collection, that patient have fluid aspirated for culture by interventional radiology. Have consulted with Dr. Roel Cluck of [facility] who will admit. Patient's pain well controlled with IV morphine at this time.   Filed Vitals:   08/01/13 0137 08/01/13 0300 08/01/13 0446  BP: 147/79  142/80  Pulse: 86 65 68  Temp: 98.2 F (36.8 C)    TempSrc: Oral    Resp: 20  18  Height: 5\' 7"  (1.702 m)    Weight: 150 lb (68.04 kg)    SpO2: 100% 94% 94%       Antonietta Breach, PA-C 08/01/13 [id number]

## 2013-08-01 NOTE — H&P (Signed)
Jeffrey Castillo is an 63 y.o. male.   Chief Complaint: Pt was admitted 1/7 with abd pain ; fever---dx perforated gastric ulcer Bowel rest and antibiotics--dc'd 1/14 on antibiotics Flagyl and Cipro continued and pt did well til 1/21 abd pain worsening--described to be low Rt abd to Rt flank across back +N/V/D Came to ER--work up and CT reveals many phlegmon like areas in abd; necrotic fat tissue collections per Dr Laurence Ferrari. Does define Rt retroperitoneal fluid collection - seemingly unchanged from previous CT 1/8. Since pt does complain of pain at this site - would be reasonable to place drain. Scheduled now for Rt retroperitoneal abscess drain placement  HPI: HTN; Sz; alcohol abuse; tremor; DJD  Past Medical History  Diagnosis Date  . HYPERTENSION 07/31/2009  . Seizures   . Degenerative joint disease of low back   . Gait disorder   . Neuropathy   . Complex regional pain syndrome of left lower extremity   . Osteopenia   . Memory disorder   . Right humeral fracture   . Essential tremor   . Alcohol abuse     Past Surgical History  Procedure Laterality Date  . Foot surgery      2 occasions  . Shoulder surgery Right   . Vasectomy    . Bladder leision    . Bunionectomy    . Lumbar spine surgery      2 occasions for left L5 radiculopathy  . Cervical spine surgery  08/2010    C5-6-7  . Spinal cord stimulator implant  02/2011    Family History  Problem Relation Age of Onset  . Colon cancer Father   . Heart disease Brother    Social History:  reports that he has been smoking Cigarettes.  He has been smoking about 1.00 pack per day. He has never used smokeless tobacco. He reports that he drinks alcohol. He reports that he does not use illicit drugs.  Allergies:  Allergies  Allergen Reactions  . Lyrica [Pregabalin]     Hallucinations.    . Sulfonamide Derivatives     REACTION: unknown as a child  . Valsartan     REACTION: lightheaded    Medications Prior to Admission   Medication Sig Dispense Refill  . ciprofloxacin (CIPRO) 500 MG tablet Take 1 tablet (500 mg total) by mouth 2 (two) times daily.  14 tablet  0  . cloNIDine (CATAPRES - DOSED IN MG/24 HR) 0.2 mg/24hr patch Place 1 patch onto the skin once a week.      . lamoTRIgine (LAMICTAL) 25 MG tablet Take 1 tablet (25 mg total) by mouth daily. Take 25 mg (1 tablet) once daily x 2 weeks, then 65m (2 tablets) once daily x 2 weeks, then 567m(2 tablets) two times a day x 2 weeks.  120 tablet  1  . levETIRAcetam (KEPPRA) 750 MG tablet Take 1 tablet (750 mg total) by mouth 2 (two) times daily. Take 75039mose until you have titrated back to your original lamotrigine dose  60 tablet  0  . metroNIDAZOLE (FLAGYL) 500 MG tablet Take 1 tablet (500 mg total) by mouth every 8 (eight) hours.  21 tablet  0  . pantoprazole (PROTONIX) 40 MG tablet Take 1 tablet (40 mg total) by mouth 2 (two) times daily.  60 tablet  1    Results for orders placed during the hospital encounter of 08/01/13 (from the past 48 hour(s))  CBC WITH DIFFERENTIAL     Status: Abnormal  Collection Time    08/01/13  2:13 AM      Result Value Range   WBC 12.0 (*) 4.0 - 10.5 K/uL   RBC 4.02 (*) 4.22 - 5.81 MIL/uL   Hemoglobin 14.4  13.0 - 17.0 g/dL   HCT 42.2  39.0 - 52.0 %   MCV 105.0 (*) 78.0 - 100.0 fL   MCH 35.8 (*) 26.0 - 34.0 pg   MCHC 34.1  30.0 - 36.0 g/dL   RDW 14.1  11.5 - 15.5 %   Platelets 312  150 - 400 K/uL   Neutrophils Relative % 66  43 - 77 %   Neutro Abs 7.9 (*) 1.7 - 7.7 K/uL   Lymphocytes Relative 23  12 - 46 %   Lymphs Abs 2.8  0.7 - 4.0 K/uL   Monocytes Relative 9  3 - 12 %   Monocytes Absolute 1.1 (*) 0.1 - 1.0 K/uL   Eosinophils Relative 2  0 - 5 %   Eosinophils Absolute 0.2  0.0 - 0.7 K/uL   Basophils Relative 0  0 - 1 %   Basophils Absolute 0.0  0.0 - 0.1 K/uL  COMPREHENSIVE METABOLIC PANEL     Status: Abnormal   Collection Time    08/01/13  2:13 AM      Result Value Range   Sodium 142  137 - 147 mEq/L    Potassium 3.9  3.7 - 5.3 mEq/L   Chloride 104  96 - 112 mEq/L   CO2 26  19 - 32 mEq/L   Glucose, Bld 111 (*) 70 - 99 mg/dL   BUN 7  6 - 23 mg/dL   Creatinine, Ser 0.72  0.50 - 1.35 mg/dL   Calcium 8.8  8.4 - 10.5 mg/dL   Total Protein 6.2  6.0 - 8.3 g/dL   Albumin 2.7 (*) 3.5 - 5.2 g/dL   AST 16  0 - 37 U/L   ALT 7  0 - 53 U/L   Alkaline Phosphatase 133 (*) 39 - 117 U/L   Total Bilirubin 0.5  0.3 - 1.2 mg/dL   GFR calc non Af Amer >90  >90 mL/min   GFR calc Af Amer >90  >90 mL/min   Comment: (NOTE)     The eGFR has been calculated using the CKD EPI equation.     This calculation has not been validated in all clinical situations.     eGFR's persistently <90 mL/min signify possible Chronic Kidney     Disease.  LIPASE, BLOOD     Status: None   Collection Time    08/01/13  2:13 AM      Result Value Range   Lipase 14  11 - 59 U/L  CG4 I-STAT (LACTIC ACID)     Status: None   Collection Time    08/01/13  2:22 AM      Result Value Range   Lactic Acid, Venous 1.60  0.5 - 2.2 mmol/L  URINALYSIS, ROUTINE W REFLEX MICROSCOPIC     Status: Abnormal   Collection Time    08/01/13  4:27 AM      Result Value Range   Color, Urine AMBER (*) YELLOW   Comment: BIOCHEMICALS MAY BE AFFECTED BY COLOR   APPearance CLEAR  CLEAR   Specific Gravity, Urine 1.025  1.005 - 1.030   pH 6.0  5.0 - 8.0   Glucose, UA NEGATIVE  NEGATIVE mg/dL   Hgb urine dipstick NEGATIVE  NEGATIVE   Bilirubin Urine NEGATIVE  NEGATIVE   Ketones, ur NEGATIVE  NEGATIVE mg/dL   Protein, ur NEGATIVE  NEGATIVE mg/dL   Urobilinogen, UA 0.2  0.0 - 1.0 mg/dL   Nitrite POSITIVE (*) NEGATIVE   Leukocytes, UA SMALL (*) NEGATIVE  URINE MICROSCOPIC-ADD ON     Status: None   Collection Time    08/01/13  4:27 AM      Result Value Range   Squamous Epithelial / LPF RARE  RARE   WBC, UA 0-2  <3 WBC/hpf   Ct Abdomen Pelvis W Contrast  08/01/2013   CLINICAL DATA:  Back pain, radiating into right abdomen. Recent perforated gastric ulcer   EXAM: CT ABDOMEN AND PELVIS WITH CONTRAST  TECHNIQUE: Multidetector CT imaging of the abdomen and pelvis was performed using the standard protocol following bolus administration of intravenous contrast.  CONTRAST:  137m OMNIPAQUE IOHEXOL 300 MG/ML  SOLN  COMPARISON:  Prior CT from 07/17/2013.  FINDINGS: Small right pleural effusion with associated atelectasis is present. There is mild lingular and left basilar atelectasis as well.  Mild cirrhotic changes of the liver again noted. Small hepatic cysts noted at the hepatic dome, unchanged. Tiny hypodensity adjacent to the gallbladder fossa is too small the characterize by CT, also stable. Liver is otherwise unremarkable.  Layering hyperdensity is again seen within the gallbladder neck, suggestive of stones and/or sludge. The spleen and adrenal glands are unremarkable.  Complex cystic mass measuring 2.0 x 2.1 cm is again seen within the pancreatic head/uncinate process (series 2, image 35), unchanged.  2.9 cm right renal cyst again noted. Smaller left renal cyst is unchanged as well. Kidneys are otherwise unremarkable without evidence of hydronephrosis or nephrolithiasis.  Discoid hypodense collection measuring 2.6 x 6.8 x 8.1 cm is again seen superior and posterior to the right kidney (series 5, image 31). Overall, this collection is slightly increased in size relative to prior exam. Complex inflammatory changes are seen extending inferiorly from this collection towards the right pericolic gutter. A second heterogeneous loculated collection measures approximately 2.9 x 3.7 x 5.5 cm (series 2, image 40). This collection abuts the posterior aspect of the inferior pole the right kidney. There is inflammatory fat stranding within the adjacent right pericolic gutter and along the right pericolic fascia.  Several additional loculated fluid collection is are again seen within the upper mid and left hemi abdomen. Collection along the midline just inferior to the stomach  measures 1.7 x 2.3 cm, similar to prior. Additional irregular collection located slightly more inferiorly measures 2.3 x 4.6 cm (series 2, image 42). These are more loculated well-formed as compared to the prior examination. Inflammatory nodularity is seen adjacent to this collection within the left hemi abdomen (series 2, image 44). A larger collection measuring approximately 7.4 x 3.3 cm is seen along the left lateral aspect of the greater curvature of the stomach (series 2, image 29).  An additional smaller collection is seen dependently within the right pericolic gutter, measuring 1.8 x 3.2 cm (series 2, image 59).  No evidence of bowel obstruction. Mild inflammatory fat stranding and wall thickening again seen about the distal portion of the duodenum, slightly improved. Sigmoid diverticulosis noted.  Bladder and prostate are within normal limits.  No free intraperitoneal air identified. A few mildly prominent mesenteric lymph nodes are noted scattered throughout the abdomen, likely reactive.  Prominent aorto bi-iliac atherosclerotic calcifications noted.  Spinal stimulator device again noted. Multilevel degenerative changes are stable. No acute osseous abnormality.  IMPRESSION: 1. Slight interval increase in  size of discoid hypodense collection posterior to the right kidney extending inferiorly into the right pericolic gutter as above. Finding likely accounts for provided history of back and right hemi abdominal pain. 2. Additional multiple intra-abdominal fluid collections as detailed above. One of these collections located along the left lateral aspect of the stomach is new as compared to the prior exam, and is positioned within a region of extensive inflammatory changes seen on prior study. Overall, the remaining collections are similar in size as compared to prior study, with overall improved inflammatory mesenteric fat stranding. No free intraperitoneal air identified. 3. Stable appearance of complex  cystic mass within the head of the pancreas. Short interval follow-up in 3 months with CT and/or MRI is recommended. 4. Small right pleural effusion with associated atelectasis. 5. Cholelithiasis. 6. Sigmoid diverticulosis without acute diverticulitis. 7. Mild nodularity of the hepatic contour, suggesting cirrhosis.   Electronically Signed   By: Jeannine Boga M.D.   On: 08/01/2013 04:44    Review of Systems  Constitutional: Positive for weight loss. Negative for fever.  Respiratory: Negative for shortness of breath.   Cardiovascular: Negative for chest pain.  Gastrointestinal: Positive for nausea, vomiting and abdominal pain.  Neurological: Positive for weakness. Negative for dizziness and headaches.  Psychiatric/Behavioral: Positive for substance abuse.    Blood pressure 149/76, pulse 60, temperature 98 F (36.7 C), temperature source Oral, resp. rate 20, height _0  (1.702 m), weight 150 lb (68.04 kg), SpO2 98.00%. Physical Exam  Constitutional: He is oriented to person, place, and time. He appears well-developed.  Cardiovascular: Normal rate, regular rhythm and normal heart sounds.   No murmur heard. Respiratory: Effort normal and breath sounds normal. He has no wheezes.  GI: Bowel sounds are normal. There is tenderness.  Tender with deep palpation Complains of Rt flank pain--NT to palpate  Musculoskeletal: Normal range of motion.  Neurological: He is alert and oriented to person, place, and time.  Skin: Skin is warm and dry.  Psychiatric: He has a normal mood and affect. His behavior is normal. Judgment and thought content normal.     Assessment/Plan Retroperitoneal abscess - painful Rt flank Scheduled for drain placement today Pt aware of procedure benefits and risks and agreeable to proceed Consent signed and in chart  Horace Wishon A 08/01/2013, 8:54 AM

## 2013-08-01 NOTE — ED Provider Notes (Signed)
Medical screening examination/treatment/procedure(s) were conducted as a shared visit with non-physician practitioner(s) and myself.  I personally evaluated the patient during the encounter.  EKG Interpretation   None      Patient with recent duodenal also her presents with increased right lower quadrant pain. Questionable fluid collection on CT concerning for abscess. Elevations white blood cell count. Discussed with surgery recommend possible IR intervention. Medicine to see patient in emergency department and admit.  Julianne Rice, MD 08/01/13 [id number]

## 2013-08-01 NOTE — Consult Note (Signed)
Reason for Consult:  Intraabdominal fluid collections and abdominal pain Referring Physician: Daxten Kovalenko is an 63 y.o. male.  HPI: 63 y/o hospitalized on 1/7-14/2015 with abdominal pain and probable duodenal perforation/inflammatory process in the 3rd-4th portions of the duodenum.  Fluid collections on CT right retroperitoneum 2.8 x 5.5 cm, 2.8 x 2.7 cm uncinate collection, along with other fluid collections.  This was treated with medical conservative management/antibiotics.  He showed symptomatic improvement over the next several days and was discharged home on oral antibiotics.   He says he was doing well, he was on clear liquids and some bread at home.  He started having right sided pain yesterday after lunch, it was in the RUQ and went to his back.  He handled it most of the day and went to bed at 11PM.  He awoke with more pain and came to the ER.  He says he has not used alcohol since he went home 07/24/13.  He returns now with CT showing right pleural effusion, Mild cirrhotic changes, and hepatic cyst.  Stones and sludge in GB, 2.0 x 2.1 cystic mass pancreatic head, 2.6 x 6.8 x 8.1 cm right retroperitoneal fluid collection superior to the right kidney, another collection 2.9 x 3.7 x 5.5 posterior inferior pole of the kidney, along with multiple collection throughout the upper and left abdomen.  We are ask to see in consult. He still has some pain right abdomen, on exam at this time.  Lipase was mildly elevated last admit, this time it is normal at 14.  His LFT'S are normal, WBC is 12,000. We are ask to see.  Past Medical History  Diagnosis Date  . HYPERTENSION 07/31/2009  . Seizures None since 2006 on medication   . Degenerative joint disease of low back   . Gait disorder   . Neuropathy   . Complex regional pain syndrome of left lower extremity.  Chronic lower leg/foot rash.   . Osteopenia   . Memory disorder   . Right humeral fracture   . Essential tremor    Ongoing  tobacco use           . Alcohol abuse/remote hx of multiple drug use     Past Surgical History  Procedure Laterality Date  . Foot surgery      2 occasions  . Shoulder surgery Right   . Vasectomy    . Bladder leision    . Bunionectomy    . Lumbar spine surgery      2 occasions for left L5 radiculopathy  . Cervical spine surgery  08/2010    C5-6-7  . Spinal cord stimulator implant  02/2011    Family History  Problem Relation Age of Onset  . Colon cancer Father   . Heart disease Brother     Social History:  reports that he has been smoking Cigarettes.  He has been smoking about 1.00 pack per day. He has never used smokeless tobacco. He reports that he drinks alcohol. He reports that he does not use illicit drugs.  Allergies:  Allergies  Allergen Reactions  . Lyrica [Pregabalin]     Hallucinations.    . Sulfonamide Derivatives     REACTION: unknown as a child  . Valsartan     REACTION: lightheaded    Medications:  Prior to Admission:  Prescriptions prior to admission  Medication Sig Dispense Refill  . ciprofloxacin (CIPRO) 500 MG tablet Take 1 tablet (500 mg total) by mouth 2 (  two) times daily.  14 tablet  0  . cloNIDine (CATAPRES - DOSED IN MG/24 HR) 0.2 mg/24hr patch Place 1 patch onto the skin once a week.      . lamoTRIgine (LAMICTAL) 25 MG tablet Take 1 tablet (25 mg total) by mouth daily. Take 25 mg (1 tablet) once daily x 2 weeks, then $RemoveBef'50mg'LDknoTCrVd$  (2 tablets) once daily x 2 weeks, then $RemoveBef'50mg'yyHXXbPMJV$  (2 tablets) two times a day x 2 weeks.  120 tablet  1  . levETIRAcetam (KEPPRA) 750 MG tablet Take 1 tablet (750 mg total) by mouth 2 (two) times daily. Take $RemoveBef'750mg'tTFAKVyzbu$  dose until you have titrated back to your original lamotrigine dose  60 tablet  0  . metroNIDAZOLE (FLAGYL) 500 MG tablet Take 1 tablet (500 mg total) by mouth every 8 (eight) hours.  21 tablet  0  . pantoprazole (PROTONIX) 40 MG tablet Take 1 tablet (40 mg total) by mouth 2 (two) times daily.  60 tablet  1   Scheduled: .  [START ON 08/03/2013] cloNIDine  0.2 mg Transdermal Weekly  . docusate sodium  100 mg Oral BID  . ertapenem  1 g Intravenous Q24H  . folic acid  1 mg Intravenous Daily  . levETIRAcetam  750 mg Intravenous BID  . pantoprazole (PROTONIX) IV  40 mg Intravenous Q12H  . sodium chloride  3 mL Intravenous Q12H  . thiamine  100 mg Intravenous Daily  . tiotropium  18 mcg Inhalation Daily   Continuous: . sodium chloride     ZOX:[id number], acetaminophen, albuterol, HYDROcodone-acetaminophen, morphine injection, ondansetron (ZOFRAN) IV, ondansetron Anti-infectives   Start     Dose/Rate Route Frequency Ordered Stop   08/01/13 1000  ertapenem (INVANZ) 1 g in sodium chloride 0.9 % 50 mL IVPB     1 g 100 mL/hr over 30 Minutes Intravenous Every 24 hours 08/01/13 0852     08/01/13 0930  ciprofloxacin (CIPRO) IVPB 400 mg  Status:  Discontinued     400 mg 200 mL/hr over 60 Minutes Intravenous Every 12 hours 08/01/13 0917 08/01/13 0951   08/01/13 0930  metroNIDAZOLE (FLAGYL) IVPB 500 mg  Status:  Discontinued     500 mg 100 mL/hr over 60 Minutes Intravenous Every 8 hours 08/01/13 0917 08/01/13 0951   08/01/13 0530  cephALEXin (KEFLEX) capsule 500 mg     500 mg Oral  Once 08/01/13 1478 08/01/13 0604      Results for orders placed during the hospital encounter of 08/01/13 (from the past 48 hour(s))  CBC WITH DIFFERENTIAL     Status: Abnormal   Collection Time    08/01/13  2:13 AM      Result Value Range   WBC 12.0 (*) 4.0 - 10.5 K/uL   RBC 4.02 (*) 4.22 - 5.81 MIL/uL   Hemoglobin 14.4  13.0 - 17.0 g/dL   HCT 42.2  39.0 - 52.0 %   MCV 105.0 (*) 78.0 - 100.0 fL   MCH 35.8 (*) 26.0 - 34.0 pg   MCHC 34.1  30.0 - 36.0 g/dL   RDW 14.1  11.5 - 15.5 %   Platelets 312  150 - 400 K/uL   Neutrophils Relative % 66  43 - 77 %   Neutro Abs 7.9 (*) 1.7 - 7.7 K/uL   Lymphocytes Relative 23  12 - 46 %   Lymphs Abs 2.8  0.7 - 4.0 K/uL   Monocytes Relative 9  3 - 12 %   Monocytes Absolute 1.1 (*) 0.1 - 1.0  K/uL   Eosinophils Relative 2  0 - 5 %   Eosinophils Absolute 0.2  0.0 - 0.7 K/uL   Basophils Relative 0  0 - 1 %   Basophils Absolute 0.0  0.0 - 0.1 K/uL  COMPREHENSIVE METABOLIC PANEL     Status: Abnormal   Collection Time    08/01/13  2:13 AM      Result Value Range   Sodium 142  137 - 147 mEq/L   Potassium 3.9  3.7 - 5.3 mEq/L   Chloride 104  96 - 112 mEq/L   CO2 26  19 - 32 mEq/L   Glucose, Bld 111 (*) 70 - 99 mg/dL   BUN 7  6 - 23 mg/dL   Creatinine, Ser 0.72  0.50 - 1.35 mg/dL   Calcium 8.8  8.4 - 10.5 mg/dL   Total Protein 6.2  6.0 - 8.3 g/dL   Albumin 2.7 (*) 3.5 - 5.2 g/dL   AST 16  0 - 37 U/L   ALT 7  0 - 53 U/L   Alkaline Phosphatase 133 (*) 39 - 117 U/L   Total Bilirubin 0.5  0.3 - 1.2 mg/dL   GFR calc non Af Amer >90  >90 mL/min   GFR calc Af Amer >90  >90 mL/min   Comment: (NOTE)     The eGFR has been calculated using the CKD EPI equation.     This calculation has not been validated in all clinical situations.     eGFR's persistently <90 mL/min signify possible Chronic Kidney     Disease.  LIPASE, BLOOD     Status: None   Collection Time    08/01/13  2:13 AM      Result Value Range   Lipase 14  11 - 59 U/L  CG4 I-STAT (LACTIC ACID)     Status: None   Collection Time    08/01/13  2:22 AM      Result Value Range   Lactic Acid, Venous 1.60  0.5 - 2.2 mmol/L  URINALYSIS, ROUTINE W REFLEX MICROSCOPIC     Status: Abnormal   Collection Time    08/01/13  4:27 AM      Result Value Range   Color, Urine AMBER (*) YELLOW   Comment: BIOCHEMICALS MAY BE AFFECTED BY COLOR   APPearance CLEAR  CLEAR   Specific Gravity, Urine 1.025  1.005 - 1.030   pH 6.0  5.0 - 8.0   Glucose, UA NEGATIVE  NEGATIVE mg/dL   Hgb urine dipstick NEGATIVE  NEGATIVE   Bilirubin Urine NEGATIVE  NEGATIVE   Ketones, ur NEGATIVE  NEGATIVE mg/dL   Protein, ur NEGATIVE  NEGATIVE mg/dL   Urobilinogen, UA 0.2  0.0 - 1.0 mg/dL   Nitrite POSITIVE (*) NEGATIVE   Leukocytes, UA SMALL (*) NEGATIVE   URINE MICROSCOPIC-ADD ON     Status: None   Collection Time    08/01/13  4:27 AM      Result Value Range   Squamous Epithelial / LPF RARE  RARE   WBC, UA 0-2  <3 WBC/hpf    Ct Abdomen Pelvis W Contrast  08/01/2013   CLINICAL DATA:  Back pain, radiating into right abdomen. Recent perforated gastric ulcer  EXAM: CT ABDOMEN AND PELVIS WITH CONTRAST  TECHNIQUE: Multidetector CT imaging of the abdomen and pelvis was performed using the standard protocol following bolus administration of intravenous contrast.  CONTRAST:  133mL OMNIPAQUE IOHEXOL 300 MG/ML  SOLN  COMPARISON:  Prior CT from  07/17/2013.  FINDINGS: Small right pleural effusion with associated atelectasis is present. There is mild lingular and left basilar atelectasis as well.  Mild cirrhotic changes of the liver again noted. Small hepatic cysts noted at the hepatic dome, unchanged. Tiny hypodensity adjacent to the gallbladder fossa is too small the characterize by CT, also stable. Liver is otherwise unremarkable.  Layering hyperdensity is again seen within the gallbladder neck, suggestive of stones and/or sludge. The spleen and adrenal glands are unremarkable.  Complex cystic mass measuring 2.0 x 2.1 cm is again seen within the pancreatic head/uncinate process (series 2, image 35), unchanged.  2.9 cm right renal cyst again noted. Smaller left renal cyst is unchanged as well. Kidneys are otherwise unremarkable without evidence of hydronephrosis or nephrolithiasis.  Discoid hypodense collection measuring 2.6 x 6.8 x 8.1 cm is again seen superior and posterior to the right kidney (series 5, image 31). Overall, this collection is slightly increased in size relative to prior exam. Complex inflammatory changes are seen extending inferiorly from this collection towards the right pericolic gutter. A second heterogeneous loculated collection measures approximately 2.9 x 3.7 x 5.5 cm (series 2, image 40). This collection abuts the posterior aspect of the  inferior pole the right kidney. There is inflammatory fat stranding within the adjacent right pericolic gutter and along the right pericolic fascia.  Several additional loculated fluid collection is are again seen within the upper mid and left hemi abdomen. Collection along the midline just inferior to the stomach measures 1.7 x 2.3 cm, similar to prior. Additional irregular collection located slightly more inferiorly measures 2.3 x 4.6 cm (series 2, image 42). These are more loculated well-formed as compared to the prior examination. Inflammatory nodularity is seen adjacent to this collection within the left hemi abdomen (series 2, image 44). A larger collection measuring approximately 7.4 x 3.3 cm is seen along the left lateral aspect of the greater curvature of the stomach (series 2, image 29).  An additional smaller collection is seen dependently within the right pericolic gutter, measuring 1.8 x 3.2 cm (series 2, image 59).  No evidence of bowel obstruction. Mild inflammatory fat stranding and wall thickening again seen about the distal portion of the duodenum, slightly improved. Sigmoid diverticulosis noted.  Bladder and prostate are within normal limits.  No free intraperitoneal air identified. A few mildly prominent mesenteric lymph nodes are noted scattered throughout the abdomen, likely reactive.  Prominent aorto bi-iliac atherosclerotic calcifications noted.  Spinal stimulator device again noted. Multilevel degenerative changes are stable. No acute osseous abnormality.  IMPRESSION: 1. Slight interval increase in size of discoid hypodense collection posterior to the right kidney extending inferiorly into the right pericolic gutter as above. Finding likely accounts for provided history of back and right hemi abdominal pain. 2. Additional multiple intra-abdominal fluid collections as detailed above. One of these collections located along the left lateral aspect of the stomach is new as compared to the prior  exam, and is positioned within a region of extensive inflammatory changes seen on prior study. Overall, the remaining collections are similar in size as compared to prior study, with overall improved inflammatory mesenteric fat stranding. No free intraperitoneal air identified. 3. Stable appearance of complex cystic mass within the head of the pancreas. Short interval follow-up in 3 months with CT and/or MRI is recommended. 4. Small right pleural effusion with associated atelectasis. 5. Cholelithiasis. 6. Sigmoid diverticulosis without acute diverticulitis. 7. Mild nodularity of the hepatic contour, suggesting cirrhosis.   Electronically Signed  By: Jeannine Boga M.D.   On: 08/01/2013 04:44    Review of Systems  Constitutional: Negative.   HENT: Negative.   Eyes: Negative.   Respiratory: Positive for cough and wheezing.        Getting over cold with wife, both smoke  Cardiovascular: Negative.   Gastrointestinal: Positive for heartburn (occasional) and abdominal pain (started yesterday afternoon got progressively worse thru the evening). Negative for nausea, vomiting, diarrhea, constipation, blood in stool and melena.  Genitourinary: Negative.   Musculoskeletal: Positive for back pain (chronic back pain controlled with stimulator).       LE chronic pain well controlled. He has a chronic regional pain syndrome both lower legs and is rather sedentary  Skin: Positive for rash (Rash on feet is chronic).  Neurological: Positive for seizures (none since 2006, on medications).  Endo/Heme/Allergies: Negative.   Psychiatric/Behavioral: Negative.    Blood pressure 132/77, pulse 63, temperature 98.4 F (36.9 C), temperature source Oral, resp. rate 18, height 5' 7" (1.702 m), weight 68.04 kg (150 lb), SpO2 98.00%. Physical Exam  Constitutional: He is oriented to person, place, and time. He appears well-developed and well-nourished. No distress.  HENT:  Head: Normocephalic and atraumatic.  Nose:  Nose normal.  Eyes: Conjunctivae and EOM are normal. Pupils are equal, round, and reactive to light. Right eye exhibits no discharge. Left eye exhibits no discharge. No scleral icterus.  Neck: Normal range of motion. Neck supple. No JVD present. No tracheal deviation present. No thyromegaly present.  Cardiovascular: Normal rate, regular rhythm, normal heart sounds and intact distal pulses.   No murmur heard. Respiratory: Effort normal and breath sounds normal. No respiratory distress. He has no wheezes. He has no rales. He exhibits no tenderness.  GI: Soft. He exhibits distension (mild distension). He exhibits no mass. There is tenderness (tender right side, mid and RUQ, no peritionitis). There is no rebound and no guarding.  Musculoskeletal: He exhibits no edema.  Lymphadenopathy:    He has no cervical adenopathy.  Neurological: He is alert and oriented to person, place, and time. No cranial nerve deficit.  Skin: Skin is warm and dry. Rash noted. He is not diaphoretic.  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 His behavior is normal. Judgment and thought content normal.    Assessment/Plan: 1.  Recurrent abdominal pain, and multiple fluid collections/prior duodenal perforation treated medically. 2.  Hx of ETOH abuse and possible pancreatitis  3.  Tobacco use/COPD 4.  Hx of seizures on medication none since 2006 5.  Hypertension 6.  Chronic lower back pain with spinal cord stimulator implant 2012  Cervical surgery 2012 Dr Arnoldo Morale 7.  Neuropathy/gait disorder/Complex regional pain syndrome (sedentary uses cane outside of home) 8.  Deconditioning/malnutrition 9.  Memory disorder   Plan:  He is to undergo IR drainage of the retroperitoneal fluid collection that is enlarging.  We are starting Invanz, PPI, and he will need GI consult.  I did not see a consult from GI in the past and I will check with Dr. Doyle Askew later.  We will  follow with you.  Dodi Leu 08/01/2013, 8:31 AM

## 2013-08-01 NOTE — Progress Notes (Addendum)
Pt seen at bedside, examined and hemodynamically stable. Please see note by Dr. Roel Cluck for details. IR consult requested, will follow up on recommendations. Will ask GI for further evaluation. Surgery and IR consulted.   Faye Ramsay, MD  [facility] Pager [phone]  If 7PM-7AM, please contact night-coverage [email or web address] Password [email or web address]

## 2013-08-02 DIAGNOSIS — R269 Unspecified abnormalities of gait and mobility: Secondary | ICD-10-CM

## 2013-08-02 DIAGNOSIS — F1010 Alcohol abuse, uncomplicated: Secondary | ICD-10-CM

## 2013-08-02 LAB — COMPREHENSIVE METABOLIC PANEL
ALT: <5 U/L (ref 0–53)
AST: 10 U/L (ref 0–37)
Albumin: 2.1 g/dL — ABNORMAL LOW (ref 3.5–5.2)
Alkaline Phosphatase: 90 U/L (ref 39–117)
BUN: 6 mg/dL (ref 6–23)
CO2: 25 mEq/L (ref 19–32)
Calcium: 7.9 mg/dL — ABNORMAL LOW (ref 8.4–10.5)
Chloride: 104 mEq/L (ref 96–112)
Creatinine, Ser: 0.69 mg/dL (ref 0.50–1.35)
GFR calc Af Amer: >90 mL/min (ref >90)
GFR calc non Af Amer: >90 mL/min (ref >90)
Glucose, Bld: 101 mg/dL — ABNORMAL HIGH (ref 70–99)
Potassium: 3.8 mEq/L (ref 3.7–5.3)
Sodium: 138 mEq/L (ref 137–147)
Total Bilirubin: 0.4 mg/dL (ref 0.3–1.2)
Total Protein: 5.2 g/dL — ABNORMAL LOW (ref 6.0–8.3)

## 2013-08-02 LAB — CBC
HCT: 37.4 % — ABNORMAL LOW (ref 39.0–52.0)
Hemoglobin: 12.4 g/dL — ABNORMAL LOW (ref 13.0–17.0)
MCH: 35 pg — ABNORMAL HIGH (ref 26.0–34.0)
MCHC: 33.2 g/dL (ref 30.0–36.0)
MCV: 105.6 fL — ABNORMAL HIGH (ref 78.0–100.0)
Platelets: 264 10*3/uL (ref 150–400)
RBC: 3.54 MIL/uL — ABNORMAL LOW (ref 4.22–5.81)
RDW: 14.1 % (ref 11.5–15.5)
WBC: 9.3 10*3/uL (ref 4.0–10.5)

## 2013-08-02 LAB — URINE CULTURE
Colony Count: NO GROWTH
Culture: NO GROWTH

## 2013-08-02 LAB — MAGNESIUM: Magnesium: 1.6 mg/dL (ref 1.5–2.5)

## 2013-08-02 LAB — PHOSPHORUS: Phosphorus: 3.5 mg/dL (ref 2.3–4.6)

## 2013-08-02 LAB — TSH: TSH: 4.042 u[IU]/mL (ref 0.350–4.500)

## 2013-08-02 NOTE — Progress Notes (Signed)
Received report from Ene, RN and I agree with her assessment.  Will continue to monitor.

## 2013-08-02 NOTE — Progress Notes (Signed)
Patient ID: Jeffrey Castillo, male   DOB: Mar 07, 1951, 63 y.o.   MRN: [id number]  [facility] PROGRESS NOTE  Jeffrey Castillo [id number] DOB: [birth year] DOA: 08/01/2013 PCP: [name], MD  Brief narrative: 63 y.o. Male with history of alcohol abuse, recent admission earlier this month with suspected perforated peptic ulcer but further studies demonstrating no ulcer and he was sent home on ABX and PPI, presented to [facility] ED with main concern of several weeks duration of recurrent and intermittent right lower quadrant abdominal pain, 7/10 in severity when present and non radiating, no specific alleviating factors, worse with eating, associated with nausea, chills, night sweats. Repeat CT abd in ED showed recurrent retroperitoneal abscess involving the pancreas and several loculated fluid collections in the upper mid and hemiabdomen just inferior to the stomach. [facility] asked to admit for further evaluation. IR consulted for assistance with abscess drain, surgery and GI consulted as well for further assistance.   Active Problems:   Intra-abdominal abscess - s/p placement of percutaneous drainage catheter into the right posterior para renal fascia retroperitoneal fluid collection - Approximately 60 mL of purulent fluid was successfully aspirated and sample was sent for culture and sensitivities.     - pt reports felling well this AM, slight discomfort  - continue Ertapenem day #2, supportive care with analgesia as needed    C O P D - this appears to be clinically stable, pt maintaining oxygen saturation at target range    Generalized convulsive epilepsy without mention of intractable epilepsy - stable inpatient, continue keppra    HTN - reasonable inpatient control    Suspected Duodenal ulcer perforation - appreciate GI input - plan to proceed with small bowel series to rule out small intestine involvement in AM - continue Protonix, antibiotics, drainage     Leukocytosis - secondary to  abscess as noted above, continue Ertapenem day #2 - WBC trending down and is WNL this AM  - CBC in AM  Consultants:  GI  Surgery  IR  Procedures/Studies: Ct Abdomen Pelvis W Contrast   08/01/2013    1. Slight interval increase in size of discoid hypodense collection posterior to the right kidney extending inferiorly into the right pericolic gutter as above. Finding likely accounts for provided history of back and right hemi abdominal pain.  2. Additional multiple intra-abdominal fluid collections as detailed above. One of these collections located along the left lateral aspect of the stomach is new as compared to the prior exam, and is positioned within a region of extensive inflammatory changes seen on prior study. Overall, the remaining collections are similar in size as compared to prior study, with overall improved inflammatory mesenteric fat stranding. No free intraperitoneal air identified.  3. Stable appearance of complex cystic mass within the head of the pancreas. Short interval follow-up in 3 months with CT and/or MRI is recommended.  4. Small right pleural effusion with associated atelectasis.  5. Cholelithiasis.  6. Sigmoid diverticulosis without acute diverticulitis.  7. Mild nodularity of the hepatic contour, suggesting cirrhosis.     Ct Image Guided Drainage By Percutaneous Catheter 08/01/2013  Successful placement of a 12 French percutaneous drainage catheter into the right posterior para renal fascia retroperitoneal fluid collection. Approximately 60 mL of purulent fluid was successfully aspirated. A sample was sent for culture and sensitivities.    Antibiotics:  Ertapenem 01/22 -->  Code Status: Full Family Communication: Pt at bedside Disposition Plan: Home when medically stable  HPI/Subjective: No events overnight.  Objective: Filed Vitals:   08/02/13 0009 08/02/13 0348 08/02/13 1128 08/02/13 1438  BP: 101/61 141/74  122/69  Pulse: 63 63  60  Temp: 99 F  (37.2 C) 99.1 F (37.3 C)  97.9 F (36.6 C)  TempSrc: Oral Oral  Oral  Resp: 15 16  18   Height:      Weight:      SpO2: 92% 90% 96% 94%    Intake/Output Summary (Last 24 hours) at 08/02/13 1802 Last data filed at 08/02/13 1300  Gross per 24 hour  Intake  467.5 ml  Output   1135 ml  Net -667.5 ml    Exam:   General:  Pt is alert, follows commands appropriately, not in acute distress  Cardiovascular: Regular rate and rhythm, S1/S2, no murmurs, no rubs, no gallops  Respiratory: Clear to auscultation bilaterally, no wheezing, no crackles, no rhonchi  Abdomen: Soft, slightly tender in epigastric area, non distended, bowel sounds present, no guarding  Extremities: No edema, pulses DP and PT palpable bilaterally  Neuro: Grossly nonfocal  Data Reviewed: Basic Metabolic Panel:  Recent Labs Lab 08/01/13 0213 08/02/13 0451  NA 142 138  K 3.9 3.8  CL 104 104  CO2 26 25  GLUCOSE 111* 101*  BUN 7 6  CREATININE 0.72 0.69  CALCIUM 8.8 7.9*  MG  --  1.6  PHOS  --  3.5   Liver Function Tests:  Recent Labs Lab 08/01/13 0213 08/02/13 0451  AST 16 10  ALT 7 <5  ALKPHOS 133* 90  BILITOT 0.5 0.4  PROT 6.2 5.2*  ALBUMIN 2.7* 2.1*    Recent Labs Lab 08/01/13 0213  LIPASE 14   CBC:  Recent Labs Lab 08/01/13 0213 08/02/13 0451  WBC 12.0* 9.3  NEUTROABS 7.9*  --   HGB 14.4 12.4*  HCT 42.2 37.4*  MCV 105.0* 105.6*  PLT 312 264    Recent Results (from the past 240 hour(s))  URINE CULTURE     Status: None   Collection Time    08/01/13  4:27 AM      Result Value Range Status   Specimen Description URINE, CLEAN CATCH   Final   Special Requests NONE   Final   Culture  Setup Time     Final   Value: 08/01/2013 08:57     Performed at [facility] Count     Final   Value: NO GROWTH     Performed at [facility]   Culture     Final   Value: NO GROWTH     Performed at [facility]   Report Status 08/02/2013 FINAL   Final   CULTURE, ROUTINE-ABSCESS     Status: None   Collection Time    08/01/13  1:15 PM      Result Value Range Status   Specimen Description OTHER RETROPERITONEAL ABSCESS RT FLANK   Final   Special Requests NONE   Final   Gram Stain     Final   Value: NO WBC SEEN     NO SQUAMOUS EPITHELIAL CELLS SEEN     NO ORGANISMS SEEN     Performed at [facility]   Culture     Final   Value: NO GROWTH 1 DAY     Performed at [facility]   Report Status PENDING   Incomplete     Scheduled Meds: . [START ON 08/03/2013] cloNIDine  0.2 mg Transdermal Weekly  . docusate  sodium  100 mg Oral BID  . ertapenem  1 g Intravenous Q24H  . folic acid  1 mg Intravenous Daily  . levETIRAcetam  750 mg Intravenous BID  . pantoprazole (PROTONIX) IV  40 mg Intravenous Q12H  . sodium chloride  3 mL Intravenous Q12H  . thiamine  100 mg Intravenous Daily  . tiotropium  18 mcg Inhalation Daily   Continuous Infusions:    Faye Ramsay, MD  [facility] Pager [phone]  If 7PM-7AM, please contact night-coverage [email or web address] Password [email or web address] 08/02/2013, 6:02 PM   LOS: 1 day

## 2013-08-02 NOTE — Progress Notes (Signed)
Subjective: Still pretty sore at drain site and anterior abdomen.  Objective: Vital signs in last 24 hours: Temp:  [98 F (36.7 C)-99.1 F (37.3 C)] 99.1 F (37.3 C) (01/23 0348) Pulse Rate:  [60-68] 63 (01/23 0348) Resp:  [11-20] 16 (01/23 0348) BP: (101-156)/(53-79) 141/74 mmHg (01/23 0348) SpO2:  [90 %-100 %] 90 % (01/23 0348) Last BM Date: 08/01/13 60 ml of purulent fluid drained yesterday by IR Another 20 ml on I/O from floor TM 99.1, VSS Labs OK Regular diet Intake/Output from previous day: 01/22 0701 - 01/23 0700 In: 885 [P.O.:120; I.V.:500; IV Piggyback:265] Out: 062 [Urine:975; Drains:20] Intake/Output this shift:    General appearance: alert, cooperative and no distress GI: soft, still sore and tender to palpation, worst is posterior drain site,.  Drain fluid is cloudy serous colored fluid.  Lab Results:   Recent Labs  08/01/13 0213 08/02/13 0451  WBC 12.0* 9.3  HGB 14.4 12.4*  HCT 42.2 37.4*  PLT 312 264    BMET  Recent Labs  08/01/13 0213 08/02/13 0451  NA 142 138  K 3.9 3.8  CL 104 104  CO2 26 25  GLUCOSE 111* 101*  BUN 7 6  CREATININE 0.72 0.69  CALCIUM 8.8 7.9*   PT/INR  Recent Labs  08/01/13 0912  LABPROT 16.5*  INR 1.37     Recent Labs Lab 08/01/13 0213 08/02/13 0451  AST 16 10  ALT 7 <5  ALKPHOS 133* 90  BILITOT 0.5 0.4  PROT 6.2 5.2*  ALBUMIN 2.7* 2.1*     Lipase     Component Value Date/Time   LIPASE 14 08/01/2013 0213     Studies/Results: Ct Abdomen Pelvis W Contrast  08/01/2013   CLINICAL DATA:  Back pain, radiating into right abdomen. Recent perforated gastric ulcer  EXAM: CT ABDOMEN AND PELVIS WITH CONTRAST  TECHNIQUE: Multidetector CT imaging of the abdomen and pelvis was performed using the standard protocol following bolus administration of intravenous contrast.  CONTRAST:  151mL OMNIPAQUE IOHEXOL 300 MG/ML  SOLN  COMPARISON:  Prior CT from 07/17/2013.  FINDINGS: Small right pleural effusion with  associated atelectasis is present. There is mild lingular and left basilar atelectasis as well.  Mild cirrhotic changes of the liver again noted. Small hepatic cysts noted at the hepatic dome, unchanged. Tiny hypodensity adjacent to the gallbladder fossa is too small the characterize by CT, also stable. Liver is otherwise unremarkable.  Layering hyperdensity is again seen within the gallbladder neck, suggestive of stones and/or sludge. The spleen and adrenal glands are unremarkable.  Complex cystic mass measuring 2.0 x 2.1 cm is again seen within the pancreatic head/uncinate process (series 2, image 35), unchanged.  2.9 cm right renal cyst again noted. Smaller left renal cyst is unchanged as well. Kidneys are otherwise unremarkable without evidence of hydronephrosis or nephrolithiasis.  Discoid hypodense collection measuring 2.6 x 6.8 x 8.1 cm is again seen superior and posterior to the right kidney (series 5, image 31). Overall, this collection is slightly increased in size relative to prior exam. Complex inflammatory changes are seen extending inferiorly from this collection towards the right pericolic gutter. A second heterogeneous loculated collection measures approximately 2.9 x 3.7 x 5.5 cm (series 2, image 40). This collection abuts the posterior aspect of the inferior pole the right kidney. There is inflammatory fat stranding within the adjacent right pericolic gutter and along the right pericolic fascia.  Several additional loculated fluid collection is are again seen within the upper mid and left  hemi abdomen. Collection along the midline just inferior to the stomach measures 1.7 x 2.3 cm, similar to prior. Additional irregular collection located slightly more inferiorly measures 2.3 x 4.6 cm (series 2, image 42). These are more loculated well-formed as compared to the prior examination. Inflammatory nodularity is seen adjacent to this collection within the left hemi abdomen (series 2, image 44). A larger  collection measuring approximately 7.4 x 3.3 cm is seen along the left lateral aspect of the greater curvature of the stomach (series 2, image 29).  An additional smaller collection is seen dependently within the right pericolic gutter, measuring 1.8 x 3.2 cm (series 2, image 59).  No evidence of bowel obstruction. Mild inflammatory fat stranding and wall thickening again seen about the distal portion of the duodenum, slightly improved. Sigmoid diverticulosis noted.  Bladder and prostate are within normal limits.  No free intraperitoneal air identified. A few mildly prominent mesenteric lymph nodes are noted scattered throughout the abdomen, likely reactive.  Prominent aorto bi-iliac atherosclerotic calcifications noted.  Spinal stimulator device again noted. Multilevel degenerative changes are stable. No acute osseous abnormality.  IMPRESSION: 1. Slight interval increase in size of discoid hypodense collection posterior to the right kidney extending inferiorly into the right pericolic gutter as above. Finding likely accounts for provided history of back and right hemi abdominal pain. 2. Additional multiple intra-abdominal fluid collections as detailed above. One of these collections located along the left lateral aspect of the stomach is new as compared to the prior exam, and is positioned within a region of extensive inflammatory changes seen on prior study. Overall, the remaining collections are similar in size as compared to prior study, with overall improved inflammatory mesenteric fat stranding. No free intraperitoneal air identified. 3. Stable appearance of complex cystic mass within the head of the pancreas. Short interval follow-up in 3 months with CT and/or MRI is recommended. 4. Small right pleural effusion with associated atelectasis. 5. Cholelithiasis. 6. Sigmoid diverticulosis without acute diverticulitis. 7. Mild nodularity of the hepatic contour, suggesting cirrhosis.   Electronically Signed   By:  Jeannine Boga M.D.   On: 08/01/2013 04:44   Ct Image Guided Drainage By Percutaneous Catheter  08/01/2013   CLINICAL DATA:  63 year old male with a history of recent perforated gastric ulcer. He has multifocal regions of abnormality throughout his abdomen and deep pelvis. The majority of the collections represent a combination of fat necrosis and inflammatory phlegmon. However, there is 1 peripherally enhancing centrally low attenuation collection in the posterior pararenal fascia concerning for abscess. The patient is experienced continued pain, and fever despite palpation antibiotics. Percutaneous drainage is warranted.  EXAM: CT IMAGE GUIDED DRAINAGE BY PERCUTANEOUS CATHETER  Date: 08/01/2013  TECHNIQUE: Informed consent was obtained from the patient following explanation of the procedure, risks, benefits and alternatives. The patient understands, agrees and consents for the procedure. All questions were addressed. A time out was performed.  Maximal barrier sterile technique utilized including caps, mask, sterile gowns, sterile gloves, large sterile drape, hand hygiene, and Betadine skin prep.  A planning axial CT scan was performed. The loculated fluid collection along the right posterior para renal fascia was successfully identified. An appropriate skin entry site was selected and marked. Local anesthesia was attained by infiltration with 1% lidocaine. A small dermatotomy was made. Using CT fluoroscopic guidance, an 18 gauge trocar needle was carefully advanced into the fluid collection. An Amplatz wire was advanced coaxially through the needle. The skin tract was then serially dilated to 12  [place]. A Cook 12 [place] multipurpose drain was then advanced over the wire and formed within the fluid collection. Approximately 60 mL of frankly purulent fluid was successfully aspirated. A sample was sent for culture and sensitivities. The catheter was then secured to the skin with 0 Prolene suture and bumper  technique. A sterile bandage was placed. The patient tolerated the procedure well.  ANESTHESIA/SEDATION: Moderate (conscious) sedation was used. Two mg Versed, 100 mcg Fentanyl were administered intravenously. The patient's vital signs were monitored continuously by radiology nursing throughout the procedure.  Sedation Time: 25 minutes  PROCEDURE: 1. CT-guided drain placement Interventional Radiologist:  Criselda Peaches, MD  IMPRESSION: Successful placement of a 22 French percutaneous drainage catheter into the right posterior para renal fascia retroperitoneal fluid collection. Approximately 60 mL of purulent fluid was successfully aspirated. A sample was sent for culture and sensitivities.  Signed,  Criselda Peaches, MD  [facility]  [facility]   Electronically Signed   By: Jacqulynn Cadet M.D.   On: 08/01/2013 17:49    Medications: . [START ON 08/03/2013] cloNIDine  0.2 mg Transdermal Weekly  . docusate sodium  100 mg Oral BID  . ertapenem  1 g Intravenous Q24H  . folic acid  1 mg Intravenous Daily  . levETIRAcetam  750 mg Intravenous BID  . pantoprazole (PROTONIX) IV  40 mg Intravenous Q12H  . sodium chloride  3 mL Intravenous Q12H  . thiamine  100 mg Intravenous Daily  . tiotropium  18 mcg Inhalation Daily    Assessment/Plan 1. Recurrent abdominal pain, and multiple fluid collections/prior duodenal perforation treated medically.  2. Hx of ETOH abuse and possible pancreatitis  3. Tobacco use/COPD  4. Hx of seizures on medication none since 2006  5. Hypertension  6. Chronic lower back pain with spinal cord stimulator implant 2012  Cervical surgery 2012 Dr Arnoldo Morale  7. Neuropathy/gait disorder/Complex regional pain syndrome (sedentary uses cane outside of home)  8. Deconditioning/malnutrition  9. Memory disorder 10. He can be on DVT prophylaxis from our standpoint.  Plan:  Continue drain and IV antibiotics, GI consult.    LOS: 1 day     Nell Schrack 08/02/2013

## 2013-08-02 NOTE — Progress Notes (Signed)
General surgery:  I have personally interviewed and examined this patient this morning. I agree with the assessment and treatment plan outlined by Mr. Creig Hines less intense, no longer sharp. He states that he ate regular food this morning and had no problem with that. PA.  Successful percutaneous drainage of right retroperitoneal abscess yesterday yielded purulent fluid. He states that his right upper quadrant and right flank pain persists but now is [place], less intense, and not sharp anymore. He enjoyed a regular breakfast this morning without difficulty. Had a bowel movement yesterday.  Exam abdomen is still somewhat tender and sore, more on the right than on the left. Drink fluid is cloudy but somewhat watery.Gram stain negative.   Assessment: We're treating him for recurrent abdominal pain, contained perforated duodenal ulcer, multiple fluid collections. Other comorbidities include out all abuse, possible pancreatitis, tobacco use, COPD, remote history seizures, hypertension, neuropathy with gait disorder, memory disorder, deconditioning and malnutrition.  Recommendations: Diet as tolerated Broad-spectrum antibiotics, to continued, Check cultures A double dose proton pump inhibitors Recommend gastroenterology consult. He will need a GI workup an endoscopy at some point because of the clinical diagnosis of contained perforated peptic ulcer. Katcher will need to be ruled out as well.  Edsel Petrin. Dalbert Batman, M.D., [facility] General and Minimally invasive Surgery Breast and Colorectal Surgery Office:   [phone] Pager:   [phone]

## 2013-08-02 NOTE — Consult Note (Addendum)
[facility] Consult Note  Referring Provider: No ref. provider found Primary Care Physician:  [name], MD Primary Gastroenterologist:  Dr.  Laurel Dimmer Complaint: Abdominal pain HPI: Jeffrey Castillo is an 63 y.o. white male  admitted yesterday with recurrent right lower quadrant abdominal pain. He had been admitted to the hospital earlier this month with CT of the abdomen showing retroperitoneal fluid collections with clinical diagnosis of perforated peptic ulcer. Surgery was consulted and a Gastrografin swallow showed no extravasation and no ulcer and he was treated conservatively with antibiotics and improved clinically and discharged on antibiotics as well as pantoprazole. He was doing well until he redeveloped right lower quadrant abdominal pain and was found to have recurrent retroperitoneal abscess as involving the pancreas and into the right paracolic gutter several loculated fluid collections were noted again in the upper mid and hemiabdomen just inferior to the stomach. Cholelithiasis was also seen. We're asked to see the patient for better assessment of the initial clinical diagnosis of perforated peptic ulcer.  Past Medical History  Diagnosis Date  . HYPERTENSION 07/31/2009  . Seizures   . Degenerative joint disease of low back   . Gait disorder   . Neuropathy   . Complex regional pain syndrome of left lower extremity   . Osteopenia   . Memory disorder   . Right humeral fracture   . Essential tremor   . Alcohol abuse     Past Surgical History  Procedure Laterality Date  . Foot surgery      2 occasions  . Shoulder surgery Right   . Vasectomy    . Bladder leision    . Bunionectomy    . Lumbar spine surgery      2 occasions for left L5 radiculopathy  . Cervical spine surgery  08/2010    C5-6-7  . Spinal cord stimulator implant  02/2011    Medications Prior to Admission  Medication Sig Dispense Refill  . ciprofloxacin (CIPRO) 500 MG tablet Take 1 tablet  (500 mg total) by mouth 2 (two) times daily.  14 tablet  0  . cloNIDine (CATAPRES - DOSED IN MG/24 HR) 0.2 mg/24hr patch Place 1 patch onto the skin once a week.      . lamoTRIgine (LAMICTAL) 25 MG tablet Take 1 tablet (25 mg total) by mouth daily. Take 25 mg (1 tablet) once daily x 2 weeks, then 91m (2 tablets) once daily x 2 weeks, then 572m(2 tablets) two times a day x 2 weeks.  120 tablet  1  . levETIRAcetam (KEPPRA) 750 MG tablet Take 1 tablet (750 mg total) by mouth 2 (two) times daily. Take 7502mose until you have titrated back to your original lamotrigine dose  60 tablet  0  . metroNIDAZOLE (FLAGYL) 500 MG tablet Take 1 tablet (500 mg total) by mouth every 8 (eight) hours.  21 tablet  0  . pantoprazole (PROTONIX) 40 MG tablet Take 1 tablet (40 mg total) by mouth 2 (two) times daily.  60 tablet  1    Allergies:  Allergies  Allergen Reactions  . Lyrica [Pregabalin]     Hallucinations.    . Sulfonamide Derivatives     REACTION: unknown as a child  . Valsartan     REACTION: lightheaded    Family History  Problem Relation Age of Onset  . Colon cancer Father   . Heart disease Brother     Social History:  reports that he has been smoking Cigarettes.  He  has been smoking about 1.00 pack per day. He has never used smokeless tobacco. He reports that he drinks alcohol. He reports that he does not use illicit drugs.  Review of Systems: negative except as above   Blood pressure 141/74, pulse 63, temperature 99.1 F (37.3 C), temperature source Oral, resp. rate 16, height 5' 7"  (1.702 m), weight 68.04 kg (150 lb), SpO2 96.00%. Head: Normocephalic, without obvious abnormality, atraumatic Neck: no adenopathy, no carotid bruit, no JVD, supple, symmetrical, trachea midline and thyroid not enlarged, symmetric, no tenderness/mass/nodules Resp: clear to auscultation bilaterally Cardio: regular rate and rhythm, S1, S2 normal, no murmur, click, rub or gallop GI: Abdomen soft moderately  tender throughout no abdominal mass or organomegaly palpable Extremities: extremities normal, atraumatic, no cyanosis or edema  Results for orders placed during the hospital encounter of 08/01/13 (from the past 48 hour(s))  CBC WITH DIFFERENTIAL     Status: Abnormal   Collection Time    08/01/13  2:13 AM      Result Value Range   WBC 12.0 (*) 4.0 - 10.5 K/uL   RBC 4.02 (*) 4.22 - 5.81 MIL/uL   Hemoglobin 14.4  13.0 - 17.0 g/dL   HCT 42.2  39.0 - 52.0 %   MCV 105.0 (*) 78.0 - 100.0 fL   MCH 35.8 (*) 26.0 - 34.0 pg   MCHC 34.1  30.0 - 36.0 g/dL   RDW 14.1  11.5 - 15.5 %   Platelets 312  150 - 400 K/uL   Neutrophils Relative % 66  43 - 77 %   Neutro Abs 7.9 (*) 1.7 - 7.7 K/uL   Lymphocytes Relative 23  12 - 46 %   Lymphs Abs 2.8  0.7 - 4.0 K/uL   Monocytes Relative 9  3 - 12 %   Monocytes Absolute 1.1 (*) 0.1 - 1.0 K/uL   Eosinophils Relative 2  0 - 5 %   Eosinophils Absolute 0.2  0.0 - 0.7 K/uL   Basophils Relative 0  0 - 1 %   Basophils Absolute 0.0  0.0 - 0.1 K/uL  COMPREHENSIVE METABOLIC PANEL     Status: Abnormal   Collection Time    08/01/13  2:13 AM      Result Value Range   Sodium 142  137 - 147 mEq/L   Potassium 3.9  3.7 - 5.3 mEq/L   Chloride 104  96 - 112 mEq/L   CO2 26  19 - 32 mEq/L   Glucose, Bld 111 (*) 70 - 99 mg/dL   BUN 7  6 - 23 mg/dL   Creatinine, Ser 0.72  0.50 - 1.35 mg/dL   Calcium 8.8  8.4 - 10.5 mg/dL   Total Protein 6.2  6.0 - 8.3 g/dL   Albumin 2.7 (*) 3.5 - 5.2 g/dL   AST 16  0 - 37 U/L   ALT 7  0 - 53 U/L   Alkaline Phosphatase 133 (*) 39 - 117 U/L   Total Bilirubin 0.5  0.3 - 1.2 mg/dL   GFR calc non Af Amer >90  >90 mL/min   GFR calc Af Amer >90  >90 mL/min   Comment: (NOTE)     The eGFR has been calculated using the CKD EPI equation.     This calculation has not been validated in all clinical situations.     eGFR's persistently <90 mL/min signify possible Chronic Kidney     Disease.  LIPASE, BLOOD     Status: None  Collection Time     08/01/13  2:13 AM      Result Value Range   Lipase 14  11 - 59 U/L  CG4 I-STAT (LACTIC ACID)     Status: None   Collection Time    08/01/13  2:22 AM      Result Value Range   Lactic Acid, Venous 1.60  0.5 - 2.2 mmol/L  URINALYSIS, ROUTINE W REFLEX MICROSCOPIC     Status: Abnormal   Collection Time    08/01/13  4:27 AM      Result Value Range   Color, Urine AMBER (*) YELLOW   Comment: BIOCHEMICALS MAY BE AFFECTED BY COLOR   APPearance CLEAR  CLEAR   Specific Gravity, Urine 1.025  1.005 - 1.030   pH 6.0  5.0 - 8.0   Glucose, UA NEGATIVE  NEGATIVE mg/dL   Hgb urine dipstick NEGATIVE  NEGATIVE   Bilirubin Urine NEGATIVE  NEGATIVE   Ketones, ur NEGATIVE  NEGATIVE mg/dL   Protein, ur NEGATIVE  NEGATIVE mg/dL   Urobilinogen, UA 0.2  0.0 - 1.0 mg/dL   Nitrite POSITIVE (*) NEGATIVE   Leukocytes, UA SMALL (*) NEGATIVE  URINE MICROSCOPIC-ADD ON     Status: None   Collection Time    08/01/13  4:27 AM      Result Value Range   Squamous Epithelial / LPF RARE  RARE   WBC, UA 0-2  <3 WBC/hpf  URINE CULTURE     Status: None   Collection Time    08/01/13  4:27 AM      Result Value Range   Specimen Description URINE, CLEAN CATCH     Special Requests NONE     Culture  Setup Time       Value: 08/01/2013 08:57     Performed at [facility]       Value: NO GROWTH     Performed at [facility]   Culture       Value: NO GROWTH     Performed at [facility]   Report Status 08/02/2013 FINAL    PROTIME-INR     Status: Abnormal   Collection Time    08/01/13  9:12 AM      Result Value Range   Prothrombin Time 16.5 (*) 11.6 - 15.2 seconds   INR 1.37  0.00 - 1.49  APTT     Status: Abnormal   Collection Time    08/01/13  9:12 AM      Result Value Range   aPTT 40 (*) 24 - 37 seconds   Comment:            IF BASELINE aPTT IS ELEVATED,     SUGGEST PATIENT RISK ASSESSMENT     BE USED TO DETERMINE APPROPRIATE     ANTICOAGULANT THERAPY.  CULTURE,  ROUTINE-ABSCESS     Status: None   Collection Time    08/01/13  1:15 PM      Result Value Range   Specimen Description OTHER RETROPERITONEAL ABSCESS RT FLANK     Special Requests NONE     Gram Stain       Value: NO WBC SEEN     NO SQUAMOUS EPITHELIAL CELLS SEEN     NO ORGANISMS SEEN     Performed at [facility]   Culture       Value: NO GROWTH 1 DAY     Performed at [facility]  Report Status PENDING    MAGNESIUM     Status: None   Collection Time    08/02/13  4:51 AM      Result Value Range   Magnesium 1.6  1.5 - 2.5 mg/dL  PHOSPHORUS     Status: None   Collection Time    08/02/13  4:51 AM      Result Value Range   Phosphorus 3.5  2.3 - 4.6 mg/dL  TSH     Status: None   Collection Time    08/02/13  4:51 AM      Result Value Range   TSH 4.042  0.350 - 4.500 uIU/mL   Comment: Performed at [facility]  CBC     Status: Abnormal   Collection Time    08/02/13  4:51 AM      Result Value Range   WBC 9.3  4.0 - 10.5 K/uL   RBC 3.54 (*) 4.22 - 5.81 MIL/uL   Hemoglobin 12.4 (*) 13.0 - 17.0 g/dL   HCT 37.4 (*) 39.0 - 52.0 %   MCV 105.6 (*) 78.0 - 100.0 fL   MCH 35.0 (*) 26.0 - 34.0 pg   MCHC 33.2  30.0 - 36.0 g/dL   RDW 14.1  11.5 - 15.5 %   Platelets 264  150 - 400 K/uL  COMPREHENSIVE METABOLIC PANEL     Status: Abnormal   Collection Time    08/02/13  4:51 AM      Result Value Range   Sodium 138  137 - 147 mEq/L   Potassium 3.8  3.7 - 5.3 mEq/L   Chloride 104  96 - 112 mEq/L   CO2 25  19 - 32 mEq/L   Glucose, Bld 101 (*) 70 - 99 mg/dL   BUN 6  6 - 23 mg/dL   Creatinine, Ser 0.69  0.50 - 1.35 mg/dL   Calcium 7.9 (*) 8.4 - 10.5 mg/dL   Total Protein 5.2 (*) 6.0 - 8.3 g/dL   Albumin 2.1 (*) 3.5 - 5.2 g/dL   AST 10  0 - 37 U/L   ALT <5  0 - 53 U/L   Alkaline Phosphatase 90  39 - 117 U/L   Total Bilirubin 0.4  0.3 - 1.2 mg/dL   GFR calc non Af Amer >90  >90 mL/min   GFR calc Af Amer >90  >90 mL/min   Comment: (NOTE)     The eGFR has been  calculated using the CKD EPI equation.     This calculation has not been validated in all clinical situations.     eGFR's persistently <90 mL/min signify possible Chronic Kidney     Disease.   Ct Abdomen Pelvis W Contrast  08/01/2013   CLINICAL DATA:  Back pain, radiating into right abdomen. Recent perforated gastric ulcer  EXAM: CT ABDOMEN AND PELVIS WITH CONTRAST  TECHNIQUE: Multidetector CT imaging of the abdomen and pelvis was performed using the standard protocol following bolus administration of intravenous contrast.  CONTRAST:  12m OMNIPAQUE IOHEXOL 300 MG/ML  SOLN  COMPARISON:  Prior CT from 07/17/2013.  FINDINGS: Small right pleural effusion with associated atelectasis is present. There is mild lingular and left basilar atelectasis as well.  Mild cirrhotic changes of the liver again noted. Small hepatic cysts noted at the hepatic dome, unchanged. Tiny hypodensity adjacent to the gallbladder fossa is too small the characterize by CT, also stable. Liver is otherwise unremarkable.  Layering hyperdensity is again seen within the gallbladder neck,  suggestive of stones and/or sludge. The spleen and adrenal glands are unremarkable.  Complex cystic mass measuring 2.0 x 2.1 cm is again seen within the pancreatic head/uncinate process (series 2, image 35), unchanged.  2.9 cm right renal cyst again noted. Smaller left renal cyst is unchanged as well. Kidneys are otherwise unremarkable without evidence of hydronephrosis or nephrolithiasis.  Discoid hypodense collection measuring 2.6 x 6.8 x 8.1 cm is again seen superior and posterior to the right kidney (series 5, image 31). Overall, this collection is slightly increased in size relative to prior exam. Complex inflammatory changes are seen extending inferiorly from this collection towards the right pericolic gutter. A second heterogeneous loculated collection measures approximately 2.9 x 3.7 x 5.5 cm (series 2, image 40). This collection abuts the posterior  aspect of the inferior pole the right kidney. There is inflammatory fat stranding within the adjacent right pericolic gutter and along the right pericolic fascia.  Several additional loculated fluid collection is are again seen within the upper mid and left hemi abdomen. Collection along the midline just inferior to the stomach measures 1.7 x 2.3 cm, similar to prior. Additional irregular collection located slightly more inferiorly measures 2.3 x 4.6 cm (series 2, image 42). These are more loculated well-formed as compared to the prior examination. Inflammatory nodularity is seen adjacent to this collection within the left hemi abdomen (series 2, image 44). A larger collection measuring approximately 7.4 x 3.3 cm is seen along the left lateral aspect of the greater curvature of the stomach (series 2, image 29).  An additional smaller collection is seen dependently within the right pericolic gutter, measuring 1.8 x 3.2 cm (series 2, image 59).  No evidence of bowel obstruction. Mild inflammatory fat stranding and wall thickening again seen about the distal portion of the duodenum, slightly improved. Sigmoid diverticulosis noted.  Bladder and prostate are within normal limits.  No free intraperitoneal air identified. A few mildly prominent mesenteric lymph nodes are noted scattered throughout the abdomen, likely reactive.  Prominent aorto bi-iliac atherosclerotic calcifications noted.  Spinal stimulator device again noted. Multilevel degenerative changes are stable. No acute osseous abnormality.  IMPRESSION: 1. Slight interval increase in size of discoid hypodense collection posterior to the right kidney extending inferiorly into the right pericolic gutter as above. Finding likely accounts for provided history of back and right hemi abdominal pain. 2. Additional multiple intra-abdominal fluid collections as detailed above. One of these collections located along the left lateral aspect of the stomach is new as compared  to the prior exam, and is positioned within a region of extensive inflammatory changes seen on prior study. Overall, the remaining collections are similar in size as compared to prior study, with overall improved inflammatory mesenteric fat stranding. No free intraperitoneal air identified. 3. Stable appearance of complex cystic mass within the head of the pancreas. Short interval follow-up in 3 months with CT and/or MRI is recommended. 4. Small right pleural effusion with associated atelectasis. 5. Cholelithiasis. 6. Sigmoid diverticulosis without acute diverticulitis. 7. Mild nodularity of the hepatic contour, suggesting cirrhosis.   Electronically Signed   By: Jeannine Boga M.D.   On: 08/01/2013 04:44   Ct Image Guided Drainage By Percutaneous Catheter  08/01/2013   CLINICAL DATA:  63 year old male with a history of recent perforated gastric ulcer. He has multifocal regions of abnormality throughout his abdomen and deep pelvis. The majority of the collections represent a combination of fat necrosis and inflammatory phlegmon. However, there is 1 peripherally enhancing centrally  low attenuation collection in the posterior pararenal fascia concerning for abscess. The patient is experienced continued pain, and fever despite palpation antibiotics. Percutaneous drainage is warranted.  EXAM: CT IMAGE GUIDED DRAINAGE BY PERCUTANEOUS CATHETER  Date: 08/01/2013  TECHNIQUE: Informed consent was obtained from the patient following explanation of the procedure, risks, benefits and alternatives. The patient understands, agrees and consents for the procedure. All questions were addressed. A time out was performed.  Maximal barrier sterile technique utilized including caps, mask, sterile gowns, sterile gloves, large sterile drape, hand hygiene, and Betadine skin prep.  A planning axial CT scan was performed. The loculated fluid collection along the right posterior para renal fascia was successfully identified. An  appropriate skin entry site was selected and marked. Local anesthesia was attained by infiltration with 1% lidocaine. A small dermatotomy was made. Using CT fluoroscopic guidance, an 18 gauge trocar needle was carefully advanced into the fluid collection. An Amplatz wire was advanced coaxially through the needle. The skin tract was then serially dilated to 12 [place]. A Cook 12 [place] multipurpose drain was then advanced over the wire and formed within the fluid collection. Approximately 60 mL of frankly purulent fluid was successfully aspirated. A sample was sent for culture and sensitivities. The catheter was then secured to the skin with 0 Prolene suture and bumper technique. A sterile bandage was placed. The patient tolerated the procedure well.  ANESTHESIA/SEDATION: Moderate (conscious) sedation was used. Two mg Versed, 100 mcg Fentanyl were administered intravenously. The patient's vital signs were monitored continuously by radiology nursing throughout the procedure.  Sedation Time: 25 minutes  PROCEDURE: 1. CT-guided drain placement Interventional Radiologist:  Criselda Peaches, MD  IMPRESSION: Successful placement of a 80 French percutaneous drainage catheter into the right posterior para renal fascia retroperitoneal fluid collection. Approximately 60 mL of purulent fluid was successfully aspirated. A sample was sent for culture and sensitivities.  Signed,  Criselda Peaches, MD  [facility]  [facility]   Electronically Signed   By: Jacqulynn Cadet M.D.   On: 08/01/2013 17:49    Assessment: Recurrent multiple intra-abdominal fluid collections with initial diagnosis clinically of perforated peptic ulcer treated conservatively initially, request a better assessment of diagnostic certainty of the initial diagnosis. Plan:  The patient has eaten a full breakfast today. Will begin workup with full upper GI series as well as small bowel series to rule out  small intestinal involvement in this complex process. This will be scheduled for tomorrow. Will follow with you. Agree with Protonix, antibiotics and drainage. Messi Twedt C 08/02/2013, 1:18 PM

## 2013-08-03 LAB — CBC
HCT: 35.2 % — ABNORMAL LOW (ref 39.0–52.0)
Hemoglobin: 11.6 g/dL — ABNORMAL LOW (ref 13.0–17.0)
MCH: 34.8 pg — ABNORMAL HIGH (ref 26.0–34.0)
MCHC: 33 g/dL (ref 30.0–36.0)
MCV: 105.7 fL — ABNORMAL HIGH (ref 78.0–100.0)
Platelets: 273 10*3/uL (ref 150–400)
RBC: 3.33 MIL/uL — ABNORMAL LOW (ref 4.22–5.81)
RDW: 14.1 % (ref 11.5–15.5)
WBC: 9.4 10*3/uL (ref 4.0–10.5)

## 2013-08-03 LAB — BASIC METABOLIC PANEL
BUN: 5 mg/dL — ABNORMAL LOW (ref 6–23)
CO2: 25 mEq/L (ref 19–32)
Calcium: 8 mg/dL — ABNORMAL LOW (ref 8.4–10.5)
Chloride: 104 mEq/L (ref 96–112)
Creatinine, Ser: 0.63 mg/dL (ref 0.50–1.35)
GFR calc Af Amer: >90 mL/min (ref >90)
GFR calc non Af Amer: >90 mL/min (ref >90)
Glucose, Bld: 112 mg/dL — ABNORMAL HIGH (ref 70–99)
Potassium: 3.6 mEq/L — ABNORMAL LOW (ref 3.7–5.3)
Sodium: 139 mEq/L (ref 137–147)

## 2013-08-03 MED ORDER — NICOTINE 21 MG/24HR TD PT24
21.0000 mg | MEDICATED_PATCH | Freq: Every day | TRANSDERMAL | Status: DC
  Administered 2013-08-03 – 2013-08-07 (×5): 21 mg via TRANSDERMAL
  Filled 2013-08-03 (×5): qty 1

## 2013-08-03 MED ORDER — POTASSIUM CHLORIDE CRYS ER 20 MEQ PO TBCR
40.0000 meq | EXTENDED_RELEASE_TABLET | Freq: Once | ORAL | Status: AC
  Administered 2013-08-03: 40 meq via ORAL
  Filled 2013-08-03: qty 2

## 2013-08-03 NOTE — Progress Notes (Signed)
Jeffrey Castillo 12:16 PM  Subjective: Patient doing better with the drain and no new medical problems or complaint and his case was discussed with my partner and radiology who does not feel any upper GI small bowel series will delineate the cause of his problem any further and they postulate whether this is all residual pancreatitis  Objective: Vital signs stable afebrile abdomen is actually soft nontender labs stable  Assessment: Intra-abdominal fluid collections questionable etiology  Plan: Will review his history in more detail and decide if a upper GI small bowel follow-through is our next test which radiology might be able to do on Monday if the contrast from the previous test has passed or if we should proceed with an endoscopy or possibly even an endoscopy and EUS  [facility]

## 2013-08-03 NOTE — Progress Notes (Signed)
Patient ID: Jeffrey Castillo, male   DOB: 07-12-1950, 63 y.o.   MRN: [id number]  [facility] PROGRESS NOTE  ADRIEN Castillo ERX:[id number] DOB: [birth year] DOA: 08/01/2013 PCP: Jeffrey Fennel, MD  Brief narrative:  63 y.o. Male with history of alcohol abuse, recent admission earlier this month with suspected perforated peptic ulcer but further studies demonstrating no ulcer and he was sent home on ABX and PPI, presented to [facility] ED with main concern of several weeks duration of recurrent and intermittent right lower quadrant abdominal pain, 7/10 in severity when present and non radiating, no specific alleviating factors, worse with eating, associated with nausea, chills, night sweats. Repeat CT abd in ED showed recurrent retroperitoneal abscess involving the pancreas and several loculated fluid collections in the upper mid and hemiabdomen just inferior to the stomach. [facility] asked to admit for further evaluation. IR consulted for assistance with abscess drain, surgery and GI consulted as well for further assistance.   Active Problems:  Intra-abdominal abscess  - s/p placement of percutaneous drainage catheter into the right posterior para renal fascia retroperitoneal fluid collection  - Approximately 60 mL of purulent fluid was successfully aspirated and sample was sent for culture and sensitivities.  - pt reports felling well this AM, slight discomfort  - continue Ertapenem day #3, supportive care with analgesia as needed  C O P D  - this appears to be clinically stable, pt maintaining oxygen saturation at target range  Generalized convulsive epilepsy without mention of intractable epilepsy  - stable inpatient, continue keppra  HTN  - reasonable inpatient control  Suspected Duodenal ulcer perforation  - appreciate GI input  - plan to proceed with small bowel series to rule out small intestine involvement  - continue Protonix, antibiotics, drainage  Hypokalemia - will supplement and repeat BMP  in AM Leukocytosis  - secondary to abscess as noted above, continue Ertapenem day #2  - WBC trending down and is WNL this AM  - CBC in AM   Consultants:  GI  Surgery  IR Procedures/Studies:  Ct Abdomen Pelvis W Contrast 08/01/2013  1. Slight interval increase in size of discoid hypodense collection posterior to the right kidney extending inferiorly into the right pericolic gutter as above. Finding likely accounts for provided history of back and right hemi abdominal pain.  2. Additional multiple intra-abdominal fluid collections as detailed above. One of these collections located along the left lateral aspect of the stomach is new as compared to the prior exam, and is positioned within a region of extensive inflammatory changes seen on prior study. Overall, the remaining collections are similar in size as compared to prior study, with overall improved inflammatory mesenteric fat stranding. No free intraperitoneal air identified.  3. Stable appearance of complex cystic mass within the head of the pancreas. Short interval follow-up in 3 months with CT and/or MRI is recommended.  4. Small right pleural effusion with associated atelectasis.  5. Cholelithiasis.  6. Sigmoid diverticulosis without acute diverticulitis.  7. Mild nodularity of the hepatic contour, suggesting cirrhosis.  Ct Image Guided Drainage By Percutaneous Catheter  08/01/2013 Successful placement of a 12 French percutaneous drainage catheter into the right posterior para renal fascia retroperitoneal fluid collection. Approximately 60 mL of purulent fluid was successfully aspirated. A sample was sent for culture and sensitivities.  Antibiotics:  Ertapenem 01/22 -->  Code Status: Full  Family Communication: Pt at bedside  Disposition Plan: Home when medically stable  HPI/Subjective: No events overnight.   Objective: Filed  Vitals:   08/02/13 1438 08/02/13 2220 08/03/13 0530 08/03/13 0820  BP: 122/69 144/67 139/70   Pulse:  60 62 65   Temp: 97.9 F (36.6 C) 98.5 F (36.9 C) 97.9 F (36.6 C)   TempSrc: Oral Oral Oral   Resp: 18 16 16    Height:      Weight:      SpO2: 94% 93% 96% 88%    Intake/Output Summary (Last 24 hours) at 08/03/13 1116 Last data filed at 08/03/13 1038  Gross per 24 hour  Intake    357 ml  Output   1335 ml  Net   -978 ml    Exam:   General:  Pt is alert, follows commands appropriately, not in acute distress  Cardiovascular: Regular rate and rhythm, S1/S2, no murmurs, no rubs, no gallops  Respiratory: Clear to auscultation bilaterally, no wheezing, no crackles, no rhonchi  Abdomen: Soft, non tender, non distended, bowel sounds present, no guarding  Extremities: No edema, pulses DP and PT palpable bilaterally  Neuro: Grossly nonfocal  Data Reviewed: Basic Metabolic Panel:  Recent Labs Lab 08/01/13 0213 08/02/13 0451 08/03/13 0429  NA 142 138 139  K 3.9 3.8 3.6*  CL 104 104 104  CO2 26 25 25   GLUCOSE 111* 101* 112*  BUN 7 6 5*  CREATININE 0.72 0.69 0.63  CALCIUM 8.8 7.9* 8.0*  MG  --  1.6  --   PHOS  --  3.5  --    Liver Function Tests:  Recent Labs Lab 08/01/13 0213 08/02/13 0451  AST 16 10  ALT 7 <5  ALKPHOS 133* 90  BILITOT 0.5 0.4  PROT 6.2 5.2*  ALBUMIN 2.7* 2.1*    Recent Labs Lab 08/01/13 0213  LIPASE 14   CBC:  Recent Labs Lab 08/01/13 0213 08/02/13 0451 08/03/13 0429  WBC 12.0* 9.3 9.4  NEUTROABS 7.9*  --   --   HGB 14.4 12.4* 11.6*  HCT 42.2 37.4* 35.2*  MCV 105.0* 105.6* 105.7*  PLT 312 264 273    Recent Results (from the past 240 hour(s))  URINE CULTURE     Status: None   Collection Time    08/01/13  4:27 AM      Result Value Range Status   Specimen Description URINE, CLEAN CATCH   Final   Special Requests NONE   Final   Culture  Setup Time     Final   Value: 08/01/2013 08:57     Performed at [facility]     Final   Value: NO GROWTH     Performed at [facility]   Culture      Final   Value: NO GROWTH     Performed at [facility]   Report Status 08/02/2013 FINAL   Final  CULTURE, ROUTINE-ABSCESS     Status: None   Collection Time    08/01/13  1:15 PM      Result Value Range Status   Specimen Description OTHER RETROPERITONEAL ABSCESS RT FLANK   Final   Special Requests NONE   Final   Gram Stain     Final   Value: NO WBC SEEN     NO SQUAMOUS EPITHELIAL CELLS SEEN     NO ORGANISMS SEEN     Performed at [facility]   Culture     Final   Value: NO GROWTH 2 DAYS     Performed at [facility]  Report Status PENDING   Incomplete     Scheduled Meds: . cloNIDine  0.2 mg Transdermal Weekly  . docusate sodium  100 mg Oral BID  . ertapenem  1 g Intravenous Q24H  . folic acid  1 mg Intravenous Daily  . levETIRAcetam  750 mg Intravenous BID  . pantoprazole (PROTONIX) IV  40 mg Intravenous Q12H  . sodium chloride  3 mL Intravenous Q12H  . thiamine  100 mg Intravenous Daily  . tiotropium  18 mcg Inhalation Daily   Continuous Infusions:  Faye Ramsay, MD  [facility] Pager [phone]  If 7PM-7AM, please contact night-coverage [email or web address] Password [email or web address] 08/03/2013, 11:16 AM   LOS: 2 days

## 2013-08-03 NOTE — Progress Notes (Signed)
Subjective: Still sore at drain site.  Feeling better overall.  Objective: Vital signs in last 24 hours: Temp:  [97.9 F (36.6 C)-98.5 F (36.9 C)] 97.9 F (36.6 C) (01/24 0530) Pulse Rate:  [60-65] 65 (01/24 0530) Resp:  [16-18] 16 (01/24 0530) BP: (122-144)/(67-70) 139/70 mmHg (01/24 0530) SpO2:  [88 %-96 %] 88 % (01/24 0820) Last BM Date: 08/02/13 60 ml of purulent fluid drained yesterday by IR  Intake/Output from previous day: 01/23 0701 - 01/24 0700 In: 26 [P.O.:360; IV Piggyback:107] Out: 925 [Urine:910; Drains:15] Intake/Output this shift: Total I/O In: -  Out: 300 [Urine:300]  General appearance: alert, cooperative and no distress GI: soft, still sore and tender to palpation, worst is posterior drain site,.  Drain fluid is serous colored fluid.  Lab Results:   Recent Labs  08/02/13 0451 08/03/13 0429  WBC 9.3 9.4  HGB 12.4* 11.6*  HCT 37.4* 35.2*  PLT 264 273    BMET  Recent Labs  08/02/13 0451 08/03/13 0429  NA 138 139  K 3.8 3.6*  CL 104 104  CO2 25 25  GLUCOSE 101* 112*  BUN 6 5*  CREATININE 0.69 0.63  CALCIUM 7.9* 8.0*   PT/INR  Recent Labs  08/01/13 0912  LABPROT 16.5*  INR 1.37     Recent Labs Lab 08/01/13 0213 08/02/13 0451  AST 16 10  ALT 7 <5  ALKPHOS 133* 90  BILITOT 0.5 0.4  PROT 6.2 5.2*  ALBUMIN 2.7* 2.1*     Lipase     Component Value Date/Time   LIPASE 14 08/01/2013 0213     Studies/Results: Ct Image Guided Drainage By Percutaneous Catheter  08/01/2013   CLINICAL DATA:  63 year old male with a history of recent perforated gastric ulcer. He has multifocal regions of abnormality throughout his abdomen and deep pelvis. The majority of the collections represent a combination of fat necrosis and inflammatory phlegmon. However, there is 1 peripherally enhancing centrally low attenuation collection in the posterior pararenal fascia concerning for abscess. The patient is experienced continued pain, and fever  despite palpation antibiotics. Percutaneous drainage is warranted.  EXAM: CT IMAGE GUIDED DRAINAGE BY PERCUTANEOUS CATHETER  Date: 08/01/2013  TECHNIQUE: Informed consent was obtained from the patient following explanation of the procedure, risks, benefits and alternatives. The patient understands, agrees and consents for the procedure. All questions were addressed. A time out was performed.  Maximal barrier sterile technique utilized including caps, mask, sterile gowns, sterile gloves, large sterile drape, hand hygiene, and Betadine skin prep.  A planning axial CT scan was performed. The loculated fluid collection along the right posterior para renal fascia was successfully identified. An appropriate skin entry site was selected and marked. Local anesthesia was attained by infiltration with 1% lidocaine. A small dermatotomy was made. Using CT fluoroscopic guidance, an 18 gauge trocar needle was carefully advanced into the fluid collection. An Amplatz wire was advanced coaxially through the needle. The skin tract was then serially dilated to 12 [place]. A Cook 12 [place] multipurpose drain was then advanced over the wire and formed within the fluid collection. Approximately 60 mL of frankly purulent fluid was successfully aspirated. A sample was sent for culture and sensitivities. The catheter was then secured to the skin with 0 Prolene suture and bumper technique. A sterile bandage was placed. The patient tolerated the procedure well.  ANESTHESIA/SEDATION: Moderate (conscious) sedation was used. Two mg Versed, 100 mcg Fentanyl were administered intravenously. The patient's vital signs were monitored continuously by radiology nursing throughout  the procedure.  Sedation Time: 25 minutes  PROCEDURE: 1. CT-guided drain placement Interventional Radiologist:  Criselda Peaches, MD  IMPRESSION: Successful placement of a 62 French percutaneous drainage catheter into the right posterior para renal fascia retroperitoneal  fluid collection. Approximately 60 mL of purulent fluid was successfully aspirated. A sample was sent for culture and sensitivities.  Signed,  Criselda Peaches, MD  [facility]  [facility]   Electronically Signed   By: Jacqulynn Cadet M.D.   On: 08/01/2013 17:49    Medications: . cloNIDine  0.2 mg Transdermal Weekly  . docusate sodium  100 mg Oral BID  . ertapenem  1 g Intravenous Q24H  . folic acid  1 mg Intravenous Daily  . levETIRAcetam  750 mg Intravenous BID  . pantoprazole (PROTONIX) IV  40 mg Intravenous Q12H  . sodium chloride  3 mL Intravenous Q12H  . thiamine  100 mg Intravenous Daily  . tiotropium  18 mcg Inhalation Daily    Assessment/Plan 1. Recurrent abdominal pain, and multiple fluid collections/prior duodenal perforation treated medically.  2. Hx of ETOH abuse and possible pancreatitis  3. Tobacco use/COPD  4. Hx of seizures on medication none since 2006  5. Hypertension  6. Chronic lower back pain with spinal cord stimulator implant 2012  Cervical surgery 2012 Dr Arnoldo Morale  7. Neuropathy/gait disorder/Complex regional pain syndrome (sedentary uses cane outside of home)  8. Deconditioning/malnutrition  9. Memory disorder 10. He can be on DVT prophylaxis from our standpoint.  Plan:  Continue drain and IV antibiotics.  Further work up per GI.  Will need EGD once area heals.        LOS: 2 days    Breslin Burklow C. [id number]/92/4268

## 2013-08-03 NOTE — Progress Notes (Signed)
Subjective: Pt resting in bed. Feeling some better since drain   Objective: Physical Exam: BP 139/70  Pulse 65  Temp(Src) 97.9 F (36.6 C) (Oral)  Resp 16  Ht 5\' 7"  (1.702 m)  Wt 150 lb (68.04 kg)  BMI 23.49 kg/m2  SpO2 96% (R)RP drain intact, output thinner yellow fluid, less purulent 15cc output   Labs: CBC  Recent Labs  08/02/13 0451 08/03/13 0429  WBC 9.3 9.4  HGB 12.4* 11.6*  HCT 37.4* 35.2*  PLT 264 273   BMET  Recent Labs  08/02/13 0451 08/03/13 0429  NA 138 139  K 3.8 3.6*  CL 104 104  CO2 25 25  GLUCOSE 101* 112*  BUN 6 5*  CREATININE 0.69 0.63  CALCIUM 7.9* 8.0*   LFT  Recent Labs  08/01/13 0213 08/02/13 0451  PROT 6.2 5.2*  ALBUMIN 2.7* 2.1*  AST 16 10  ALT 7 <5  ALKPHOS 133* 90  BILITOT 0.5 0.4  LIPASE 14  --    PT/INR  Recent Labs  08/01/13 0912  LABPROT 16.5*  INR 1.37     Studies/Results: Ct Image Guided Drainage By Percutaneous Catheter  08/01/2013   CLINICAL DATA:  63 year old male with a history of recent perforated gastric ulcer. He has multifocal regions of abnormality throughout his abdomen and deep pelvis. The majority of the collections represent a combination of fat necrosis and inflammatory phlegmon. However, there is 1 peripherally enhancing centrally low attenuation collection in the posterior pararenal fascia concerning for abscess. The patient is experienced continued pain, and fever despite palpation antibiotics. Percutaneous drainage is warranted.  EXAM: CT IMAGE GUIDED DRAINAGE BY PERCUTANEOUS CATHETER  Date: 08/01/2013  TECHNIQUE: Informed consent was obtained from the patient following explanation of the procedure, risks, benefits and alternatives. The patient understands, agrees and consents for the procedure. All questions were addressed. A time out was performed.  Maximal barrier sterile technique utilized including caps, mask, sterile gowns, sterile gloves, large sterile drape, hand hygiene, and Betadine  skin prep.  A planning axial CT scan was performed. The loculated fluid collection along the right posterior para renal fascia was successfully identified. An appropriate skin entry site was selected and marked. Local anesthesia was attained by infiltration with 1% lidocaine. A small dermatotomy was made. Using CT fluoroscopic guidance, an 18 gauge trocar needle was carefully advanced into the fluid collection. An Amplatz wire was advanced coaxially through the needle. The skin tract was then serially dilated to 12 [place]. A Cook 12 [place] multipurpose drain was then advanced over the wire and formed within the fluid collection. Approximately 60 mL of frankly purulent fluid was successfully aspirated. A sample was sent for culture and sensitivities. The catheter was then secured to the skin with 0 Prolene suture and bumper technique. A sterile bandage was placed. The patient tolerated the procedure well.  ANESTHESIA/SEDATION: Moderate (conscious) sedation was used. Two mg Versed, 100 mcg Fentanyl were administered intravenously. The patient's vital signs were monitored continuously by radiology nursing throughout the procedure.  Sedation Time: 25 minutes  PROCEDURE: 1. CT-guided drain placement Interventional Radiologist:  Criselda Peaches, MD  IMPRESSION: Successful placement of a 75 French percutaneous drainage catheter into the right posterior para renal fascia retroperitoneal fluid collection. Approximately 60 mL of purulent fluid was successfully aspirated. A sample was sent for culture and sensitivities.  Signed,  Criselda Peaches, MD  [facility]  [facility]   Electronically Signed   By: Dellis Filbert.D.  On: 08/01/2013 17:49    Assessment/Plan: (R)RP abscess s/p perc drain Cont flushes Cx pending   LOS: 2 days    Jeffrey Dike PA-C 08/03/2013 8:07 AM

## 2013-08-04 LAB — CBC
HCT: 35.9 % — ABNORMAL LOW (ref 39.0–52.0)
Hemoglobin: 11.9 g/dL — ABNORMAL LOW (ref 13.0–17.0)
MCH: 34.4 pg — ABNORMAL HIGH (ref 26.0–34.0)
MCHC: 33.1 g/dL (ref 30.0–36.0)
MCV: 103.8 fL — ABNORMAL HIGH (ref 78.0–100.0)
Platelets: 310 10*3/uL (ref 150–400)
RBC: 3.46 MIL/uL — ABNORMAL LOW (ref 4.22–5.81)
RDW: 13.9 % (ref 11.5–15.5)
WBC: 7.5 10*3/uL (ref 4.0–10.5)

## 2013-08-04 LAB — URINALYSIS, ROUTINE W REFLEX MICROSCOPIC
Bilirubin Urine: NEGATIVE
Glucose, UA: NEGATIVE mg/dL
Hgb urine dipstick: NEGATIVE
Ketones, ur: NEGATIVE mg/dL
Leukocytes, UA: NEGATIVE
Nitrite: NEGATIVE
Protein, ur: NEGATIVE mg/dL
Specific Gravity, Urine: 1.012 (ref 1.005–1.030)
Urobilinogen, UA: 0.2 mg/dL (ref 0.0–1.0)
pH: 7.5 (ref 5.0–8.0)

## 2013-08-04 LAB — BASIC METABOLIC PANEL
BUN: 4 mg/dL — ABNORMAL LOW (ref 6–23)
CO2: 26 mEq/L (ref 19–32)
Calcium: 8.4 mg/dL (ref 8.4–10.5)
Chloride: 104 mEq/L (ref 96–112)
Creatinine, Ser: 0.64 mg/dL (ref 0.50–1.35)
GFR calc Af Amer: >90 mL/min (ref >90)
GFR calc non Af Amer: >90 mL/min (ref >90)
Glucose, Bld: 96 mg/dL (ref 70–99)
Potassium: 3.8 mEq/L (ref 3.7–5.3)
Sodium: 140 mEq/L (ref 137–147)

## 2013-08-04 NOTE — Progress Notes (Addendum)
  Subjective: Feeling better  Objective: Vital signs in last 24 hours: Temp:  [98.1 F (36.7 C)-98.7 F (37.1 C)] 98.7 F (37.1 C) (01/25 0446) Pulse Rate:  [55-76] 55 (01/25 0446) Resp:  [17-18] 18 (01/25 0446) BP: (129-132)/(64-69) 132/69 mmHg (01/25 0446) SpO2:  [88 %-95 %] 93 % (01/25 0446) Last BM Date: 08/02/13   Intake/Output from previous day: 01/24 0701 - 01/25 0700 In: 657.5 [P.O.:440; IV Piggyback:207.5] Out: 1612.5 [Urine:1600; Drains:12.5] Intake/Output this shift: Total I/O In: -  Out: 275 [Urine:275]  General appearance: alert, cooperative and no distress GI: soft, still sore and tender to palpation, worst is posterior drain site,.   Drain fluid is serous colored.   Lab Results:   Recent Labs  08/03/13 0429 08/04/13 0446  WBC 9.4 7.5  HGB 11.6* 11.9*  HCT 35.2* 35.9*  PLT 273 310    BMET  Recent Labs  08/03/13 0429 08/04/13 0446  NA 139 140  K 3.6* 3.8  CL 104 104  CO2 25 26  GLUCOSE 112* 96  BUN 5* 4*  CREATININE 0.63 0.64  CALCIUM 8.0* 8.4   PT/INR  Recent Labs  08/01/13 0912  LABPROT 16.5*  INR 1.37     Recent Labs Lab 08/01/13 0213 08/02/13 0451  AST 16 10  ALT 7 <5  ALKPHOS 133* 90  BILITOT 0.5 0.4  PROT 6.2 5.2*  ALBUMIN 2.7* 2.1*     Lipase     Component Value Date/Time   LIPASE 14 08/01/2013 0213     Studies/Results: No results found.  Medications: . cloNIDine  0.2 mg Transdermal Weekly  . docusate sodium  100 mg Oral BID  . ertapenem  1 g Intravenous Q24H  . folic acid  1 mg Intravenous Daily  . levETIRAcetam  750 mg Intravenous BID  . nicotine  21 mg Transdermal Daily  . pantoprazole (PROTONIX) IV  40 mg Intravenous Q12H  . sodium chloride  3 mL Intravenous Q12H  . thiamine  100 mg Intravenous Daily  . tiotropium  18 mcg Inhalation Daily    Assessment/Plan 1. Recurrent abdominal pain, and multiple fluid collections/prior duodenal perforation treated medically.  2. Hx of ETOH abuse and  possible pancreatitis  3. Tobacco use/COPD  4. Hx of seizures on medication none since 2006  5. Hypertension  6. Chronic lower back pain with spinal cord stimulator implant 2012  Cervical surgery 2012 Dr Arnoldo Morale  7. Neuropathy/gait disorder/Complex regional pain syndrome (sedentary uses cane outside of home)  8. Deconditioning/malnutrition  9. Memory disorder 10. He can be on DVT prophylaxis from our standpoint.  Plan:  Continue drain and IV antibiotics.  Further work up per GI.  Will need EGD once area heals.  No acute surgical issues identified.  Will sign off.  Please call with any questions or concerns    Addendum: Pt c/o burning with urination.  Will check UA    LOS: 3 days    Fillmore Bynum C. 7/78/2423

## 2013-08-04 NOTE — Progress Notes (Signed)
Patient ID: Jeffrey Castillo, male   DOB: [birth year], 63 y.o.   MRN: [id number] [facility] PROGRESS NOTE  DANIYEL GOODIE [id number] DOB: 01/07/1951 DOA: 08/01/2013 PCP: [name], MD   Brief narrative:  63 y.o. Male with history of alcohol abuse, recent admission earlier this month with suspected perforated peptic ulcer but further studies demonstrating no ulcer and he was sent home on ABX and PPI, presented to [facility] ED with main concern of several weeks duration of recurrent and intermittent right lower quadrant abdominal pain, 7/10 in severity when present and non radiating, no specific alleviating factors, worse with eating, associated with nausea, chills, night sweats. Repeat CT abd in ED showed recurrent retroperitoneal abscess involving the pancreas and several loculated fluid collections in the upper mid and hemiabdomen just inferior to the stomach. [facility] asked to admit for further evaluation. IR consulted for assistance with abscess drain, surgery and GI consulted as well for further assistance.   Active Problems:  Intra-abdominal abscess  - s/p placement of percutaneous drainage catheter into the right posterior para renal fascia retroperitoneal fluid collection 08/01/2012 - Approximately 60 mL of purulent fluid was successfully aspirated and sample was sent for culture and sensitivities.  - pt reports felling well this AM, less discomfort  - continue Ertapenem day #4, supportive care with analgesia as needed  Dysuria - this am, will obtain UA and urine culture - pt is afebrile and WBC is within normal limits this AM C O P D  - this appears to be clinically stable, pt maintaining oxygen saturation at target range  Generalized convulsive epilepsy without mention of intractable epilepsy  - stable inpatient, continue keppra  HTN  - reasonable inpatient control  Suspected Duodenal ulcer perforation  - appreciate GI input  - plan to proceed with small bowel series to rule out  small intestine involvement  - continue Protonix, antibiotics, drainage  Hypokalemia  - supplemented and WNL this AM  Leukocytosis  - secondary to abscess as noted above, continue Ertapenem day #2  - WBC trending down and is WNL this AM  - CBC in AM   Consultants:  GI  Surgery  IR Procedures/Studies:  Ct Abdomen Pelvis W Contrast 08/01/2013  1. Slight interval increase in size of discoid hypodense collection posterior to the right kidney extending inferiorly into the right pericolic gutter as above. Finding likely accounts for provided history of back and right hemi abdominal pain.  2. Additional multiple intra-abdominal fluid collections as detailed above. One of these collections located along the left lateral aspect of the stomach is new as compared to the prior exam, and is positioned within a region of extensive inflammatory changes seen on prior study. Overall, the remaining collections are similar in size as compared to prior study, with overall improved inflammatory mesenteric fat stranding. No free intraperitoneal air identified.  3. Stable appearance of complex cystic mass within the head of the pancreas. Short interval follow-up in 3 months with CT and/or MRI is recommended.  4. Small right pleural effusion with associated atelectasis.  5. Cholelithiasis.  6. Sigmoid diverticulosis without acute diverticulitis.  7. Mild nodularity of the hepatic contour, suggesting cirrhosis.  Ct Image Guided Drainage By Percutaneous Catheter  08/01/2013 Successful placement of a 12 French percutaneous drainage catheter into the right posterior para renal fascia retroperitoneal fluid collection. Approximately 60 mL of purulent fluid was successfully aspirated. A sample was sent for culture and sensitivities.  Antibiotics:  Ertapenem 01/22 -->  Code Status: Full  Family Communication: Pt at bedside  Disposition Plan: Home when medically stable   HPI/Subjective: No events overnight.    Objective: Filed Vitals:   08/03/13 1250 08/03/13 2042 08/04/13 0446 08/04/13 0831  BP: 129/64 130/67 132/69   Pulse: 76 71 55   Temp: 98.1 F (36.7 C) 98.5 F (36.9 C) 98.7 F (37.1 C)   TempSrc: Oral Oral Oral   Resp: 17 18 18    Height:      Weight:      SpO2: 94% 95% 93% 87%    Intake/Output Summary (Last 24 hours) at 08/04/13 1134 Last data filed at 08/04/13 0825  Gross per 24 hour  Intake  897.5 ml  Output 1237.5 ml  Net   -340 ml    Exam:   General:  Pt is alert, follows commands appropriately, not in acute distress  Cardiovascular: Regular rate and rhythm, S1/S2, no murmurs, no rubs, no gallops  Respiratory: Clear to auscultation bilaterally, no wheezing, no crackles, no rhonchi  Abdomen: Soft, non tender, non distended, bowel sounds present, no guarding  Extremities: No edema, pulses DP and PT palpable bilaterally  Neuro: Grossly nonfocal  Data Reviewed: Basic Metabolic Panel:  Recent Labs Lab 08/01/13 0213 08/02/13 0451 08/03/13 0429 08/04/13 0446  NA 142 138 139 140  K 3.9 3.8 3.6* 3.8  CL 104 104 104 104  CO2 26 25 25 26   GLUCOSE 111* 101* 112* 96  BUN 7 6 5* 4*  CREATININE 0.72 0.69 0.63 0.64  CALCIUM 8.8 7.9* 8.0* 8.4  MG  --  1.6  --   --   PHOS  --  3.5  --   --    Liver Function Tests:  Recent Labs Lab 08/01/13 0213 08/02/13 0451  AST 16 10  ALT 7 <5  ALKPHOS 133* 90  BILITOT 0.5 0.4  PROT 6.2 5.2*  ALBUMIN 2.7* 2.1*    Recent Labs Lab 08/01/13 0213  LIPASE 14   CBC:  Recent Labs Lab 08/01/13 0213 08/02/13 0451 08/03/13 0429 08/04/13 0446  WBC 12.0* 9.3 9.4 7.5  NEUTROABS 7.9*  --   --   --   HGB 14.4 12.4* 11.6* 11.9*  HCT 42.2 37.4* 35.2* 35.9*  MCV 105.0* 105.6* 105.7* 103.8*  PLT 312 264 273 310   Recent Results (from the past 240 hour(s))  URINE CULTURE     Status: None   Collection Time    08/01/13  4:27 AM      Result Value Range Status   Specimen Description URINE, CLEAN CATCH   Final    Special Requests NONE   Final   Culture  Setup Time     Final   Value: 08/01/2013 08:57     Performed at [place]     Final   Value: NO GROWTH     Performed at [facility]   Culture     Final   Value: NO GROWTH     Performed at [facility]   Report Status 08/02/2013 FINAL   Final  CULTURE, ROUTINE-ABSCESS     Status: None   Collection Time    08/01/13  1:15 PM      Result Value Range Status   Specimen Description OTHER RETROPERITONEAL ABSCESS RT FLANK   Final   Special Requests NONE   Final   Gram Stain     Final   Value: NO WBC SEEN     NO SQUAMOUS EPITHELIAL CELLS  SEEN     NO ORGANISMS SEEN     Performed at [facility]   Culture     Final   Value: NO GROWTH 3 DAYS     Performed at [facility]   Report Status PENDING   Incomplete     Scheduled Meds: . cloNIDine  0.2 mg Transdermal Weekly  . docusate sodium  100 mg Oral BID  . ertapenem  1 g Intravenous Q24H  . folic acid  1 mg Intravenous Daily  . levETIRAcetam  750 mg Intravenous BID  . nicotine  21 mg Transdermal Daily  . pantoprazole (PROTONIX) IV  40 mg Intravenous Q12H  . sodium chloride  3 mL Intravenous Q12H  . thiamine  100 mg Intravenous Daily  . tiotropium  18 mcg Inhalation Daily   Continuous Infusions:  Faye Ramsay, MD  [facility] Pager [phone]  If 7PM-7AM, please contact night-coverage [email or web address] Password [facility] 08/04/2013, 11:34 AM   LOS: 3 days

## 2013-08-04 NOTE — Progress Notes (Signed)
Jeffrey Castillo 11:44 AM  Subjective: Patient is doing fine without any complaint  Objective: Vital signs stable afebrile abdomen is soft nontender no acute distress labs stable  Assessment: Questionable perforated ulcer versus pancreatitis see yesterday's note for details of discussion with radiology  Plan: After my discussion with radiology and how they do not feel an upper GI small bowel follow-through will add any thing I think proceeding with an endoscopy to rule out ulcers is how we should proceed and I discussed that with the patient including the risks benefits and methods and compared it to the colonoscopy he's had and will proceed tomorrow morning with further workup and plans pending those findings  [facility]

## 2013-08-05 ENCOUNTER — Ambulatory Visit: Payer: 59 | Admitting: Nurse Practitioner

## 2013-08-05 ENCOUNTER — Encounter (HOSPITAL_COMMUNITY)
Admission: EM | Disposition: A | Discharge status: Home / Self Care | Payer: Self-pay | Source: Home / Self Care | Attending: Internal Medicine

## 2013-08-05 ENCOUNTER — Encounter (HOSPITAL_COMMUNITY): Payer: Self-pay | Admitting: *Deleted

## 2013-08-05 DIAGNOSIS — T8859XA Other complications of anesthesia, initial encounter: Secondary | ICD-10-CM

## 2013-08-05 HISTORY — PX: ESOPHAGOGASTRODUODENOSCOPY: SHX5428

## 2013-08-05 HISTORY — DX: Other complications of anesthesia, initial encounter: T88.59XA

## 2013-08-05 LAB — BASIC METABOLIC PANEL
BUN: 4 mg/dL — ABNORMAL LOW (ref 6–23)
CO2: 25 mEq/L (ref 19–32)
Calcium: 8.5 mg/dL (ref 8.4–10.5)
Chloride: 101 mEq/L (ref 96–112)
Creatinine, Ser: 0.62 mg/dL (ref 0.50–1.35)
GFR calc Af Amer: >90 mL/min (ref >90)
GFR calc non Af Amer: >90 mL/min (ref >90)
Glucose, Bld: 111 mg/dL — ABNORMAL HIGH (ref 70–99)
Potassium: 3.7 mEq/L (ref 3.7–5.3)
Sodium: 138 mEq/L (ref 137–147)

## 2013-08-05 LAB — URINE CULTURE
Colony Count: NO GROWTH
Culture: NO GROWTH

## 2013-08-05 LAB — CULTURE, ROUTINE-ABSCESS
Culture: NO GROWTH
Gram Stain: NONE SEEN

## 2013-08-05 SURGERY — EGD (ESOPHAGOGASTRODUODENOSCOPY)
Anesthesia: Moderate Sedation

## 2013-08-05 MED ORDER — MIDAZOLAM HCL 10 MG/2ML IJ SOLN
INTRAMUSCULAR | Status: DC | PRN
  Administered 2013-08-05: 2 mg via INTRAVENOUS
  Administered 2013-08-05: 1 mg via INTRAVENOUS

## 2013-08-05 MED ORDER — FENTANYL CITRATE 0.05 MG/ML IJ SOLN
INTRAMUSCULAR | Status: DC | PRN
  Administered 2013-08-05: 25 ug via INTRAVENOUS

## 2013-08-05 MED ORDER — SODIUM CHLORIDE 0.9 % IV SOLN
750.0000 mg | Freq: Two times a day (BID) | INTRAVENOUS | Status: DC
  Administered 2013-08-05 – 2013-08-06 (×2): 750 mg via INTRAVENOUS
  Filled 2013-08-05 (×3): qty 7.5

## 2013-08-05 MED ORDER — PANTOPRAZOLE SODIUM 40 MG PO TBEC
40.0000 mg | DELAYED_RELEASE_TABLET | Freq: Every day | ORAL | Status: DC
  Administered 2013-08-05 – 2013-08-07 (×3): 40 mg via ORAL
  Filled 2013-08-05 (×3): qty 1

## 2013-08-05 MED ORDER — BUTAMBEN-TETRACAINE-BENZOCAINE 2-2-14 % EX AERO
INHALATION_SPRAY | CUTANEOUS | Status: DC | PRN
  Administered 2013-08-05: 2 via TOPICAL

## 2013-08-05 MED ORDER — DIPHENHYDRAMINE HCL 50 MG/ML IJ SOLN
INTRAMUSCULAR | Status: AC
  Filled 2013-08-05: qty 1

## 2013-08-05 MED ORDER — SODIUM CHLORIDE 0.9 % IV SOLN
INTRAVENOUS | Status: DC
  Administered 2013-08-05: 500 mL via INTRAVENOUS

## 2013-08-05 MED ORDER — FENTANYL CITRATE 0.05 MG/ML IJ SOLN
INTRAMUSCULAR | Status: AC
  Filled 2013-08-05: qty 4

## 2013-08-05 MED ORDER — MIDAZOLAM HCL 10 MG/2ML IJ SOLN
INTRAMUSCULAR | Status: AC
  Filled 2013-08-05: qty 2

## 2013-08-05 NOTE — Progress Notes (Signed)
Patient ID: RICKO MACDONELL, male   DOB: 10/28/1950, 63 y.o.   MRN: [id number]  [facility] PROGRESS NOTE  Vineland BUGGY [id number] DOB: Feb 26, 1951 DOA: 08/01/2013 PCP: Rozanna Box, MD  Brief narrative:  63 y.o. Male with history of alcohol abuse, recent admission earlier this month with suspected perforated peptic ulcer but further studies demonstrating no ulcer and he was sent home on ABX and PPI, presented to [facility] ED with main concern of several weeks duration of recurrent and intermittent right lower quadrant abdominal pain, 7/10 in severity when present and non radiating, no specific alleviating factors, worse with eating, associated with nausea, chills, night sweats. Repeat CT abd in ED showed recurrent retroperitoneal abscess involving the pancreas and several loculated fluid collections in the upper mid and hemiabdomen just inferior to the stomach. [facility] asked to admit for further evaluation. IR consulted for assistance with abscess drain, surgery and GI consulted as well for further assistance.   Active Problems:  Intra-abdominal abscess  - s/p placement of percutaneous drainage catheter into the right posterior para renal fascia retroperitoneal fluid collection 08/01/2012  - Approximately 60 mL of purulent fluid was successfully aspirated and sample was sent for culture and sensitivities.  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 Ewing Schlein 08/05/2013 --> Ttiny hiatal hernia.  Minimal gastritis  No signs of peptic ulcer disease 4 rather large second portion of the duodenum polyp status post biopsy. Otherwise within normal limits to the third part of the duodenum RECOMMENDATIONS --> await pathology will treat as pancreatitis and consider EUS for his pancreatitis and cyst at some point  Ct Abdomen Pelvis W Contrast 08/01/2013  1. Slight interval increase in size of discoid hypodense collection posterior to the right kidney extending inferiorly into the right pericolic gutter as above. Finding likely accounts for provided history of back and right hemi abdominal pain.  2. Additional multiple intra-abdominal fluid collections as detailed above. One of these collections located along the left lateral aspect of the stomach is new as compared to the prior exam, and is positioned within a region of extensive inflammatory changes seen on prior study. Overall, the remaining collections are similar in size as compared to prior study, with overall improved inflammatory mesenteric fat stranding. No free intraperitoneal air identified.  3. Stable appearance of complex cystic mass within the head of the pancreas. Short interval follow-up in 3 months with CT and/or MRI is recommended.  4. Small right pleural effusion with associated atelectasis.  5. Cholelithiasis.  6.  Sigmoid diverticulosis without acute diverticulitis.  7. Mild nodularity of the hepatic contour, suggesting cirrhosis.   Ct Image Guided Drainage By Percutaneous Catheter 08/01/2013 Successful placement of a 12 French percutaneous  drainage catheter into the right posterior para renal fascia retroperitoneal fluid collection. Approximately 60 mL of purulent fluid was successfully aspirated. A sample was sent for culture and sensitivities.  Antibiotics:  Ertapenem 01/22 -->  Code Status: Full  Family Communication: Pt at bedside  Disposition Plan: Home when medically stable    HPI/Subjective: No events overnight.   Objective: Filed Vitals:   08/05/13 1045 08/05/13 1100 08/05/13 1115 08/05/13 1158  BP: 122/72 90/51 91/49  130/74  Pulse:    58  Temp:    98.2 F (36.8 C)  TempSrc:    Oral  Resp: 28 30 12 20   Height:      Weight:      SpO2: 94% 94% 95% 96%    Intake/Output Summary (Last 24 hours) at 08/05/13 1210 Last data filed at 08/05/13 0900  Gross per 24 hour  Intake   2370 ml  Output  592.5 ml  Net 1777.5 ml    Exam:   General:  Pt is alert, follows commands appropriately, not in acute distress  Cardiovascular: Regular rate and rhythm, S1/S2, no murmurs, no rubs, no gallops  Respiratory: Clear to auscultation bilaterally, no wheezing, no crackles, no rhonchi  Abdomen: Soft, tender in epigastric area, non distended, bowel sounds present, no guarding  Extremities: No edema, pulses DP and PT palpable bilaterally  Neuro: Grossly nonfocal  Data Reviewed: Basic Metabolic Panel:  Recent Labs Lab 08/01/13 0213 08/02/13 0451 08/03/13 0429 08/04/13 0446 08/05/13 0500  NA 142 138 139 140 138  K 3.9 3.8 3.6* 3.8 3.7  CL 104 104 104 104 101  CO2 26 25 25 26 25   GLUCOSE 111* 101* 112* 96 111*  BUN 7 6 5* 4* 4*  CREATININE 0.72 0.69 0.63 0.64 0.62  CALCIUM 8.8 7.9* 8.0* 8.4 8.5  MG  --  1.6  --   --   --   PHOS  --  3.5  --   --   --    Liver Function Tests:  Recent Labs Lab 08/01/13 0213 08/02/13 0451  AST 16 10  ALT 7 <5  ALKPHOS 133* 90  BILITOT 0.5 0.4  PROT 6.2 5.2*  ALBUMIN 2.7* 2.1*    Recent Labs Lab 08/01/13 0213  LIPASE 14   CBC:  Recent Labs Lab  08/01/13 0213 08/02/13 0451 08/03/13 0429 08/04/13 0446  WBC 12.0* 9.3 9.4 7.5  NEUTROABS 7.9*  --   --   --   HGB 14.4 12.4* 11.6* 11.9*  HCT 42.2 37.4* 35.2* 35.9*  MCV 105.0* 105.6* 105.7* 103.8*  PLT 312 264 273 310    Recent Results (from the past 240 hour(s))  URINE CULTURE     Status: None   Collection Time    08/01/13  4:27 AM      Result Value Range Status   Specimen Description URINE, CLEAN CATCH   Final   Special Requests NONE   Final   Culture  Setup Time     Final   Value: 08/01/2013 08:57     Performed at [place]     Final   Value: NO GROWTH     Performed at [facility]   Culture     Final   Value: NO GROWTH  Performed at [facility]   Report Status 08/02/2013 FINAL   Final  CULTURE, ROUTINE-ABSCESS     Status: None   Collection Time    08/01/13  1:15 PM      Result Value Range Status   Specimen Description OTHER RETROPERITONEAL ABSCESS RT FLANK   Final   Special Requests NONE   Final   Gram Stain     Final   Value: NO WBC SEEN     NO SQUAMOUS EPITHELIAL CELLS SEEN     NO ORGANISMS SEEN     Performed at [facility]   Culture     Final   Value: NO GROWTH 3 DAYS     Performed at [facility]   Report Status 08/05/2013 FINAL   Final     Scheduled Meds: . cloNIDine  0.2 mg Transdermal Weekly  . docusate sodium  100 mg Oral BID  . ertapenem  1 g Intravenous Q24H  . folic acid  1 mg Intravenous Daily  . levETIRAcetam  750 mg Intravenous BID  . nicotine  21 mg Transdermal Daily  . pantoprazole  40 mg Oral Daily  . sodium chloride  3 mL Intravenous Q12H  . thiamine  100 mg Intravenous Daily  . tiotropium  18 mcg Inhalation Daily   Continuous Infusions:    Faye Ramsay, MD  [facility] Pager [phone]  If 7PM-7AM, please contact night-coverage [email or web address] Password [email or web address] 08/05/2013, 12:10 PM   LOS: 4 days

## 2013-08-05 NOTE — Op Note (Signed)
[facility] [place], [address]   ENDOSCOPY PROCEDURE REPORT  PATIENT: Jeffrey Castillo, Jeffrey Castillo  MR#: [id number] BIRTHDATE: [birth year] , 27  yrs. old GENDER: Male  ENDOSCOPIST: Clarene Essex, MD REFERRED BY:  PROCEDURE DATE:  08/05/2013 PROCEDURE:   EGD w/ biopsy ASA CLASS:   Class II INDICATIONS:abnormal CT of the GI tract and abdominal pain in the upper right quadrant.  rule out peptic ulcer disease  MEDICATIONS: Fentanyl 25 mcg IV and Versed 3 mg IV  TOPICAL ANESTHETIC:used  DESCRIPTION OF PROCEDURE:   After the risks benefits and alternatives of the procedure were thoroughly explained, informed consent was obtained.  The Pentax Gastroscope [id number]  endoscope was introduced through the mouth and advanced to the third portion of the duodenum , limited by Without limitations.   The instrument was slowly withdrawn as the mucosa was fully examined.the findings are recorded below and the patient tolerated the procedure well there was no obvious immediate complication         FINDINGS:1 tiny hiatal hernia 2. Minimal gastritis 3. No signs of peptic ulcer disease 4 rather large second portion of the duodenum polyp status post biopsy 5. Otherwise within normal limits to the third part of the duodenum COMPLICATIONS:none  ENDOSCOPIC IMPRESSION:above   RECOMMENDATIONS:await pathology will treat as pancreatitis and consider EUS for his pancreatitis and cyst at some point   REPEAT EXAM: as needed   _______________________________ Clarene Essex, MD eSigned:  Clarene Essex, MD 08/05/2013 10:44 AM    CC:  PATIENT NAME:  Jeffrey Castillo, Jeffrey Castillo MR#: [id number]

## 2013-08-06 ENCOUNTER — Encounter (HOSPITAL_COMMUNITY): Payer: Self-pay | Admitting: Gastroenterology

## 2013-08-06 MED ORDER — VITAMIN B-1 100 MG PO TABS
100.0000 mg | ORAL_TABLET | Freq: Every day | ORAL | Status: DC
  Administered 2013-08-07: 100 mg via ORAL
  Filled 2013-08-06: qty 1

## 2013-08-06 MED ORDER — LAMOTRIGINE 25 MG PO TABS
25.0000 mg | ORAL_TABLET | Freq: Every day | ORAL | Status: DC
  Administered 2013-08-06 – 2013-08-07 (×2): 25 mg via ORAL
  Filled 2013-08-06 (×2): qty 1

## 2013-08-06 MED ORDER — LEVETIRACETAM 750 MG PO TABS
750.0000 mg | ORAL_TABLET | Freq: Two times a day (BID) | ORAL | Status: DC
Start: 2013-08-06 — End: 2013-08-07
  Administered 2013-08-06 – 2013-08-07 (×2): 750 mg via ORAL
  Filled 2013-08-06 (×4): qty 1

## 2013-08-06 MED ORDER — FOLIC ACID 1 MG PO TABS
1.0000 mg | ORAL_TABLET | Freq: Every day | ORAL | Status: DC
  Administered 2013-08-07: 1 mg via ORAL
  Filled 2013-08-06: qty 1

## 2013-08-06 NOTE — Progress Notes (Signed)
Jeffrey Castillo 11:00 AM  Subjective: Patient doing fine without any complaints and we discussed the endoscopy with he and his sister and answered all of their questions and also discussed repeat scanning with radiology  Objective: Vital signs stable afebrile no acute distress abdomen is soft nontender  Assessment: Multiple medical problems currently asymptomatic  Plan: Repeat scan tomorrow possibly removed drain await duodenal biopsy no alcohol wonder if patient needs inpatient therapy and consider EUS or MRCP at some point [facility]

## 2013-08-06 NOTE — Progress Notes (Signed)
Patient ID: Jeffrey Castillo, male   DOB: Apr 30, 1951, 63 y.o.   MRN: [id number]  [facility] PROGRESS NOTE  JOHNEL YIELDING TDV:[id number] DOB: Dec 03, 1950 DOA: 08/01/2013 PCP: [name], MD  Brief narrative:  63 y.o. Male with history of alcohol abuse, recent admission earlier this month with suspected perforated peptic ulcer but further studies demonstrating no ulcer and he was sent home on ABX and PPI, presented to [facility] ED with main concern of several weeks duration of recurrent and intermittent right lower quadrant abdominal pain, 7/10 in severity when present and non radiating, no specific alleviating factors, worse with eating, associated with nausea, chills, night sweats. Repeat CT abd in ED showed recurrent retroperitoneal abscess involving the pancreas and several loculated fluid collections in the upper mid and hemiabdomen just inferior to the stomach. [facility] asked to admit for further evaluation. IR consulted for assistance with abscess drain, surgery and GI consulted as well for further assistance.   Active Problems:  Intra-abdominal abscess  - s/p placement of percutaneous drainage catheter into the right posterior para renal fascia retroperitoneal fluid collection 08/01/2012  - Approximately 60 mL of purulent fluid was successfully aspirated and sample was sent for culture and sensitivities.  - pt reports felling well this AM, less discomfort  - continue Ertapenem day #6, supportive care with analgesia as needed  - per GI rec's --> plan on repeating scan tomorrow possibly remove drain  Dysuria  - UA and urine culture with no growth  - pt denies dysuria this AM  C O P D  - this appears to be clinically stable, pt maintaining oxygen saturation at target range  Generalized convulsive epilepsy without mention of intractable epilepsy  - stable inpatient, continue keppra  HTN  - reasonable inpatient control  Suspected Duodenal ulcer perforation  - appreciate GI input  - per EGD  01/26 --> minimal gastritis and no signs of PUD, 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 Watt Climes 08/05/2013 --> Ttiny hiatal hernia. Minimal gastritis No signs of peptic ulcer disease 4 rather large second portion of the duodenum polyp status post biopsy. Otherwise within normal limits to the third part of the duodenum RECOMMENDATIONS --> await pathology will treat as pancreatitis and consider EUS for his pancreatitis and cyst at some point   Ct Abdomen Pelvis W Contrast 08/01/2013  1. Slight interval increase in size of discoid hypodense collection posterior to the right kidney extending inferiorly into the right pericolic gutter as above. Finding likely accounts for provided history of back and right hemi abdominal pain.  2. Additional multiple intra-abdominal fluid collections as detailed above. One of these collections located along the left lateral aspect of the stomach is new as compared to the prior exam, and is positioned within a region of extensive inflammatory changes seen on prior study. Overall, the remaining collections are similar in size as compared to prior study, with overall improved inflammatory mesenteric fat stranding. No free intraperitoneal air identified.  3. Stable appearance of complex cystic mass within the head of the pancreas. Short interval follow-up in 3 months with CT and/or MRI is recommended.  4. Small right pleural effusion with associated atelectasis.  5. Cholelithiasis.  6. Sigmoid diverticulosis without acute diverticulitis.  7. Mild nodularity of the hepatic contour, suggesting cirrhosis.   Ct Image Guided Drainage By  Percutaneous Catheter 08/01/2013 Successful placement of a 12 French percutaneous drainage catheter into the right posterior para renal fascia retroperitoneal fluid collection. Approximately 60 mL of purulent fluid was successfully aspirated. A sample was sent for culture and sensitivities.  Antibiotics:  Ertapenem 01/22 -->  Code Status: Full  Family Communication: Pt at bedside  Disposition Plan: Home when medically stable   HPI/Subjective: No events overnight.   Objective: Filed Vitals:   08/05/13 2150 08/06/13 0500 08/06/13 0835 08/06/13 1100  BP: 129/70 125/62  125/71  Pulse: 65 70  57  Temp: 98.3 F (36.8 C) 98.6 F (37 C)  97.6 F (36.4 C)  TempSrc: Oral Oral  Oral  Resp: 18 24  22   Height:      Weight:      SpO2: 97% 99% 94% 96%    Intake/Output Summary (Last 24 hours) at 08/06/13 1806 Last data filed at 08/06/13 1614  Gross per 24 hour  Intake    415 ml  Output   1861 ml  Net  -1446 ml    Exam:   General:  Pt is alert, follows commands appropriately, not in acute distress  Cardiovascular: Regular rhythm, bradycardic, S1/S2, no murmurs, no rubs, no gallops  Respiratory: Clear to auscultation bilaterally, no wheezing, no crackles, no rhonchi  Abdomen: Soft, non tender, non distended, bowel sounds present, no guarding  Extremities: No edema, pulses DP and PT palpable bilaterally  Neuro: Grossly nonfocal  Data Reviewed: Basic Metabolic Panel:  Recent Labs Lab 08/01/13 0213 08/02/13 0451 08/03/13 0429 08/04/13 0446 08/05/13 0500  NA 142 138 139 140 138  K 3.9 3.8 3.6* 3.8 3.7  CL 104 104 104 104 101  CO2 26 25 25 26 25   GLUCOSE 111* 101* 112* 96 111*  BUN 7 6 5* 4* 4*  CREATININE 0.72 0.69 0.63 0.64 0.62  CALCIUM 8.8 7.9* 8.0* 8.4 8.5  MG  --  1.6  --   --   --   PHOS  --  3.5  --   --   --    Liver Function Tests:  Recent Labs Lab 08/01/13 0213 08/02/13 0451  AST 16 10  ALT 7 <5  ALKPHOS 133* 90  BILITOT 0.5 0.4  PROT 6.2 5.2*   ALBUMIN 2.7* 2.1*    Recent Labs Lab 08/01/13 0213  LIPASE 14   CBC:  Recent Labs Lab 08/01/13 0213 08/02/13 0451 08/03/13 0429 08/04/13 0446  WBC 12.0* 9.3 9.4 7.5  NEUTROABS 7.9*  --   --   --   HGB 14.4 12.4* 11.6* 11.9*  HCT 42.2 37.4* 35.2* 35.9*  MCV 105.0* 105.6* 105.7* 103.8*  PLT 312 264 273 310    Recent Results (from the past 240 hour(s))  URINE CULTURE     Status: None   Collection Time    08/01/13  4:27 AM      Result Value Range Status   Specimen Description URINE, CLEAN CATCH   Final   Special Requests NONE   Final   Culture  Setup Time     Final   Value: 08/01/2013 08:57     Performed at [facility]     Final   Value: NO GROWTH     Performed at [facility]   Culture     Final   Value: NO GROWTH     Performed at [facility]   Report Status 08/02/2013 FINAL   Final  CULTURE, ROUTINE-ABSCESS     Status: None   Collection Time    08/01/13  1:15 PM      Result Value Range Status   Specimen Description OTHER RETROPERITONEAL ABSCESS RT FLANK   Final   Special Requests NONE   Final   Gram Stain     Final   Value: NO WBC SEEN     NO SQUAMOUS EPITHELIAL CELLS SEEN     NO ORGANISMS SEEN     Performed at [facility]   Culture     Final   Value: NO GROWTH 3 DAYS     Performed at [facility]   Report Status 08/05/2013 FINAL   Final  URINE CULTURE     Status: None   Collection Time    08/04/13 12:33 PM      Result Value Range Status   Specimen Description URINE, RANDOM   Final   Special Requests NONE   Final   Culture  Setup Time     Final   Value: 08/04/2013 17:03     Performed at [facility]     Final   Value: NO GROWTH     Performed at [facility]   Culture     Final   Value: NO GROWTH     Performed at [facility]   Report Status 08/05/2013 FINAL   Final     Scheduled Meds: . cloNIDine  0.2 mg Transdermal Weekly  . docusate sodium  100  mg Oral BID  . ertapenem  1 g Intravenous Q24H  . [START ON 0000000 folic acid  1 mg Oral Daily  . lamoTRIgine  25 mg Oral Daily  . levETIRAcetam  750 mg Oral BID  . nicotine  21 mg Transdermal Daily  . pantoprazole  40 mg Oral Daily  . sodium chloride  3 mL Intravenous Q12H  . [START ON 08/07/2013] thiamine  100 mg Oral Daily  . tiotropium  18 mcg Inhalation Daily   Continuous Infusions:   Faye Ramsay, MD  [facility] Pager [phone]  If 7PM-7AM, please contact night-coverage [email or web address] Password [email or web address] 08/06/2013, 6:06 PM   LOS: 5 days

## 2013-08-06 NOTE — Progress Notes (Signed)
Subjective: Pt resting in bed. Continues to feel better   Objective: Physical Exam: BP 125/62  Pulse 70  Temp(Src) 98.6 F (37 C) (Oral)  Resp 24  Ht 5\' 7"  (1.702 m)  Wt 150 lb (68.04 kg)  BMI 23.49 kg/m2  SpO2 94% (R)RP drain intact, output now thin clear serous 15-25cc output including what is flushed in   Labs: CBC  Recent Labs  08/04/13 0446  WBC 7.5  HGB 11.9*  HCT 35.9*  PLT 310   BMET  Recent Labs  08/04/13 0446 08/05/13 0500  NA 140 138  K 3.8 3.7  CL 104 101  CO2 26 25  GLUCOSE 96 111*  BUN 4* 4*  CREATININE 0.64 0.62  CALCIUM 8.4 8.5    Assessment/Plan: (R)RP abscess s/p perc drain Recommend repeat CT next 1-2 days to reassess abscess and determine if drain can be removed.   LOS: 5 days    Ascencion Dike PA-C 08/06/2013 10:15 AM

## 2013-08-07 ENCOUNTER — Inpatient Hospital Stay (HOSPITAL_COMMUNITY): Payer: Medicare Other

## 2013-08-07 ENCOUNTER — Encounter (HOSPITAL_COMMUNITY): Payer: Self-pay | Admitting: Radiology

## 2013-08-07 LAB — CBC
HCT: 36.7 % — ABNORMAL LOW (ref 39.0–52.0)
Hemoglobin: 12.4 g/dL — ABNORMAL LOW (ref 13.0–17.0)
MCH: 34.5 pg — ABNORMAL HIGH (ref 26.0–34.0)
MCHC: 33.8 g/dL (ref 30.0–36.0)
MCV: 102.2 fL — ABNORMAL HIGH (ref 78.0–100.0)
Platelets: 320 10*3/uL (ref 150–400)
RBC: 3.59 MIL/uL — ABNORMAL LOW (ref 4.22–5.81)
RDW: 13.9 % (ref 11.5–15.5)
WBC: 10.9 10*3/uL — ABNORMAL HIGH (ref 4.0–10.5)

## 2013-08-07 LAB — BASIC METABOLIC PANEL
BUN: 5 mg/dL — ABNORMAL LOW (ref 6–23)
CO2: 24 mEq/L (ref 19–32)
Calcium: 8.7 mg/dL (ref 8.4–10.5)
Chloride: 100 mEq/L (ref 96–112)
Creatinine, Ser: 0.71 mg/dL (ref 0.50–1.35)
GFR calc Af Amer: >90 mL/min (ref >90)
GFR calc non Af Amer: >90 mL/min (ref >90)
Glucose, Bld: 115 mg/dL — ABNORMAL HIGH (ref 70–99)
Potassium: 3.4 mEq/L — ABNORMAL LOW (ref 3.7–5.3)
Sodium: 135 mEq/L — ABNORMAL LOW (ref 137–147)

## 2013-08-07 MED ORDER — METRONIDAZOLE 500 MG PO TABS: 500.0000 mg | ORAL_TABLET | Freq: Three times a day (TID) | ORAL | Status: DC

## 2013-08-07 MED ORDER — IOHEXOL 300 MG/ML  SOLN
100.0000 mL | Freq: Once | INTRAMUSCULAR | Status: AC | PRN
  Administered 2013-08-07: 100 mL via INTRAVENOUS

## 2013-08-07 MED ORDER — CIPROFLOXACIN HCL 500 MG PO TABS: 500.0000 mg | ORAL_TABLET | Freq: Two times a day (BID) | ORAL | Status: DC

## 2013-08-07 NOTE — Discharge Summary (Signed)
Physician Discharge Summary  Jeffrey Castillo [id number] DOB: [birth year] DOA: 08/01/2013  PCP: [name], MD  Admit date: 08/01/2013 Discharge date: 08/07/2013  Time spent: 35 minutes  Recommendations for Outpatient Follow-up:  1. Follow up with PCP in 1 day as scheduled 2. Follow up with Dr. Lenard Simmer in 1-2 weeks   Recommendations for primary care physician for things to follow:  Repeat BMP  Discharge Diagnoses:  Active Problems:   HYPERTENSION   C O P D   Generalized convulsive epilepsy without mention of intractable epilepsy   Suspected Duodenal ulcer perforation   Chronic pain in left foot   Intra-abdominal abscess  Discharge Condition: stable  Diet recommendation: regular  Filed Weights   08/01/13 0137  Weight: 68.04 kg (150 lb)   History of present illness:  63 y.o. Male with history of alcohol abuse, recent admission earlier this month with suspected perforated peptic ulcer but further studies demonstrating no ulcer and he was sent home on ABX and PPI, presented to [facility] ED with main concern of several weeks duration of recurrent and intermittent right lower quadrant abdominal pain, 7/10 in severity when present and non radiating, no specific alleviating factors, worse with eating, associated with nausea, chills, night sweats. Repeat CT abd in ED showed recurrent retroperitoneal abscess involving the pancreas and several loculated fluid collections in the upper mid and hemiabdomen just inferior to the stomach. TRH asked to admit for further evaluation. IR consulted for assistance with abscess drain, surgery and GI consulted as well for further assistance.   Hospital Course:  Intra-abdominal abscess - s/p placement of percutaneous drainage catheter into the right posterior para renal fascia retroperitoneal fluid collection 08/01/2012. Approximately 60 mL of purulent fluid was successfully aspirated and sample was sent for culture and sensitivities. Following the procedure  patient reports felling well, at baseline, without discomfort. Repeat CT scan with resolution of his abscess, drain removed today without complications. Patient very insistent to be discharged home now that the drain has been removed. I discussed the case with Dr. Watt Climes and will narrow his antibiotics to Ciprofloxacin and Metronidazole and will need an additional week to complete a 14 day course from the time of the drainage. He received Ertapenem for 7 days in the hospital. Cultures without growth. Dysuria - UA and urine culture with no growth. This has resolved.  COPD - this appears to be clinically stable, pt maintaining oxygen saturation at target range  Generalized convulsive epilepsy without mention of intractable epilepsy - stable inpatient, continue keppra  HTN - reasonable inpatient control  Suspected Duodenal ulcer perforation - appreciate GI input. Per EGD 01/26 --> minimal gastritis and no signs of PUD, large second portion of the duodenum polyp status post biopsy --> follow up on path report. He is to follow up with Dr. Amedeo Plenty as an outpatient.  Hypokalemia - supplemented Leukocytosis - secondary to abscess as noted above, continue Ertapenem day #6. Improving.   Procedures:  EGD 08/05/2013  Ttiny hiatal hernia. Minimal gastritis No signs of peptic ulcer disease 4 rather large second portion of the duodenum polyp status post biopsy. Otherwise within normal limits to the third part of the duodenum RECOMMENDATIONS --> await pathology will treat as pancreatitis and consider EUS for his pancreatitis and cyst at some point   Ct Image Guided Drainage By Percutaneous Catheter  08/01/2013 Successful placement of a 12 French percutaneous drainage catheter into the right posterior para renal fascia retroperitoneal fluid collection. Approximately 60 mL of purulent fluid  was successfully aspirated. A sample was sent for culture and sensitivities.   Consultations:  IR  GI  Discharge Exam: Filed  Vitals:   08/07/13 0439 08/07/13 1035 08/07/13 1050 08/07/13 1402  BP: 139/71  116/65 128/68  Pulse: 52  59 59  Temp: 98.5 F (36.9 C)  98.1 F (36.7 C) 97.6 F (36.4 C)  TempSrc: Oral  Oral Oral  Resp: 18  20 18   Height:      Weight:      SpO2: 96% 92% 93% 94%   General: NAD Cardiovascular: RRR Respiratory: CTA biL  Discharge Instructions   Future Appointments Provider Department Dept Phone   10/21/2013 10:30 AM Dennie Bible, NP [facility] [phone]       Medication List         ciprofloxacin 500 MG tablet  Commonly known as:  CIPRO  Take 1 tablet (500 mg total) by mouth 2 (two) times daily.     cloNIDine 0.2 mg/24hr patch  Commonly known as:  CATAPRES - Dosed in mg/24 hr  Place 1 patch onto the skin once a week.     lamoTRIgine 25 MG tablet  Commonly known as:  LAMICTAL  Take 1 tablet (25 mg total) by mouth daily. Take 25 mg (1 tablet) once daily x 2 weeks, then 50mg  (2 tablets) once daily x 2 weeks, then 50mg  (2 tablets) two times a day x 2 weeks.     levETIRAcetam 750 MG tablet  Commonly known as:  KEPPRA  Take 1 tablet (750 mg total) by mouth 2 (two) times daily. Take 750mg  dose until you have titrated back to your original lamotrigine dose     metroNIDAZOLE 500 MG tablet  Commonly known as:  FLAGYL  Take 1 tablet (500 mg total) by mouth every 8 (eight) hours.     pantoprazole 40 MG tablet  Commonly known as:  PROTONIX  Take 1 tablet (40 mg total) by mouth 2 (two) times daily.           Follow-up Information   Follow up with BABAOFF, MARC E, MD. Schedule an appointment as soon as possible for a visit in 1 week.   Specialty:  Family Medicine   Contact information:   [address] [place] [address] [phone]       Follow up with HAYES,JOHN C, MD. Schedule an appointment as soon as possible for a visit in 1 week.   Specialty:  Gastroenterology   Contact information:   [address], [place] [address] [phone]       The results of significant diagnostics from this hospitalization (including imaging, microbiology, ancillary and laboratory) are listed below for reference.    Significant Diagnostic Studies: Ct Abdomen Pelvis W Contrast  08/07/2013   CLINICAL DATA:  Intra-abdominal and retroperitoneal abscess follow suspected peptic ulcer disease perforation. Percutaneous drainage of a right retroperitoneal collection.  EXAM: CT ABDOMEN AND PELVIS WITH CONTRAST  TECHNIQUE: Multidetector CT imaging of the abdomen and pelvis was performed using the standard protocol following bolus administration of intravenous contrast.  CONTRAST:  12mL OMNIPAQUE IOHEXOL 300 MG/ML  SOLN  COMPARISON:  CT IMAGE GUIDED DRAINAGE BY PERCUTANEOUS CATHETER dated 08/01/2013; CT ABD/PELVIS W CM dated 07/17/2013; CT ABD/PELVIS W CM dated 08/01/2013; CT UROGRAM/STONE PROTOCOL W/O dated 05/29/2007; MR L SPINE WO/W CM dated 06/18/2010  FINDINGS: There is mild bibasilar atelectasis and small left effusion. No pericardial fluid.  Low-density 12 mm lesion in the dome  the right hepatic lobe is unchanged. Adjacent 2 mm lesion on the same slice is also unchanged. Likely represent benign cysts. No biliary duct dilatation. The gallbladder contains calculi and sludge within the lumen.  Again demonstrated cystic lesion within the head / uncinate of the pancreas not significantly changed from short interval follow ups measuring 22 x 19 mm compared to 20 x 21 mm on prior. Lesion decreased in size from CT of 07/17/2013. No evidence of pancreatic duct dilatation. There is no pancreatic inflammation. The spleen and kidneys are normal. Nonenhancing cysts within the right kidney. The adrenal glands have small nodules similar to comparison exams.  The retroperitoneal collection, posterior to the right kidney has resolved following placement of the percutaneous drainage catheter. No measurable fluid collection remains. The rim  enhancing fluid collections more inferiorly in the right perirenal space are unchanged. The largest measures 37 x 26 mm compared to 39 by 28 mm on prior. Inferior to the left kidney in the perirenal space are similar enhancing collections. Largest measuring 26 x 18 mm compared to 34 x 25 mm on prior.  Fluid collection adjacent to the greater curvature of the stomach measures 51 x 32 mm compared to 53 x 40 mm on prior for some interval contraction. Peritoneal fluid collection within the peritoneal space anterior to the third portion the duodenum measures 52 by 25 mm compared to 48 x 23 mm on prior. There is nodular thickening within the peritoneum adjacent to this mesenteric collection measuring 57 by 20 mm compared to 62 x 17 mm for no significant change.  The stomach, duodenum, and small bowel are unremarkable. Appendix is normal. Several diverticula of the colon without acute inflammation.  The abdominal aorta is normal caliber with heavy intimal calcification. No retroperitoneal adenopathy. There several periaortic fluid collections on the left measuring 13 and 16 mm (image 47) not changed from prior.  In the pelvis, the bladder and prostate gland are normal. No pelvic lymphadenopathy. No aggressive osseous lesion. Generator pack in the right posterior flank with electrodes extending to the central canal spine.  IMPRESSION: 1. Interval resolution of a fluid collection posterior to the right kidney following percutaneous drain placement. 2. Multiple smaller retroperitoneal fluid collections within the left and right perirenal space are unchanged. 3. Fluid collection adjacent to the greater curvature of stomach may is mildly contracted. 4. Fluid collections with associated nodularity within the central mesentery are not changed. 5. No gross abnormality of the stomach or duodenum. 6. Cholelithiasis without cholecystitis. 7. Cystic lesion in the uncinate of the pancreas is unchanged from recent exam but decreased  from 07/17/2013.   Electronically Signed   By: Suzy Bouchard M.D.   On: 08/07/2013 09:26   Ct Abdomen Pelvis W Contrast  08/01/2013   CLINICAL DATA:  Back pain, radiating into right abdomen. Recent perforated gastric ulcer  EXAM: CT ABDOMEN AND PELVIS WITH CONTRAST  TECHNIQUE: Multidetector CT imaging of the abdomen and pelvis was performed using the standard protocol following bolus administration of intravenous contrast.  CONTRAST:  120mL OMNIPAQUE IOHEXOL 300 MG/ML  SOLN  COMPARISON:  Prior CT from 07/17/2013.  FINDINGS: Small right pleural effusion with associated atelectasis is present. There is mild lingular and left basilar atelectasis as well.  Mild cirrhotic changes of the liver again noted. Small hepatic cysts noted at the hepatic dome, unchanged. Tiny hypodensity adjacent to the gallbladder fossa is too small the characterize by CT, also stable. Liver is otherwise unremarkable.  Layering hyperdensity is again  seen within the gallbladder neck, suggestive of stones and/or sludge. The spleen and adrenal glands are unremarkable.  Complex cystic mass measuring 2.0 x 2.1 cm is again seen within the pancreatic head/uncinate process (series 2, image 35), unchanged.  2.9 cm right renal cyst again noted. Smaller left renal cyst is unchanged as well. Kidneys are otherwise unremarkable without evidence of hydronephrosis or nephrolithiasis.  Discoid hypodense collection measuring 2.6 x 6.8 x 8.1 cm is again seen superior and posterior to the right kidney (series 5, image 31). Overall, this collection is slightly increased in size relative to prior exam. Complex inflammatory changes are seen extending inferiorly from this collection towards the right pericolic gutter. A second heterogeneous loculated collection measures approximately 2.9 x 3.7 x 5.5 cm (series 2, image 40). This collection abuts the posterior aspect of the inferior pole the right kidney. There is inflammatory fat stranding within the adjacent  right pericolic gutter and along the right pericolic fascia.  Several additional loculated fluid collection is are again seen within the upper mid and left hemi abdomen. Collection along the midline just inferior to the stomach measures 1.7 x 2.3 cm, similar to prior. Additional irregular collection located slightly more inferiorly measures 2.3 x 4.6 cm (series 2, image 42). These are more loculated well-formed as compared to the prior examination. Inflammatory nodularity is seen adjacent to this collection within the left hemi abdomen (series 2, image 44). A larger collection measuring approximately 7.4 x 3.3 cm is seen along the left lateral aspect of the greater curvature of the stomach (series 2, image 29).  An additional smaller collection is seen dependently within the right pericolic gutter, measuring 1.8 x 3.2 cm (series 2, image 59).  No evidence of bowel obstruction. Mild inflammatory fat stranding and wall thickening again seen about the distal portion of the duodenum, slightly improved. Sigmoid diverticulosis noted.  Bladder and prostate are within normal limits.  No free intraperitoneal air identified. A few mildly prominent mesenteric lymph nodes are noted scattered throughout the abdomen, likely reactive.  Prominent aorto bi-iliac atherosclerotic calcifications noted.  Spinal stimulator device again noted. Multilevel degenerative changes are stable. No acute osseous abnormality.  IMPRESSION: 1. Slight interval increase in size of discoid hypodense collection posterior to the right kidney extending inferiorly into the right pericolic gutter as above. Finding likely accounts for provided history of back and right hemi abdominal pain. 2. Additional multiple intra-abdominal fluid collections as detailed above. One of these collections located along the left lateral aspect of the stomach is new as compared to the prior exam, and is positioned within a region of extensive inflammatory changes seen on prior  study. Overall, the remaining collections are similar in size as compared to prior study, with overall improved inflammatory mesenteric fat stranding. No free intraperitoneal air identified. 3. Stable appearance of complex cystic mass within the head of the pancreas. Short interval follow-up in 3 months with CT and/or MRI is recommended. 4. Small right pleural effusion with associated atelectasis. 5. Cholelithiasis. 6. Sigmoid diverticulosis without acute diverticulitis. 7. Mild nodularity of the hepatic contour, suggesting cirrhosis.   Electronically Signed   By: Jeannine Boga M.D.   On: 08/01/2013 04:44   Ct Abdomen Pelvis W Contrast  07/18/2013   ADDENDUM REPORT: 07/18/2013 08:47  ADDENDUM: Lab work today demonstrates a lipase of 103. The above findings may simply represent advanced acute pancreatitis with multiple phlegmon collections. The cystic mass in the head of the pancreas should be followed with the repeat CT  in 3 months.   Electronically Signed   By: Maryclare Bean M.D.   On: 07/18/2013 08:47   07/18/2013   CLINICAL DATA:  Lower abdominal pain  EXAM: CT ABDOMEN AND PELVIS WITH CONTRAST  TECHNIQUE: Multidetector CT imaging of the abdomen and pelvis was performed using the standard protocol following bolus administration of intravenous contrast.  CONTRAST:  OMNIPAQUE IOHEXOL 300 MG/ML  SOLN  COMPARISON:  05/29/2007  FINDINGS: Spinal cord stimulator is in place extending from the upper lumbar spine at canal into the thoracic posterior spinal canal.  Nonspecific hypodensities are present within the liver. The liver is somewhat cirrhotic. The largest is towards the dome measuring 1.0 cm. They are most likely cysts.  Small gallstone is present. No obvious inflammatory changes of the gallbladder.  There is a significant inflammatory process occurring within the mesenteric. The inflammatory process relatively spares the pancreas. There is some stranding towards the tail of the pancreas. The  inflammatory process is most intimately associated with the 3rd and 4th portions of the duodenum. This is associated with wall thickening. There is also a possible defect within the wall of the duodenum. See image 54 of series 4 on the coronal reconstructions.  There is a complex cystic mass within the uncinate process of the pancreas measuring 2.8 x 2.7 cm. Fluid density is seen extending from the duodenum and uncinate process into the mesenteric.  There is a complex fluid and soft tissue mass within the mesentery measuring 3.6 x 6.8 cm on image 44. This may represent phlegmon of or necrotic adenopathy. Similar densities with some fat density are seen in the posterior right perinephric space. There is a discoid fluid collection superior to the right kidney in the retroperitoneum measuring 2.8 x 5.5 cm.  Of note, there is no definitive free intraperitoneal gas.  Adrenal glands are within normal limits. Simple cysts in both kidneys.  Sigmoid diverticulosis without definite acute diverticulitis. Bladder is within normal limits.  Degenerative and postoperative changes in the lumbar spine.  IMPRESSION: There is a prominent inflammatory process within the mesenteries of the abdomen as described above. The most critical finding is inflammatory changes intimately associated with the distal duodenum an a possible defect within the duodenum. There is no definite extraperitoneal or intraperitoneal gas however perforation cannot be excluded. There is relative sparing of the inflammatory process to the pancreas.  Other intra-abdominal fluid collections as described.  Complex mass within the mesentery which may represent adenopathy or an inflammatory phlegmon. Similar densities posterior to the right kidney.  Cholelithiasis.  Nonspecific hypodensities in the liver. Because the liver is somewhat cirrhotic. The patient is at risk for liver malignancy. Follow-up outpatient MRI is recommended.  Electronically Signed: By: Maryclare Bean  M.D. On: 07/17/2013 20:22   Dg Abd Acute W/chest  07/17/2013   CLINICAL DATA:  Constipation, nausea, vomiting, rectal bleeding, history hypertension  EXAM: ACUTE ABDOMEN SERIES (ABDOMEN 2 VIEW & CHEST 1 VIEW)  COMPARISON:  Chest radiographs 08/13/2010  FINDINGS: Rotated on chest radiograph to the left.  Normal heart size, mediastinal contours, and pulmonary vascularity.  Lungs appear emphysematous but clear.  No pleural effusion or pneumothorax.  Prior cervical spine fusion.  Intraspinal stimulator leads noted.  Bones diffusely demineralized.  Normal bowel gas pattern.  No bowel dilatation or bowel wall thickening or free intraperitoneal air.  Scattered atherosclerotic calcifications.  No definite urinary tract calcification.  IMPRESSION: Question COPD.  No acute abdominal findings.   Electronically Signed   By: Loraine Leriche  Thornton Papas M.D.   On: 07/17/2013 20:00   Dg Ugi W/water Sol Cm  07/21/2013   CLINICAL DATA:  Possible perforated duodenal ulcer, evaluate for leak  EXAM: WATER SOLUBLE UPPER GI SERIES  TECHNIQUE: Single-column upper GI series was performed using water soluble contrast.  CONTRAST:  , 164mL OMNIPAQUE IOHEXOL 300 MG/ML  SOLN  COMPARISON:  CT scan 07/17/2013.  FLUOROSCOPY TIME:  4 min  FINDINGS: The visualized esophagus shows normal contour and distensibility. Normal motility.  The stomach shows normal contour and distensibility. Mild decreased gastric motility.  Duodenal bulb and duodenal sweep is unremarkable. No evidence of gastric outlet obstruction. Contrast is noted advancing in duodenum and proximal jejunum. There is no evidence of contrast extravasation. Static image post fluoroscopy demonstrate persistent residual contrast material within stomach.  IMPRESSION: 1. Unremarkable visualized esophagus. No evidence of gastric outlet obstruction. Mild decrease gastric motility with some delay in gastric emptying. No evidence of gastric outlet obstruction. Unremarkable duodenal bulb and duodenal sweep. No  evidence of contrast extravasation. Visualized proximal small bowel is unremarkable.   Electronically Signed   By: Lahoma Crocker M.D.   On: 07/21/2013 12:32   Ct Image Guided Drainage By Percutaneous Catheter  08/01/2013   CLINICAL DATA:  63 year old male with a history of recent perforated gastric ulcer. He has multifocal regions of abnormality throughout his abdomen and deep pelvis. The majority of the collections represent a combination of fat necrosis and inflammatory phlegmon. However, there is 1 peripherally enhancing centrally low attenuation collection in the posterior pararenal fascia concerning for abscess. The patient is experienced continued pain, and fever despite palpation antibiotics. Percutaneous drainage is warranted.  EXAM: CT IMAGE GUIDED DRAINAGE BY PERCUTANEOUS CATHETER  Date: 08/01/2013  TECHNIQUE: Informed consent was obtained from the patient following explanation of the procedure, risks, benefits and alternatives. The patient understands, agrees and consents for the procedure. All questions were addressed. A time out was performed.  Maximal barrier sterile technique utilized including caps, mask, sterile gowns, sterile gloves, large sterile drape, hand hygiene, and Betadine skin prep.  A planning axial CT scan was performed. The loculated fluid collection along the right posterior para renal fascia was successfully identified. An appropriate skin entry site was selected and marked. Local anesthesia was attained by infiltration with 1% lidocaine. A small dermatotomy was made. Using CT fluoroscopic guidance, an 18 gauge trocar needle was carefully advanced into the fluid collection. An Amplatz wire was advanced coaxially through the needle. The skin tract was then serially dilated to 12 [place]. A Cook 12 [place] multipurpose drain was then advanced over the wire and formed within the fluid collection. Approximately 60 mL of frankly purulent fluid was successfully aspirated. A sample was sent for  culture and sensitivities. The catheter was then secured to the skin with 0 Prolene suture and bumper technique. A sterile bandage was placed. The patient tolerated the procedure well.  ANESTHESIA/SEDATION: Moderate (conscious) sedation was used. Two mg Versed, 100 mcg Fentanyl were administered intravenously. The patient's vital signs were monitored continuously by radiology nursing throughout the procedure.  Sedation Time: 25 minutes  PROCEDURE: 1. CT-guided drain placement Interventional Radiologist:  Criselda Peaches, MD  IMPRESSION: Successful placement of a 5 French percutaneous drainage catheter into the right posterior para renal fascia retroperitoneal fluid collection. Approximately 60 mL of purulent fluid was successfully aspirated. A sample was sent for culture and sensitivities.  Signed,  Criselda Peaches, MD  [facility]  [facility]   Electronically Signed  By: Malachy Moan M.D.   On: 08/01/2013 17:49    Microbiology: Recent Results (from the past 240 hour(s))  URINE CULTURE     Status: None   Collection Time    08/01/13  4:27 AM      Result Value Range Status   Specimen Description URINE, CLEAN CATCH   Final   Special Requests NONE   Final   Culture  Setup Time     Final   Value: 08/01/2013 08:57     Performed at [facility] Count     Final   Value: NO GROWTH     Performed at [facility]   Culture     Final   Value: NO GROWTH     Performed at [facility]   Report Status 08/02/2013 FINAL   Final  CULTURE, ROUTINE-ABSCESS     Status: None   Collection Time    08/01/13  1:15 PM      Result Value Range Status   Specimen Description OTHER RETROPERITONEAL ABSCESS RT FLANK   Final   Special Requests NONE   Final   Gram Stain     Final   Value: NO WBC SEEN     NO SQUAMOUS EPITHELIAL CELLS SEEN     NO ORGANISMS SEEN     Performed at [facility]   Culture     Final   Value: NO  GROWTH 3 DAYS     Performed at [facility]   Report Status 08/05/2013 FINAL   Final  URINE CULTURE     Status: None   Collection Time    08/04/13 12:33 PM      Result Value Range Status   Specimen Description URINE, RANDOM   Final   Special Requests NONE   Final   Culture  Setup Time     Final   Value: 08/04/2013 17:03     Performed at [facility] Count     Final   Value: NO GROWTH     Performed at [facility]   Culture     Final   Value: NO GROWTH     Performed at [facility]   Report Status 08/05/2013 FINAL   Final     Labs: Basic Metabolic Panel:  Recent Labs Lab 08/01/13 0213 08/02/13 0451 08/03/13 0429 08/04/13 0446 08/05/13 0500 08/07/13 0420  NA 142 138 139 140 138 135*  K 3.9 3.8 3.6* 3.8 3.7 3.4*  CL 104 104 104 104 101 100  CO2 26 25 25 26 25 24   GLUCOSE 111* 101* 112* 96 111* 115*  BUN 7 6 5* 4* 4* 5*  CREATININE 0.72 0.69 0.63 0.64 0.62 0.71  CALCIUM 8.8 7.9* 8.0* 8.4 8.5 8.7  MG  --  1.6  --   --   --   --   PHOS  --  3.5  --   --   --   --    Liver Function Tests:  Recent Labs Lab 08/01/13 0213 08/02/13 0451  AST 16 10  ALT 7 <5  ALKPHOS 133* 90  BILITOT 0.5 0.4  PROT 6.2 5.2*  ALBUMIN 2.7* 2.1*    Recent Labs Lab 08/01/13 0213  LIPASE 14   CBC:  Recent Labs Lab 08/01/13 0213 08/02/13 0451 08/03/13 0429 08/04/13 0446 08/07/13 0420  WBC 12.0* 9.3 9.4 7.5 10.9*  NEUTROABS 7.9*  --   --   --   --  HGB 14.4 12.4* 11.6* 11.9* 12.4*  HCT 42.2 37.4* 35.2* 35.9* 36.7*  MCV 105.0* 105.6* 105.7* 103.8* 102.2*  PLT 312 264 273 310 320    Signed:  GHERGHE, COSTIN  [facility] 08/07/2013, 5:07 PM

## 2013-08-07 NOTE — Progress Notes (Signed)
2 Days Post-Op  Subjective: Pt doing ok; no new c/o  Objective: Vital signs in last 24 hours: Temp:  [97.6 F (36.4 C)-99 F (37.2 C)] 98.5 F (36.9 C) (01/28 0439) Pulse Rate:  [52-58] 52 (01/28 0439) Resp:  [17-22] 18 (01/28 0439) BP: (125-149)/(71-79) 139/71 mmHg (01/28 0439) SpO2:  [91 %-97 %] 96 % (01/28 0439) Last BM Date: 08/06/13  Intake/Output from previous day: 01/27 0701 - 01/28 0700 In: 895 [P.O.:720; IV Piggyback:160] Out: 1377.5 [Urine:1350; Drains:27.5] Intake/Output this shift:    Right RP drain intact, output minimal, all cx's neg  Lab Results:   Recent Labs  08/07/13 0420  WBC 10.9*  HGB 12.4*  HCT 36.7*  PLT 320   BMET  Recent Labs  08/05/13 0500 08/07/13 0420  NA 138 135*  K 3.7 3.4*  CL 101 100  CO2 25 24  GLUCOSE 111* 115*  BUN 4* 5*  CREATININE 0.62 0.71  CALCIUM 8.5 8.7   PT/INR No results found for this basename: LABPROT, INR,  in the last 72 hours ABG No results found for this basename: PHART, PCO2, PO2, HCO3,  in the last 72 hours  Studies/Results: Ct Abdomen Pelvis W Contrast  08/07/2013   CLINICAL DATA:  Intra-abdominal and retroperitoneal abscess follow suspected peptic ulcer disease perforation. Percutaneous drainage of a right retroperitoneal collection.  EXAM: CT ABDOMEN AND PELVIS WITH CONTRAST  TECHNIQUE: Multidetector CT imaging of the abdomen and pelvis was performed using the standard protocol following bolus administration of intravenous contrast.  CONTRAST:  154mL OMNIPAQUE IOHEXOL 300 MG/ML  SOLN  COMPARISON:  CT IMAGE GUIDED DRAINAGE BY PERCUTANEOUS CATHETER dated 08/01/2013; CT ABD/PELVIS W CM dated 07/17/2013; CT ABD/PELVIS W CM dated 08/01/2013; CT UROGRAM/STONE PROTOCOL W/O dated 05/29/2007; MR L SPINE WO/W CM dated 06/18/2010  FINDINGS: There is mild bibasilar atelectasis and small left effusion. No pericardial fluid.  Low-density 12 mm lesion in the dome the right hepatic lobe is unchanged. Adjacent 2 mm lesion on the  same slice is also unchanged. Likely represent benign cysts. No biliary duct dilatation. The gallbladder contains calculi and sludge within the lumen.  Again demonstrated cystic lesion within the head / uncinate of the pancreas not significantly changed from short interval follow ups measuring 22 x 19 mm compared to 20 x 21 mm on prior. Lesion decreased in size from CT of 07/17/2013. No evidence of pancreatic duct dilatation. There is no pancreatic inflammation. The spleen and kidneys are normal. Nonenhancing cysts within the right kidney. The adrenal glands have small nodules similar to comparison exams.  The retroperitoneal collection, posterior to the right kidney has resolved following placement of the percutaneous drainage catheter. No measurable fluid collection remains. The rim enhancing fluid collections more inferiorly in the right perirenal space are unchanged. The largest measures 37 x 26 mm compared to 39 by 28 mm on prior. Inferior to the left kidney in the perirenal space are similar enhancing collections. Largest measuring 26 x 18 mm compared to 34 x 25 mm on prior.  Fluid collection adjacent to the greater curvature of the stomach measures 51 x 32 mm compared to 53 x 40 mm on prior for some interval contraction. Peritoneal fluid collection within the peritoneal space anterior to the third portion the duodenum measures 52 by 25 mm compared to 48 x 23 mm on prior. There is nodular thickening within the peritoneum adjacent to this mesenteric collection measuring 57 by 20 mm compared to 62 x 17 mm for no significant change.  The stomach, duodenum, and small bowel are unremarkable. Appendix is normal. Several diverticula of the colon without acute inflammation.  The abdominal aorta is normal caliber with heavy intimal calcification. No retroperitoneal adenopathy. There several periaortic fluid collections on the left measuring 13 and 16 mm (image 47) not changed from prior.  In the pelvis, the bladder and  prostate gland are normal. No pelvic lymphadenopathy. No aggressive osseous lesion. Generator pack in the right posterior flank with electrodes extending to the central canal spine.  IMPRESSION: 1. Interval resolution of a fluid collection posterior to the right kidney following percutaneous drain placement. 2. Multiple smaller retroperitoneal fluid collections within the left and right perirenal space are unchanged. 3. Fluid collection adjacent to the greater curvature of stomach may is mildly contracted. 4. Fluid collections with associated nodularity within the central mesentery are not changed. 5. No gross abnormality of the stomach or duodenum. 6. Cholelithiasis without cholecystitis. 7. Cystic lesion in the uncinate of the pancreas is unchanged from recent exam but decreased from 07/17/2013.   Electronically Signed   By: Suzy Bouchard M.D.   On: 08/07/2013 09:26    Anti-infectives: Anti-infectives   Start     Dose/Rate Route Frequency Ordered Stop   08/01/13 1000  ertapenem (INVANZ) 1 g in sodium chloride 0.9 % 50 mL IVPB     1 g 100 mL/hr over 30 Minutes Intravenous Every 24 hours 08/01/13 0852     08/01/13 0930  ciprofloxacin (CIPRO) IVPB 400 mg  Status:  Discontinued     400 mg 200 mL/hr over 60 Minutes Intravenous Every 12 hours 08/01/13 0917 08/01/13 0951   08/01/13 0930  metroNIDAZOLE (FLAGYL) IVPB 500 mg  Status:  Discontinued     500 mg 100 mL/hr over 60 Minutes Intravenous Every 8 hours 08/01/13 0917 08/01/13 0951   08/01/13 0530  cephALEXin (KEFLEX) capsule 500 mg     500 mg Oral  Once 08/01/13 7209 08/01/13 0604      Assessment/Plan: s/p right RP fluid collection drainage 1/22; CT today reveals resolution of drained collection; there are other stable smaller collections (?fat necrosis) present which do not require drainage at this point; the drained collection also does not need injection prior to removal per Dr. Ileene Hutchinson). Case d/w Dr. Watt Climes and will plan to remove RP  drain today.  LOS: 6 days    ALLRED,D [facility] 08/07/2013

## 2013-08-07 NOTE — Progress Notes (Signed)
D/C instructions reviewed w/ pt and wife. Both verbalize understanding and all questions answered. Pt d/c in w/c by NT to wife's car. Pt in stable condition and in possession of d/c instructions, scripts for cipro and flagyl, and all personal belongings.

## 2013-08-07 NOTE — Progress Notes (Signed)
Jeffrey Castillo 12:46 PM  Subjective: Patient without any complaints and had to strain removed without problems  Objective: Vital signs stable afebrile no acute distress patient looks well CT reviewed with radiology labs stable  Assessment: Probable pancreatitis as a cause of fluid collections  Plan: Okay with me to go home and followup with my partner Dr. Amedeo Plenty to decide how to follow his collections and his duodenal polyp Jeffrey Castillo E

## 2013-08-20 ENCOUNTER — Other Ambulatory Visit: Payer: Self-pay | Admitting: Gastroenterology

## 2013-08-20 DIAGNOSIS — K859 Acute pancreatitis without necrosis or infection, unspecified: Secondary | ICD-10-CM

## 2013-08-27 ENCOUNTER — Other Ambulatory Visit: Payer: Self-pay

## 2013-08-29 ENCOUNTER — Ambulatory Visit
Admission: RE | Admit: 2013-08-29 | Discharge: 2013-08-29 | Disposition: A | Discharge status: Home / Self Care | Payer: Medicare Other | Source: Ambulatory Visit | Attending: Gastroenterology | Admitting: Gastroenterology

## 2013-08-29 DIAGNOSIS — K859 Acute pancreatitis without necrosis or infection, unspecified: Secondary | ICD-10-CM

## 2013-08-29 MED ORDER — IOHEXOL 300 MG/ML  SOLN
100.0000 mL | Freq: Once | INTRAMUSCULAR | Status: AC | PRN
  Administered 2013-08-29: 100 mL via INTRAVENOUS

## 2013-08-30 ENCOUNTER — Other Ambulatory Visit: Payer: Self-pay | Admitting: Physician Assistant

## 2013-09-04 ENCOUNTER — Inpatient Hospital Stay (HOSPITAL_COMMUNITY)
Admission: EM | Admit: 2013-09-04 | Discharge: 2013-09-09 | DRG: 439 | Disposition: A | Discharge status: Home / Self Care | Payer: Medicare Other | Attending: Internal Medicine | Admitting: Internal Medicine

## 2013-09-04 ENCOUNTER — Encounter (HOSPITAL_COMMUNITY): Payer: Self-pay | Admitting: Emergency Medicine

## 2013-09-04 ENCOUNTER — Emergency Department (HOSPITAL_COMMUNITY): Payer: Medicare Other

## 2013-09-04 DIAGNOSIS — Z720 Tobacco use: Secondary | ICD-10-CM | POA: Diagnosis present

## 2013-09-04 DIAGNOSIS — Z800 Family history of malignant neoplasm of digestive organs: Secondary | ICD-10-CM

## 2013-09-04 DIAGNOSIS — G8929 Other chronic pain: Secondary | ICD-10-CM | POA: Diagnosis present

## 2013-09-04 DIAGNOSIS — K859 Acute pancreatitis without necrosis or infection, unspecified: Principal | ICD-10-CM | POA: Diagnosis present

## 2013-09-04 DIAGNOSIS — E876 Hypokalemia: Secondary | ICD-10-CM | POA: Diagnosis present

## 2013-09-04 DIAGNOSIS — G40909 Epilepsy, unspecified, not intractable, without status epilepticus: Secondary | ICD-10-CM | POA: Diagnosis present

## 2013-09-04 DIAGNOSIS — Z8669 Personal history of other diseases of the nervous system and sense organs: Secondary | ICD-10-CM

## 2013-09-04 DIAGNOSIS — D1330 Benign neoplasm of unspecified part of small intestine: Secondary | ICD-10-CM | POA: Diagnosis present

## 2013-09-04 DIAGNOSIS — K2990 Gastroduodenitis, unspecified, without bleeding: Secondary | ICD-10-CM

## 2013-09-04 DIAGNOSIS — I10 Essential (primary) hypertension: Secondary | ICD-10-CM | POA: Diagnosis present

## 2013-09-04 DIAGNOSIS — D72829 Elevated white blood cell count, unspecified: Secondary | ICD-10-CM | POA: Diagnosis present

## 2013-09-04 DIAGNOSIS — K2970 Gastritis, unspecified, without bleeding: Secondary | ICD-10-CM | POA: Diagnosis present

## 2013-09-04 DIAGNOSIS — R1084 Generalized abdominal pain: Secondary | ICD-10-CM | POA: Diagnosis not present

## 2013-09-04 DIAGNOSIS — Z8249 Family history of ischemic heart disease and other diseases of the circulatory system: Secondary | ICD-10-CM

## 2013-09-04 DIAGNOSIS — K560 Paralytic ileus: Secondary | ICD-10-CM | POA: Diagnosis present

## 2013-09-04 DIAGNOSIS — J449 Chronic obstructive pulmonary disease, unspecified: Secondary | ICD-10-CM | POA: Diagnosis present

## 2013-09-04 DIAGNOSIS — G589 Mononeuropathy, unspecified: Secondary | ICD-10-CM | POA: Diagnosis present

## 2013-09-04 DIAGNOSIS — K863 Pseudocyst of pancreas: Secondary | ICD-10-CM | POA: Diagnosis present

## 2013-09-04 DIAGNOSIS — K861 Other chronic pancreatitis: Secondary | ICD-10-CM | POA: Diagnosis present

## 2013-09-04 DIAGNOSIS — F17200 Nicotine dependence, unspecified, uncomplicated: Secondary | ICD-10-CM | POA: Diagnosis present

## 2013-09-04 DIAGNOSIS — F1010 Alcohol abuse, uncomplicated: Secondary | ICD-10-CM | POA: Diagnosis present

## 2013-09-04 DIAGNOSIS — K862 Cyst of pancreas: Secondary | ICD-10-CM | POA: Diagnosis present

## 2013-09-04 DIAGNOSIS — D132 Benign neoplasm of duodenum: Secondary | ICD-10-CM | POA: Diagnosis present

## 2013-09-04 DIAGNOSIS — J4489 Other specified chronic obstructive pulmonary disease: Secondary | ICD-10-CM | POA: Diagnosis present

## 2013-09-04 DIAGNOSIS — G40309 Generalized idiopathic epilepsy and epileptic syndromes, not intractable, without status epilepticus: Secondary | ICD-10-CM

## 2013-09-04 DIAGNOSIS — Z79899 Other long term (current) drug therapy: Secondary | ICD-10-CM

## 2013-09-04 HISTORY — DX: Benign neoplasm of duodenum: D13.2

## 2013-09-04 LAB — COMPREHENSIVE METABOLIC PANEL
ALT: 6 U/L (ref 0–53)
AST: 15 U/L (ref 0–37)
Albumin: 3.5 g/dL (ref 3.5–5.2)
Alkaline Phosphatase: 73 U/L (ref 39–117)
BUN: 11 mg/dL (ref 6–23)
CO2: 23 mEq/L (ref 19–32)
Calcium: 10.5 mg/dL (ref 8.4–10.5)
Chloride: 103 mEq/L (ref 96–112)
Creatinine, Ser: 0.72 mg/dL (ref 0.50–1.35)
GFR calc Af Amer: >90 mL/min (ref >90)
GFR calc non Af Amer: >90 mL/min (ref >90)
Glucose, Bld: 108 mg/dL — ABNORMAL HIGH (ref 70–99)
Potassium: 4.4 mEq/L (ref 3.7–5.3)
Sodium: 140 mEq/L (ref 137–147)
Total Bilirubin: 0.4 mg/dL (ref 0.3–1.2)
Total Protein: 7.1 g/dL (ref 6.0–8.3)

## 2013-09-04 LAB — CBC WITH DIFFERENTIAL/PLATELET
Basophils Absolute: 0 10*3/uL (ref 0.0–0.1)
Basophils Relative: 0 % (ref 0–1)
Eosinophils Absolute: 0.1 10*3/uL (ref 0.0–0.7)
Eosinophils Relative: 1 % (ref 0–5)
HCT: 42.6 % (ref 39.0–52.0)
Hemoglobin: 14.2 g/dL (ref 13.0–17.0)
Lymphocytes Relative: 16 % (ref 12–46)
Lymphs Abs: 1.6 10*3/uL (ref 0.7–4.0)
MCH: 32.1 pg (ref 26.0–34.0)
MCHC: 33.3 g/dL (ref 30.0–36.0)
MCV: 96.4 fL (ref 78.0–100.0)
Monocytes Absolute: 0.7 10*3/uL (ref 0.1–1.0)
Monocytes Relative: 7 % (ref 3–12)
Neutro Abs: 7.6 10*3/uL (ref 1.7–7.7)
Neutrophils Relative %: 76 % (ref 43–77)
Platelets: 240 10*3/uL (ref 150–400)
RBC: 4.42 MIL/uL (ref 4.22–5.81)
RDW: 13.5 % (ref 11.5–15.5)
WBC: 10 10*3/uL (ref 4.0–10.5)

## 2013-09-04 LAB — URINALYSIS, ROUTINE W REFLEX MICROSCOPIC
Bilirubin Urine: NEGATIVE
Glucose, UA: NEGATIVE mg/dL
Hgb urine dipstick: NEGATIVE
Ketones, ur: NEGATIVE mg/dL
Leukocytes, UA: NEGATIVE
Nitrite: NEGATIVE
Protein, ur: NEGATIVE mg/dL
Specific Gravity, Urine: 1.018 (ref 1.005–1.030)
Urobilinogen, UA: 0.2 mg/dL (ref 0.0–1.0)
pH: 6 (ref 5.0–8.0)

## 2013-09-04 LAB — LACTIC ACID, PLASMA: Lactic Acid, Venous: 0.8 mmol/L (ref 0.5–2.2)

## 2013-09-04 LAB — LIPASE, BLOOD: Lipase: >3000 U/L — ABNORMAL HIGH (ref 11–59)

## 2013-09-04 MED ORDER — ACETAMINOPHEN 650 MG RE SUPP: 650.0000 mg | Freq: Four times a day (QID) | RECTAL | Status: DC | PRN

## 2013-09-04 MED ORDER — MORPHINE SULFATE 2 MG/ML IJ SOLN
2.0000 mg | INTRAMUSCULAR | Status: DC | PRN
  Administered 2013-09-05 – 2013-09-06 (×5): 2 mg via INTRAVENOUS
  Filled 2013-09-04 (×5): qty 1

## 2013-09-04 MED ORDER — IOHEXOL 300 MG/ML  SOLN
50.0000 mL | Freq: Once | INTRAMUSCULAR | Status: AC | PRN
  Administered 2013-09-04: 50 mL via ORAL

## 2013-09-04 MED ORDER — ONDANSETRON HCL 4 MG/2ML IJ SOLN: 4.0000 mg | Freq: Four times a day (QID) | INTRAMUSCULAR | Status: DC | PRN

## 2013-09-04 MED ORDER — CLONIDINE HCL 0.2 MG/24HR TD PTWK
0.2000 mg | MEDICATED_PATCH | TRANSDERMAL | Status: DC
  Administered 2013-09-07: 0.2 mg via TRANSDERMAL
  Filled 2013-09-04 (×3): qty 1

## 2013-09-04 MED ORDER — SODIUM CHLORIDE 0.9 % IV SOLN
500.0000 mg | Freq: Two times a day (BID) | INTRAVENOUS | Status: DC
  Administered 2013-09-04 – 2013-09-08 (×9): 500 mg via INTRAVENOUS
  Filled 2013-09-04 (×12): qty 5

## 2013-09-04 MED ORDER — SODIUM CHLORIDE 0.9 % IV SOLN
INTRAVENOUS | Status: DC
  Administered 2013-09-05 – 2013-09-06 (×3): via INTRAVENOUS

## 2013-09-04 MED ORDER — SODIUM CHLORIDE 0.9 % IV BOLUS (SEPSIS)
1000.0000 mL | Freq: Once | INTRAVENOUS | Status: AC
  Administered 2013-09-04: 1000 mL via INTRAVENOUS

## 2013-09-04 MED ORDER — PANTOPRAZOLE SODIUM 40 MG IV SOLR
40.0000 mg | Freq: Two times a day (BID) | INTRAVENOUS | Status: DC
  Administered 2013-09-04 – 2013-09-09 (×10): 40 mg via INTRAVENOUS
  Filled 2013-09-04 (×11): qty 40

## 2013-09-04 MED ORDER — IOHEXOL 300 MG/ML  SOLN
100.0000 mL | Freq: Once | INTRAMUSCULAR | Status: AC | PRN
  Administered 2013-09-04: 100 mL via INTRAVENOUS

## 2013-09-04 MED ORDER — ACETAMINOPHEN 325 MG PO TABS
650.0000 mg | ORAL_TABLET | Freq: Four times a day (QID) | ORAL | Status: DC | PRN
  Administered 2013-09-07: 650 mg via ORAL
  Filled 2013-09-04: qty 2

## 2013-09-04 MED ORDER — HYDROMORPHONE HCL PF 1 MG/ML IJ SOLN
1.0000 mg | INTRAMUSCULAR | Status: AC | PRN
  Administered 2013-09-05: 1 mg via INTRAVENOUS
  Filled 2013-09-04: qty 1

## 2013-09-04 MED ORDER — SODIUM CHLORIDE 0.9 % IV SOLN
INTRAVENOUS | Status: AC
  Administered 2013-09-04: 21:00:00 via INTRAVENOUS

## 2013-09-04 MED ORDER — ONDANSETRON HCL 4 MG/2ML IJ SOLN: 4.0000 mg | Freq: Three times a day (TID) | INTRAMUSCULAR | Status: AC | PRN

## 2013-09-04 MED ORDER — ONDANSETRON HCL 4 MG PO TABS: 4.0000 mg | ORAL_TABLET | Freq: Four times a day (QID) | ORAL | Status: DC | PRN

## 2013-09-04 MED ORDER — MORPHINE SULFATE 4 MG/ML IJ SOLN
4.0000 mg | Freq: Once | INTRAMUSCULAR | Status: AC
  Administered 2013-09-04: 4 mg via INTRAVENOUS
  Filled 2013-09-04: qty 1

## 2013-09-04 MED ORDER — LAMOTRIGINE 25 MG PO TABS
50.0000 mg | ORAL_TABLET | Freq: Two times a day (BID) | ORAL | Status: DC
  Administered 2013-09-04 – 2013-09-09 (×10): 50 mg via ORAL
  Filled 2013-09-04 (×11): qty 2

## 2013-09-04 MED ORDER — ENOXAPARIN SODIUM 40 MG/0.4ML ~~LOC~~ SOLN
40.0000 mg | SUBCUTANEOUS | Status: DC
  Administered 2013-09-04 – 2013-09-08 (×5): 40 mg via SUBCUTANEOUS
  Filled 2013-09-04 (×6): qty 0.4

## 2013-09-04 NOTE — ED Notes (Signed)
abd pain since 2am; denies n/v; mid abd pain

## 2013-09-04 NOTE — ED Notes (Signed)
Sent to ER from PCP
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
## 2013-09-04 NOTE — ED Provider Notes (Signed)
CSN: [id number]     Arrival date & time 09/04/13  1356 History   First MD Initiated Contact with Patient 09/04/13 1658     Chief Complaint  Patient presents with  . Abdominal Pain     (Consider location/radiation/quality/duration/timing/severity/associated sxs/prior Treatment) HPI Comments: Patient is a 63 year old male with a past medical history of multiple pancreatic pseudocysts, duodenal ulcer, edema in distal sigmoid colon mucosa, COPD, seizures and hypertension who presents to the emergency department with his wife from his primary care 44 office complaining of sudden onset lower abdominal pain beginning at 2:00 this morning waking him up from sleep. Pain described as sharp, cramping and severe, constant. No specific aggravating or alleviating factors. Denies nausea, vomiting, diarrhea or fever. He was admitted to the hospital last month for a duodenal ulcer perforation. States he has been feeling "perfectly fine" up until 2:00 this morning. He had spaghetti for dinner last night. He no longer drinks alcohol.  Patient is a 62 y.o. male presenting with abdominal pain. The history is provided by the patient.  Abdominal Pain   Past Medical History  Diagnosis Date  . HYPERTENSION 07/31/2009  . Seizures   . Degenerative joint disease of low back   . Gait disorder   . Neuropathy   . Complex regional pain syndrome of left lower extremity   . Osteopenia   . Memory disorder   . Right humeral fracture   . Essential tremor   . Alcohol abuse   . COPD (chronic obstructive pulmonary disease)    Past Surgical History  Procedure Laterality Date  . Foot surgery      2 occasions  . Shoulder surgery Right   . Vasectomy    . Bladder leision    . Bunionectomy    . Lumbar spine surgery      2 occasions for left L5 radiculopathy  . Cervical spine surgery  08/2010    C5-6-7  . Spinal cord stimulator implant  02/2011  . Esophagogastroduodenoscopy N/A 08/05/2013    Procedure:  ESOPHAGOGASTRODUODENOSCOPY (EGD);  Surgeon: Jeryl Columbia, MD;  Location: [facility];  Service: Endoscopy;  Laterality: N/A;   Family History  Problem Relation Age of Onset  . Colon cancer Father   . Heart disease Brother    History  Substance Use Topics  . Smoking status: Current Every Day Smoker -- 1.00 packs/day    Types: Cigarettes  . Smokeless tobacco: Never Used  . Alcohol Use: Yes     Comment: 07/17/13 was last drink    Review of Systems  Gastrointestinal: Positive for abdominal pain.  All other systems reviewed and are negative.      Allergies  Lyrica; Sulfonamide derivatives; and Valsartan  Home Medications   Current Outpatient Rx  Name  Route  Sig  Dispense  Refill  . bacitracin-polymyxin b (POLYSPORIN) ointment   Topical   Apply 1 application topically daily as needed.         . cloNIDine (CATAPRES - DOSED IN MG/24 HR) 0.2 mg/24hr patch   Transdermal   Place 1 patch onto the skin once a week.         . lamoTRIgine (LAMICTAL) 25 MG tablet   Oral   Take 50 mg by mouth 2 (two) times daily.         Marland Kitchen levETIRAcetam (KEPPRA) 500 MG tablet   Oral   Take 500 mg by mouth 2 (two) times daily.         . naproxen sodium (  ANAPROX) 220 MG tablet   Oral   Take 220 mg by mouth once as needed (pain).         . pantoprazole (PROTONIX) 40 MG tablet   Oral   Take 1 tablet (40 mg total) by mouth 2 (two) times daily.   60 tablet   1   . sildenafil (VIAGRA) 50 MG tablet   Oral   Take 50 mg by mouth daily as needed for erectile dysfunction.          BP 150/90  Pulse 78  Temp(Src) 98.4 F (36.9 C) (Oral)  Resp 16  SpO2 96% Physical Exam  Nursing note and vitals reviewed. Constitutional: He is oriented to person, place, and time. He appears well-developed and well-nourished. No distress.  HENT:  Head: Normocephalic and atraumatic.  Mouth/Throat: Oropharynx is clear and moist.  Eyes: Conjunctivae are normal.  Neck: Normal range of motion. Neck  supple.  Cardiovascular: Normal rate, regular rhythm and normal heart sounds.   Pulmonary/Chest: Effort normal and breath sounds normal.  Abdominal: Soft. Bowel sounds are normal. He exhibits no distension. There is generalized tenderness. There is guarding. There is no rigidity and no rebound.  Generalized severe tenderness of abdomen, worse in the lower aspect to light palpation.  Musculoskeletal: Normal range of motion. He exhibits no edema.  Neurological: He is alert and oriented to person, place, and time.  Skin: Skin is warm and dry. He is not diaphoretic.  Psychiatric: He has a normal mood and affect. His behavior is normal.    ED Course  Procedures (including critical care time) Labs Review Labs Reviewed  COMPREHENSIVE METABOLIC PANEL - Abnormal; Notable for the following:    Glucose, Bld 108 (*)    All other components within normal limits  LIPASE, BLOOD - Abnormal; Notable for the following:    Lipase >3000 (*)    All other components within normal limits  CBC WITH DIFFERENTIAL  URINALYSIS, ROUTINE W REFLEX MICROSCOPIC  LACTIC ACID, PLASMA   Imaging Review Ct Abdomen Pelvis W Contrast  09/04/2013   CLINICAL DATA:  Mid abdominal pain with nausea.  EXAM: CT ABDOMEN AND PELVIS WITH CONTRAST  TECHNIQUE: Multidetector CT imaging of the abdomen and pelvis was performed using the standard protocol following bolus administration of intravenous contrast.  CONTRAST:  26mL OMNIPAQUE IOHEXOL 300 MG/ML SOLN, 144mL OMNIPAQUE IOHEXOL 300 MG/ML SOLN  COMPARISON:  08/29/2013  FINDINGS: Since the prior study, inflammatory changes surround the pancreas have increased. A lobulated cyst within the constant process and pancreatic head is mildly larger, currently measuring 2.6 cm x 2.4 cm where it had measured 2.3 cm x 2.3 cm. There are multiple irregular collections that are noted within the mesentery, adjacent to the greater curvature of the stomach, along the central small bowel mesentery, more  inferiorly within the small bowel mesentery, and in the retroperitoneum posterior inferior to the right kidney and below the left kidney adjacent to the left psoas muscle. These are essentially stable. No new collection/pseudocyst. No generalized ascites.  Atelectasis at the left lung base has increased from the prior study there is milder stable atelectasis at the posterior right lung base.  Small low-density liver lesions at the dome of the liver are stable, likely cysts. Liver is otherwise unremarkable. Normal spleen.  Gallstones and sludge are evident, also stable. No gallbladder wall thickening or evidence acute cholecystitis. There is no bile duct dilation.  Mild adrenal gland nodularity most likely mild nodular hyperplasia. This is stable.  Stable  renal cyst. Kidneys are otherwise unremarkable. No hydronephrosis.  There are multiple colonic diverticula mostly along the sigmoid colon. No diverticulitis or colonic inflammation. Small bowel is mildly prominent with air-fluid levels consistent with a mild adynamic ileus. Normal appendix.  There are dense aortoiliac calcifications. There are significant degenerative changes throughout the visualized spine. No osteoblastic or osteolytic lesions.  IMPRESSION: 1. Inflammatory changes surrounding the pancreas have increased from prior study consistent with recurrent, mild, acute pancreatitis complicating changes from subacute to chronic pancreatitis. There is a mild associated small bowel adynamic ileus reflected by small bowel prominence in air-fluid levels. 2. A presumed pseudocyst in the pancreatic head and uncinate process has mildly increased in size. Other irregular collections consistent with additional pseudocysts in the peritoneal cavity and both retroperitoneum are essentially stable. 3. Increased atelectasis in the left lung base since the prior study. 4. No other change from the prior study. Chronic findings include gallstones and sludge, low-density liver  lesions that are likely cysts, renal cysts, colonic diverticula without diverticulitis and significant degenerative changes of the visualized spine.   Electronically Signed   By: Lajean Manes M.D.   On: 09/04/2013 18:52    EKG Interpretation   None       MDM   Final diagnoses:  Acute pancreatitis   Patient presenting with acute onset abdominal pain without any other associated symptoms. He appears in no apparent distress. Recent admission for perforated duodenal ulcer. Abdomen tender to light palpation with guarding. Labs pending, CT with contrast pending. NPO. 7:28 PM Lipase greater than 3000. CT scan showing increased inflammatory changes surrounding the pancreas compared to prior study. Patient reports his pain is completely improved after receiving morphine. He as received 2 L of fluids. He'll be admitted for acute pancreatitis and worsening CT scan. Admission accepted by Dr. Clementeen Graham, [facility]. Case discussed with attending Dr. Aline Brochure who agrees with plan of care.   Illene Labrador, PA-C 09/04/13 1929

## 2013-09-04 NOTE — ED Notes (Signed)
Patient transported to CT 

## 2013-09-04 NOTE — ED Provider Notes (Signed)
Medical screening examination/treatment/procedure(s) were performed by non-physician practitioner and as supervising physician I was immediately available for consultation/collaboration.      Blanchard Kelch, MD 09/04/13 2303

## 2013-09-04 NOTE — Progress Notes (Signed)
Report received from Moss Mc, rn in ED. Vwilliams,rn.

## 2013-09-05 ENCOUNTER — Inpatient Hospital Stay (HOSPITAL_COMMUNITY): Payer: Medicare Other

## 2013-09-05 DIAGNOSIS — G40309 Generalized idiopathic epilepsy and epileptic syndromes, not intractable, without status epilepticus: Secondary | ICD-10-CM

## 2013-09-05 DIAGNOSIS — K863 Pseudocyst of pancreas: Secondary | ICD-10-CM

## 2013-09-05 DIAGNOSIS — K862 Cyst of pancreas: Secondary | ICD-10-CM

## 2013-09-05 LAB — BASIC METABOLIC PANEL
BUN: 8 mg/dL (ref 6–23)
CO2: 23 mEq/L (ref 19–32)
Calcium: 9 mg/dL (ref 8.4–10.5)
Chloride: 104 mEq/L (ref 96–112)
Creatinine, Ser: 0.66 mg/dL (ref 0.50–1.35)
GFR calc Af Amer: >90 mL/min (ref >90)
GFR calc non Af Amer: >90 mL/min (ref >90)
Glucose, Bld: 79 mg/dL (ref 70–99)
Potassium: 3.9 mEq/L (ref 3.7–5.3)
Sodium: 139 mEq/L (ref 137–147)

## 2013-09-05 LAB — LIPASE, BLOOD: Lipase: 1816 U/L — ABNORMAL HIGH (ref 11–59)

## 2013-09-05 MED ORDER — NICOTINE 21 MG/24HR TD PT24
21.0000 mg | MEDICATED_PATCH | Freq: Every day | TRANSDERMAL | Status: DC
  Administered 2013-09-05 – 2013-09-08 (×4): 21 mg via TRANSDERMAL
  Filled 2013-09-05 (×6): qty 1

## 2013-09-05 NOTE — Consult Note (Signed)
[facility] Reason for consult: Pancreatitis Referring Physician: [facility]. PCP: Dr. Loney Hering. Primary G.I.: Dr. Jeri Modena is an 63 y.o. male.  HPI: he has colon polyps and gets routine colonoscopies by Dr. Amedeo Plenty. These are up-to-date. He has a history of seizures and takes Dilantin and has a history of neuropathy as well. He has had a gait disorder and tremors. He has developed chronic pain and has a implantable stimulator for control of his pain. He has had a history of alcohol use reports that are proximal in the past 8 months he has had episodic pain. He describes his episodes of pain is beginning in his right flank and rotating to the middle. He was hospitalized 1/15 with severe pain and CT scan showed inflammatory process and there was a question of possible perforated ulcer. He was seen by surgery. It was felt in he did not need an operation he got better. There was a small cystic lesion noted in the pancreas in CT, follow-up was recommended. Apparently the question was if the pain was a result of pancreatitis or minor perforated ulcer. Patient reports to me that he has had no alcohol at all since that time and felt much better for about 10 days with recurrent abdominal pain. In this admission as lipase was normal. He was found to have a retroperitoneal fluid collection in the right this fluid was trained and had no WBC use or organism is seen in culture was negative. Patient actually had several intra-abdominal fluid collections. He also had cholelthiasis. It's noted that 60 mL of purulent fluid were trying to white count in the fluid apparently was normal. Order to answer the question about a perforated ulcer EGD was performed by Dr. Watt Climes with no signs of ulcer but the finding of a large villous polyp in the 2nd duodenum. The patient was supposed to follow-up with Dr. Amedeo Plenty in the office to arrange EUS and more extensive biopsy of this lesion. He went home and  did well for a few days and then began to have pain again. He had been discharged home on PPI, Cipro, and Flagyl. He reports at this bout of pain was somewhat different. This 4 or 5 other episodes of pain began in the right flank and radiated into the front of the abdomen. This episode pain began in the front and has remained. Repeat CT scan again shows gallbladder sludge in gallstones, cystic structure in the pancreatic head now approximately 3 cm in diameter slightly larger. Still with multiple fluid collections within the mesentery. The work inflammatory changes around the pancreas that appeared to be increased from previous CT this was felt to be a pseudocyst of the pancreas. Labs were remarkable for a lipase greater than 3000, normal liver test. WBC was normal. We were asked to see the patient for help managing his pancreatitis.  Past Medical History  Diagnosis Date  . HYPERTENSION 07/31/2009  . Seizures   . Degenerative joint disease of low back   . Gait disorder   . Neuropathy   . Complex regional pain syndrome of left lower extremity   . Osteopenia   . Memory disorder   . Right humeral fracture   . Essential tremor   . Alcohol abuse   . COPD (chronic obstructive pulmonary disease)     Past Surgical History  Procedure Laterality Date  . Foot surgery      2 occasions  . Shoulder surgery Right   . Vasectomy    .  Bladder leision    . Bunionectomy    . Lumbar spine surgery      2 occasions for left L5 radiculopathy  . Cervical spine surgery  08/2010    C5-6-7  . Spinal cord stimulator implant  02/2011  . Esophagogastroduodenoscopy N/A 08/05/2013    Procedure: ESOPHAGOGASTRODUODENOSCOPY (EGD);  Surgeon: Jeryl Columbia, MD;  Location: [facility];  Service: Endoscopy;  Laterality: N/A;    Family History  Problem Relation Age of Onset  . Colon cancer Father   . Heart disease Brother     Social History:  reports that he has been smoking Cigarettes.  He has a 50 pack-year smoking  history. He has never used smokeless tobacco. He reports that he drinks alcohol. He reports that he does not use illicit drugs.  Allergies:  Allergies  Allergen Reactions  . Lyrica [Pregabalin] Other (See Comments)    Hallucinations.    . Sulfonamide Derivatives Other (See Comments)    unknown as a child  . Valsartan Other (See Comments)    lightheaded    Medications; Prior to Admission medications   Medication Sig Start Date End Date Taking? Authorizing Provider  bacitracin-polymyxin b (POLYSPORIN) ointment Apply 1 application topically daily as needed.   Yes Historical Provider, MD  cloNIDine (CATAPRES - DOSED IN MG/24 HR) 0.2 mg/24hr patch Place 1 patch onto the skin once a week.   Yes Historical Provider, MD  lamoTRIgine (LAMICTAL) 25 MG tablet Take 50 mg by mouth 2 (two) times daily.   Yes Historical Provider, MD  levETIRAcetam (KEPPRA) 500 MG tablet Take 500 mg by mouth 2 (two) times daily.   Yes Historical Provider, MD  naproxen sodium (ANAPROX) 220 MG tablet Take 220 mg by mouth once as needed (pain).   Yes Historical Provider, MD  pantoprazole (PROTONIX) 40 MG tablet Take 1 tablet (40 mg total) by mouth 2 (two) times daily. 07/23/13  Yes Orson Eva, MD  sildenafil (VIAGRA) 50 MG tablet Take 50 mg by mouth daily as needed for erectile dysfunction.   Yes Historical Provider, MD   . cloNIDine  0.2 mg Transdermal Weekly  . enoxaparin (LOVENOX) injection  40 mg Subcutaneous Q24H  . lamoTRIgine  50 mg Oral BID  . levETIRAcetam  500 mg Intravenous Q12H  . pantoprazole (PROTONIX) IV  40 mg Intravenous Q12H   PRN Meds acetaminophen, acetaminophen, morphine injection, ondansetron (ZOFRAN) IV, ondansetron Results for orders placed during the hospital encounter of 09/04/13 (from the past 48 hour(s))  CBC WITH DIFFERENTIAL     Status: None   Collection Time    09/04/13  2:55 PM      Result Value Ref Range   WBC 10.0  4.0 - 10.5 K/uL   RBC 4.42  4.22 - 5.81 MIL/uL   Hemoglobin 14.2   13.0 - 17.0 g/dL   HCT 42.6  39.0 - 52.0 %   MCV 96.4  78.0 - 100.0 fL   MCH 32.1  26.0 - 34.0 pg   MCHC 33.3  30.0 - 36.0 g/dL   RDW 13.5  11.5 - 15.5 %   Platelets 240  150 - 400 K/uL   Neutrophils Relative % 76  43 - 77 %   Neutro Abs 7.6  1.7 - 7.7 K/uL   Lymphocytes Relative 16  12 - 46 %   Lymphs Abs 1.6  0.7 - 4.0 K/uL   Monocytes Relative 7  3 - 12 %   Monocytes Absolute 0.7  0.1 - 1.0 K/uL  Eosinophils Relative 1  0 - 5 %   Eosinophils Absolute 0.1  0.0 - 0.7 K/uL   Basophils Relative 0  0 - 1 %   Basophils Absolute 0.0  0.0 - 0.1 K/uL  COMPREHENSIVE METABOLIC PANEL     Status: Abnormal   Collection Time    09/04/13  2:55 PM      Result Value Ref Range   Sodium 140  137 - 147 mEq/L   Potassium 4.4  3.7 - 5.3 mEq/L   Chloride 103  96 - 112 mEq/L   CO2 23  19 - 32 mEq/L   Glucose, Bld 108 (*) 70 - 99 mg/dL   BUN 11  6 - 23 mg/dL   Creatinine, Ser 0.72  0.50 - 1.35 mg/dL   Calcium 10.5  8.4 - 10.5 mg/dL   Total Protein 7.1  6.0 - 8.3 g/dL   Albumin 3.5  3.5 - 5.2 g/dL   AST 15  0 - 37 U/L   ALT 6  0 - 53 U/L   Alkaline Phosphatase 73  39 - 117 U/L   Total Bilirubin 0.4  0.3 - 1.2 mg/dL   GFR calc non Af Amer >90  >90 mL/min   GFR calc Af Amer >90  >90 mL/min   Comment: (NOTE)     The eGFR has been calculated using the CKD EPI equation.     This calculation has not been validated in all clinical situations.     eGFR's persistently <90 mL/min signify possible Chronic Kidney     Disease.  LIPASE, BLOOD     Status: Abnormal   Collection Time    09/04/13  2:55 PM      Result Value Ref Range   Lipase >3000 (*) 11 - 59 U/L  URINALYSIS, ROUTINE W REFLEX MICROSCOPIC     Status: None   Collection Time    09/04/13  6:25 PM      Result Value Ref Range   Color, Urine YELLOW  YELLOW   APPearance CLEAR  CLEAR   Specific Gravity, Urine 1.018  1.005 - 1.030   pH 6.0  5.0 - 8.0   Glucose, UA NEGATIVE  NEGATIVE mg/dL   Hgb urine dipstick NEGATIVE  NEGATIVE   Bilirubin  Urine NEGATIVE  NEGATIVE   Ketones, ur NEGATIVE  NEGATIVE mg/dL   Protein, ur NEGATIVE  NEGATIVE mg/dL   Urobilinogen, UA 0.2  0.0 - 1.0 mg/dL   Nitrite NEGATIVE  NEGATIVE   Leukocytes, UA NEGATIVE  NEGATIVE   Comment: MICROSCOPIC NOT DONE ON URINES WITH NEGATIVE PROTEIN, BLOOD, LEUKOCYTES, NITRITE, OR GLUCOSE <1000 mg/dL.  LACTIC ACID, PLASMA     Status: None   Collection Time    09/04/13  9:40 PM      Result Value Ref Range   Lactic Acid, Venous 0.8  0.5 - 2.2 mmol/L  BASIC METABOLIC PANEL     Status: None   Collection Time    09/05/13  3:35 AM      Result Value Ref Range   Sodium 139  137 - 147 mEq/L   Potassium 3.9  3.7 - 5.3 mEq/L   Chloride 104  96 - 112 mEq/L   CO2 23  19 - 32 mEq/L   Glucose, Bld 79  70 - 99 mg/dL   BUN 8  6 - 23 mg/dL   Creatinine, Ser 0.66  0.50 - 1.35 mg/dL   Calcium 9.0  8.4 - 10.5 mg/dL   GFR calc  non Af Amer >90  >90 mL/min   GFR calc Af Amer >90  >90 mL/min   Comment: (NOTE)     The eGFR has been calculated using the CKD EPI equation.     This calculation has not been validated in all clinical situations.     eGFR's persistently <90 mL/min signify possible Chronic Kidney     Disease.  LIPASE, BLOOD     Status: Abnormal   Collection Time    09/05/13  3:35 AM      Result Value Ref Range   Lipase 1816 (*) 11 - 59 U/L    Ct Abdomen Pelvis W Contrast  09/04/2013   CLINICAL DATA:  Mid abdominal pain with nausea.  EXAM: CT ABDOMEN AND PELVIS WITH CONTRAST  TECHNIQUE: Multidetector CT imaging of the abdomen and pelvis was performed using the standard protocol following bolus administration of intravenous contrast.  CONTRAST:  63m OMNIPAQUE IOHEXOL 300 MG/ML SOLN, 1056mOMNIPAQUE IOHEXOL 300 MG/ML SOLN  COMPARISON:  08/29/2013  FINDINGS: Since the prior study, inflammatory changes surround the pancreas have increased. A lobulated cyst within the constant process and pancreatic head is mildly larger, currently measuring 2.6 cm x 2.4 cm where it had  measured 2.3 cm x 2.3 cm. There are multiple irregular collections that are noted within the mesentery, adjacent to the greater curvature of the stomach, along the central small bowel mesentery, more inferiorly within the small bowel mesentery, and in the retroperitoneum posterior inferior to the right kidney and below the left kidney adjacent to the left psoas muscle. These are essentially stable. No new collection/pseudocyst. No generalized ascites.  Atelectasis at the left lung base has increased from the prior study there is milder stable atelectasis at the posterior right lung base.  Small low-density liver lesions at the dome of the liver are stable, likely cysts. Liver is otherwise unremarkable. Normal spleen.  Gallstones and sludge are evident, also stable. No gallbladder wall thickening or evidence acute cholecystitis. There is no bile duct dilation.  Mild adrenal gland nodularity most likely mild nodular hyperplasia. This is stable.  Stable renal cyst. Kidneys are otherwise unremarkable. No hydronephrosis.  There are multiple colonic diverticula mostly along the sigmoid colon. No diverticulitis or colonic inflammation. Small bowel is mildly prominent with air-fluid levels consistent with a mild adynamic ileus. Normal appendix.  There are dense aortoiliac calcifications. There are significant degenerative changes throughout the visualized spine. No osteoblastic or osteolytic lesions.  IMPRESSION: 1. Inflammatory changes surrounding the pancreas have increased from prior study consistent with recurrent, mild, acute pancreatitis complicating changes from subacute to chronic pancreatitis. There is a mild associated small bowel adynamic ileus reflected by small bowel prominence in air-fluid levels. 2. A presumed pseudocyst in the pancreatic head and uncinate process has mildly increased in size. Other irregular collections consistent with additional pseudocysts in the peritoneal cavity and both retroperitoneum  are essentially stable. 3. Increased atelectasis in the left lung base since the prior study. 4. No other change from the prior study. Chronic findings include gallstones and sludge, low-density liver lesions that are likely cysts, renal cysts, colonic diverticula without diverticulitis and significant degenerative changes of the visualized spine.   Electronically Signed   By: DaLajean Manes.D.   On: 09/04/2013 18:52               Blood pressure 143/74, pulse 79, temperature 97.5 F (36.4 C), temperature source Axillary, resp. rate 16, height 5' 7"  (1.702 m), weight 68.04 kg (150  lb), SpO2 96.00%.  Physical exam:   General-- pleasant alert white male no acute distress Heart-- regular rate and rhythm without murmurs are gallops Lungs--grossly clear Abdomen-- slight distended with mild epigastric tenderness few bowel sounds   Assessment: 1. Acute pancreatitis. Patient clearly has a history of alcohol abuse by his history has not been drinking now for nearly 2 months. He's persistently had gallstones and gallbladder sludge in CT scans. It does appear that he is developing a pseudocyst. 2. Large Villous Adenoma Of the Duodenum. This could certainly be contributing to his pancreatitis and will need to be dealt with. He likely has donated procedure to remove this. 3. Persistent gallstones and gallbladder sludge 4. 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 History of alcohol abuse none times nearly 2 months  Plan: 1. We'll go ahead and obtain ultrasound of the gallbladder pancreas. 2. Would continue conservative supportive therapy for pancreatitis and pseudocyst for now. 3. Patient may need EUS and biopsy, evaluation of the small bowel for other polyps, surgical resection of gallbladder and duodenal adenoma.   Meara Wiechman JR,Silvio Sausedo L 09/05/2013, 3:33 PM

## 2013-09-05 NOTE — Progress Notes (Signed)
Received referral for [facility] on behalf of [facility] program. Unable to accept referral at this time due to patient's primary insurance is [employer] not [facility] provider. Will make inpatient RNCM aware. Marthenia Rolling, MSN- RN, [phone]

## 2013-09-05 NOTE — Progress Notes (Signed)
PROGRESS NOTE   Jeffrey Castillo ZDG:[id number] DOB: Jul 24, 1950 DOA: 09/04/2013 PCP: Rozanna Box, MD  Brief narrative: Jeffrey Castillo is an 63 y.o. male with a PMH of chronic pancreatitis with pancreatic pseudocyst, history of alcohol abuse, seizure disorder, hypertension, active tobacco abuse, recent hospitalization 08/01/13-08/07/13 for treatment of suspected perforated duodenal ulcer with intra-abdominal abscess treated with percutaneous drainage and a seven-day course of ertapenem followed by a seven-day course of Cipro/Flagyl (total treatment course with antibiotics 14 days), who was admitted 09/04/13 with acute pancreatitis. Upon initial evaluation in the ED, the patient's lipase levels were found to be greater than 3000. CT scan of the abdomen and pelvis showed a mild increase in size of his pseudocyst with other irregular collections consistent with additional pseudocysts in the peritoneal cavity and retroperitoneum.  Assessment/Plan: Principal Problem:   Acute pancreatitis / Pancreatic pseudocyst The patient was admitted and placed on bowel rest. Continue anti-emetics and pain management as well as supportive care with IV fluids. Lipase 3000--->1816.  Will ask GI to see, given his recent hospitalization and complicated pancreatitis issues. Active Problems:   C O P D Stable at this time.   SEIZURE DISORDER, HX OF Continue Lamictal and Keppra.    Gastritis Continue Protonix.   Tobacco abuse Counseled. Can provide with nicotine patch if needed.    DVT Prophylaxis  Code Status: Full. Family Communication: No family at bedside. Disposition Plan: Home when stable.   IV access:  Peripheral IV  Medical Consultants:  None.  Other Consultants:  Gastroenterology  Anti-infectives:  None.  HPI/Subjective: Jeffrey Castillo continues to have abdominal pain and some nausea but no vomiting. Passing flatus. Denies any alcohol intake since 08/01/13, when he was admitted  previously.  Objective: Filed Vitals:   09/04/13 2024 09/04/13 2040 09/05/13 0045 09/05/13 0538  BP: 160/83 159/88  145/80  Pulse:  83  74  Temp: 99.1 F (37.3 C) 99.2 F (37.3 C)  99.3 F (37.4 C)  TempSrc: Oral Oral  Oral  Resp: 20 18  18   Height:   5\' 7"  (1.702 m)   SpO2: 100% 99%  98%    Intake/Output Summary (Last 24 hours) at 09/05/13 0716 Last data filed at 09/05/13 0601  Gross per 24 hour  Intake    890 ml  Output    250 ml  Net    640 ml    Exam: Gen:  NAD Cardiovascular:  RRR, No M/R/G Respiratory:  Lungs CTAB Gastrointestinal:  Abdomen soft, diffusely tender, + BS Extremities:  No C/E/C  Data Reviewed: Basic Metabolic Panel:  Recent Labs Lab 09/04/13 1455 09/05/13 0335  NA 140 139  K 4.4 3.9  CL 103 104  CO2 23 23  GLUCOSE 108* 79  BUN 11 8  CREATININE 0.72 0.66  CALCIUM 10.5 9.0   GFR The CrCl is unknown because both a height and weight (above a minimum accepted value) are required for this calculation. Liver Function Tests:  Recent Labs Lab 09/04/13 1455  AST 15  ALT 6  ALKPHOS 73  BILITOT 0.4  PROT 7.1  ALBUMIN 3.5    Recent Labs Lab 09/04/13 1455 09/05/13 0335  LIPASE >3000* 1816*   CBC:  Recent Labs Lab 09/04/13 1455  WBC 10.0  NEUTROABS 7.6  HGB 14.2  HCT 42.6  MCV 96.4  PLT 240   Microbiology No results found for this or any previous visit (from the past 240 hour(s)).   Procedures and Diagnostic Studies: Ct  Abdomen Pelvis W Contrast  09/04/2013   CLINICAL DATA:  Mid abdominal pain with nausea.  EXAM: CT ABDOMEN AND PELVIS WITH CONTRAST  TECHNIQUE: Multidetector CT imaging of the abdomen and pelvis was performed using the standard protocol following bolus administration of intravenous contrast.  CONTRAST:  50mL OMNIPAQUE IOHEXOL 300 MG/ML SOLN, OMNIPAQUE IOHEXOL 300 MG/ML SOLN  COMPARISON:  08/29/2013  FINDINGS: Since the prior study, inflammatory changes surround the pancreas have increased. A lobulated  cyst within the constant process and pancreatic head is mildly larger, currently measuring 2.6 cm x 2.4 cm where it had measured 2.3 cm x 2.3 cm. There are multiple irregular collections that are noted within the mesentery, adjacent to the greater curvature of the stomach, along the central small bowel mesentery, more inferiorly within the small bowel mesentery, and in the retroperitoneum posterior inferior to the right kidney and below the left kidney adjacent to the left psoas muscle. These are essentially stable. No new collection/pseudocyst. No generalized ascites.  Atelectasis at the left lung base has increased from the prior study there is milder stable atelectasis at the posterior right lung base.  Small low-density liver lesions at the dome of the liver are stable, likely cysts. Liver is otherwise unremarkable. Normal spleen.  Gallstones and sludge are evident, also stable. No gallbladder wall thickening or evidence acute cholecystitis. There is no bile duct dilation.  Mild adrenal gland nodularity most likely mild nodular hyperplasia. This is stable.  Stable renal cyst. Kidneys are otherwise unremarkable. No hydronephrosis.  There are multiple colonic diverticula mostly along the sigmoid colon. No diverticulitis or colonic inflammation. Small bowel is mildly prominent with air-fluid levels consistent with a mild adynamic ileus. Normal appendix.  There are dense aortoiliac calcifications. There are significant degenerative changes throughout the visualized spine. No osteoblastic or osteolytic lesions.  IMPRESSION: 1. Inflammatory changes surrounding the pancreas have increased from prior study consistent with recurrent, mild, acute pancreatitis complicating changes from subacute to chronic pancreatitis. There is a mild associated small bowel adynamic ileus reflected by small bowel prominence in air-fluid levels. 2. A presumed pseudocyst in the pancreatic head and uncinate process has mildly increased in  size. Other irregular collections consistent with additional pseudocysts in the peritoneal cavity and both retroperitoneum are essentially stable. 3. Increased atelectasis in the left lung base since the prior study. 4. No other change from the prior study. Chronic findings include gallstones and sludge, low-density liver lesions that are likely cysts, renal cysts, colonic diverticula without diverticulitis and significant degenerative changes of the visualized spine.   Electronically Signed   By: Amie Portland M.D.   On: 09/04/2013 18:52   Scheduled Meds: . sodium chloride   Intravenous STAT  . cloNIDine  0.2 mg Transdermal Weekly  . enoxaparin (LOVENOX) injection  40 mg Subcutaneous Q24H  . lamoTRIgine  50 mg Oral BID  . levETIRAcetam  500 mg Intravenous Q12H  . pantoprazole (PROTONIX) IV  40 mg Intravenous Q12H   Continuous Infusions: . sodium chloride 100 mL/hr at 09/05/13 0601    Time spent: 35 minutes with > 50% of time discussing current diagnostic test results, clinical impression and plan of care.    LOS: 1 day   Yanely Mast  [facility] Pager [phone]. If unable to reach me by pager, please call my cell phone at [phone].  *Please note that the hospitalists switch teams on Wednesdays. Please call the flow manager at [phone] if you are having difficulty reaching the hospitalist taking care of this  patient as she can update you and provide the most up-to-date pager number of provider caring for the patient. If 8PM-8AM, please contact night-coverage at [email or web address], password [facility]  09/05/2013, 7:16 AM    **Disclaimer: This note was dictated with voice recognition software. Similar sounding words can inadvertently be transcribed and this note may contain transcription errors which may not have been corrected upon publication of note.**

## 2013-09-05 NOTE — Care Management Note (Signed)
    Page 1 of 1   09/05/2013     12:42:12 PM   CARE MANAGEMENT NOTE 09/05/2013  Patient:  Jeffrey Castillo, Jeffrey Castillo   Account Number:  [id number]  Date Initiated:  09/05/2013  Documentation initiated by:  Sunday Spillers  Subjective/Objective Assessment:   62 yo male admitted with acute pancreatitis. PTA lived at home with spouse.     Action/Plan:   Home when stable   Anticipated DC Date:  09/09/2013   Anticipated DC Plan:  [place]  CM consult      Choice offered to / List presented to:             Status of service:  Completed, signed off Medicare Important Message given?   (If response is "NO", the following Medicare IM given date fields will be blank) Date Medicare IM given:   Date Additional Medicare IM given:    Discharge Disposition:  HOME/SELF CARE  Per UR Regulation:  Reviewed for med. necessity/level of care/duration of stay  If discussed at [place] of Stay Meetings, dates discussed:    Comments:

## 2013-09-06 LAB — COMPREHENSIVE METABOLIC PANEL
ALT: 6 U/L (ref 0–53)
AST: 17 U/L (ref 0–37)
Albumin: 2.7 g/dL — ABNORMAL LOW (ref 3.5–5.2)
Alkaline Phosphatase: 57 U/L (ref 39–117)
BUN: 6 mg/dL (ref 6–23)
CO2: 20 mEq/L (ref 19–32)
Calcium: 9.1 mg/dL (ref 8.4–10.5)
Chloride: 101 mEq/L (ref 96–112)
Creatinine, Ser: 0.58 mg/dL (ref 0.50–1.35)
GFR calc Af Amer: >90 mL/min (ref >90)
GFR calc non Af Amer: >90 mL/min (ref >90)
Glucose, Bld: 57 mg/dL — ABNORMAL LOW (ref 70–99)
Potassium: 3.9 mEq/L (ref 3.7–5.3)
Sodium: 137 mEq/L (ref 137–147)
Total Bilirubin: 0.5 mg/dL (ref 0.3–1.2)
Total Protein: 5.7 g/dL — ABNORMAL LOW (ref 6.0–8.3)

## 2013-09-06 LAB — GLUCOSE, CAPILLARY
Glucose-Capillary: 55 mg/dL — ABNORMAL LOW (ref 70–99)
Glucose-Capillary: 97 mg/dL (ref 70–99)
Glucose-Capillary: 85 mg/dL (ref 70–99)
Glucose-Capillary: 83 mg/dL (ref 70–99)

## 2013-09-06 LAB — CBC
HCT: 36.4 % — ABNORMAL LOW (ref 39.0–52.0)
Hemoglobin: 12.5 g/dL — ABNORMAL LOW (ref 13.0–17.0)
MCH: 32.5 pg (ref 26.0–34.0)
MCHC: 34.3 g/dL (ref 30.0–36.0)
MCV: 94.5 fL (ref 78.0–100.0)
Platelets: 194 10*3/uL (ref 150–400)
RBC: 3.85 MIL/uL — ABNORMAL LOW (ref 4.22–5.81)
RDW: 13.2 % (ref 11.5–15.5)
WBC: 8.5 10*3/uL (ref 4.0–10.5)

## 2013-09-06 MED ORDER — DEXTROSE 50 % IV SOLN
25.0000 mL | Freq: Once | INTRAVENOUS | Status: AC | PRN
  Administered 2013-09-06: 25 mL via INTRAVENOUS

## 2013-09-06 MED ORDER — DEXTROSE-NACL 5-0.9 % IV SOLN
INTRAVENOUS | Status: DC
  Administered 2013-09-06 (×2): via INTRAVENOUS

## 2013-09-06 MED ORDER — DEXTROSE 50 % IV SOLN
INTRAVENOUS | Status: AC
  Filled 2013-09-06: qty 50

## 2013-09-06 MED ORDER — MORPHINE SULFATE 2 MG/ML IJ SOLN
2.0000 mg | INTRAMUSCULAR | Status: DC | PRN
  Administered 2013-09-06: 4 mg via INTRAVENOUS
  Filled 2013-09-06: qty 2

## 2013-09-06 MED ORDER — VITAMINS A & D EX OINT
TOPICAL_OINTMENT | CUTANEOUS | Status: AC
  Administered 2013-09-06: 21:00:00
  Filled 2013-09-06: qty 5

## 2013-09-06 NOTE — Progress Notes (Signed)
Patient ID: Jeffrey Castillo, male   DOB: October 30, 1950, 63 y.o.   MRN: [id number] [facility]astroenterology Progress Note  Jeffrey Castillo 63 y.o. May 18, 1951   Subjective: Lying in bed. Abdominal pain not as severe as at admit.  Objective: Vital signs in last 24 hours: Filed Vitals:   09/06/13 0458  BP: 159/79  Pulse: 70  Temp: 98.1 F (36.7 C)  Resp: 16    Physical Exam: Gen: alert, no acute distress Abd: diffuse tenderness with guarding, soft, nondistended, +BS  Lab Results:  Recent Labs  09/05/13 0335 09/06/13 0315  NA 139 137  K 3.9 3.9  CL 104 101  CO2 23 20  GLUCOSE 79 57*  BUN 8 6  CREATININE 0.66 0.58  CALCIUM 9.0 9.1    Recent Labs  09/04/13 1455 09/06/13 0315  AST 15 17  ALT 6 6  ALKPHOS 73 57  BILITOT 0.4 0.5  PROT 7.1 5.7*  ALBUMIN 3.5 2.7*    Recent Labs  09/04/13 1455 09/06/13 0315  WBC 10.0 8.5  NEUTROABS 7.6  --   HGB 14.2 12.5*  HCT 42.6 36.4*  MCV 96.4 94.5  PLT 240 194   No results found for this basename: LABPROT, INR,  in the last 72 hours  Lipase 1816  Assessment/Plan: 63 yo with pancreatitis complicated by pseudocyst formation with increase in pancreatic inflammation and size of pancreatic pseudocyst on 09/04/13 CT compared with 08/29/13 per radiology. U/S negative for cholecystitis but did show an impacted stone in the gallbladder neck that I doubt is related to his pancreatitis. CBD 8.4 mm. Likely will need EUS after resolution of current pancreatitis episode. Continue bowel rest except for ice chips and sips of water. Supportive care. Dr. Amedeo Plenty to see this weekend.      [place] C. 09/06/2013, 11:05 AM

## 2013-09-06 NOTE — Progress Notes (Signed)
PROGRESS NOTE   Jeffrey Castillo NWG:[id number] DOB: 26-May-1951 DOA: 09/04/2013 PCP: Rozanna Box, MD  Brief narrative: Jeffrey Castillo is an 63 y.o. male with a PMH of chronic pancreatitis with pancreatic pseudocyst, history of alcohol abuse, seizure disorder, hypertension, active tobacco abuse, recent hospitalization 08/01/13-08/07/13 for treatment of suspected perforated duodenal ulcer with intra-abdominal abscess treated with percutaneous drainage and a seven-day course of ertapenem followed by a seven-day course of Cipro/Flagyl (total treatment course with antibiotics 14 days), who was admitted 09/04/13 with acute pancreatitis. Upon initial evaluation in the ED, the patient's lipase levels were found to be greater than 3000. CT scan of the abdomen and pelvis showed a mild increase in size of his pseudocyst with other irregular collections consistent with additional pseudocysts in the peritoneal cavity and retroperitoneum.  Assessment/Plan: Principal Problem:   Acute pancreatitis / Pancreatic pseudocyst The patient was admitted and placed on bowel rest. Continue anti-emetics and pain management as well as supportive care with IV fluids. Lipase 3000--->1816.  GI following and feel that the patient will likely need endoscopic ultrasound after resolution of current pancreatitis. Active Problems:   C O P D Stable at this time.   SEIZURE DISORDER, HX OF Continue Lamictal and Keppra.    Gastritis Continue Protonix.   Tobacco abuse Counseled. Can provide with nicotine patch if needed.    DVT Prophylaxis  Code Status: Full. Family Communication: Wife updated at bedside. Disposition Plan: Home when stable.   IV access:  Peripheral IV  Medical Consultants:  None.  Other Consultants:  Gastroenterology  Anti-infectives:  None.  HPI/Subjective: Jeffrey Castillo continues to have abdominal pain and some nausea. His abdominal pain is exacerbated by moving around. Wants to eat  but understands that this will flare up his pancreatitis further.  Objective: Filed Vitals:   09/05/13 1355 09/05/13 2049 09/06/13 0458 09/06/13 1518  BP: 143/74 152/83 159/79 153/75  Pulse: 79 76 70 68  Temp: 97.5 F (36.4 C) 98.3 F (36.8 C) 98.1 F (36.7 C) 98.4 F (36.9 C)  TempSrc: Axillary Oral Oral Oral  Resp: 16 16 16 16   Height:      Weight:      SpO2: 96% 97% 98% 97%    Intake/Output Summary (Last 24 hours) at 09/06/13 1521 Last data filed at 09/06/13 1500  Gross per 24 hour  Intake   2600 ml  Output   4250 ml  Net  -1650 ml    Exam: Gen:  NAD Cardiovascular:  RRR, No M/R/G Respiratory:  Lungs CTAB Gastrointestinal:  Abdomen soft, diffusely tender, + BS Extremities:  No C/E/C  Data Reviewed: Basic Metabolic Panel:  Recent Labs Lab 09/04/13 1455 09/05/13 0335 09/06/13 0315  NA 140 139 137  K 4.4 3.9 3.9  CL 103 104 101  CO2 23 23 20   GLUCOSE 108* 79 57*  BUN 11 8 6   CREATININE 0.72 0.66 0.58  CALCIUM 10.5 9.0 9.1   GFR Estimated Creatinine Clearance: 87.2 ml/min (by C-G formula based on Cr of 0.58). Liver Function Tests:  Recent Labs Lab 09/04/13 1455 09/06/13 0315  AST 15 17  ALT 6 6  ALKPHOS 73 57  BILITOT 0.4 0.5  PROT 7.1 5.7*  ALBUMIN 3.5 2.7*    Recent Labs Lab 09/04/13 1455 09/05/13 0335  LIPASE >3000* 1816*   CBC:  Recent Labs Lab 09/04/13 1455 09/06/13 0315  WBC 10.0 8.5  NEUTROABS 7.6  --   HGB 14.2 12.5*  HCT 42.6 36.4*  MCV 96.4 94.5  PLT 240 194   Microbiology No results found for this or any previous visit (from the past 240 hour(s)).   Procedures and Diagnostic Studies: Ct Abdomen Pelvis W Contrast  09/04/2013   CLINICAL DATA:  Mid abdominal pain with nausea.  EXAM: CT ABDOMEN AND PELVIS WITH CONTRAST  TECHNIQUE: Multidetector CT imaging of the abdomen and pelvis was performed using the standard protocol following bolus administration of intravenous contrast.  CONTRAST:  50mL OMNIPAQUE IOHEXOL 300  MG/ML SOLN, OMNIPAQUE IOHEXOL 300 MG/ML SOLN  COMPARISON:  08/29/2013  FINDINGS: Since the prior study, inflammatory changes surround the pancreas have increased. A lobulated cyst within the constant process and pancreatic head is mildly larger, currently measuring 2.6 cm x 2.4 cm where it had measured 2.3 cm x 2.3 cm. There are multiple irregular collections that are noted within the mesentery, adjacent to the greater curvature of the stomach, along the central small bowel mesentery, more inferiorly within the small bowel mesentery, and in the retroperitoneum posterior inferior to the right kidney and below the left kidney adjacent to the left psoas muscle. These are essentially stable. No new collection/pseudocyst. No generalized ascites.  Atelectasis at the left lung base has increased from the prior study there is milder stable atelectasis at the posterior right lung base.  Small low-density liver lesions at the dome of the liver are stable, likely cysts. Liver is otherwise unremarkable. Normal spleen.  Gallstones and sludge are evident, also stable. No gallbladder wall thickening or evidence acute cholecystitis. There is no bile duct dilation.  Mild adrenal gland nodularity most likely mild nodular hyperplasia. This is stable.  Stable renal cyst. Kidneys are otherwise unremarkable. No hydronephrosis.  There are multiple colonic diverticula mostly along the sigmoid colon. No diverticulitis or colonic inflammation. Small bowel is mildly prominent with air-fluid levels consistent with a mild adynamic ileus. Normal appendix.  There are dense aortoiliac calcifications. There are significant degenerative changes throughout the visualized spine. No osteoblastic or osteolytic lesions.  IMPRESSION: 1. Inflammatory changes surrounding the pancreas have increased from prior study consistent with recurrent, mild, acute pancreatitis complicating changes from subacute to chronic pancreatitis. There is a mild associated  small bowel adynamic ileus reflected by small bowel prominence in air-fluid levels. 2. A presumed pseudocyst in the pancreatic head and uncinate process has mildly increased in size. Other irregular collections consistent with additional pseudocysts in the peritoneal cavity and both retroperitoneum are essentially stable. 3. Increased atelectasis in the left lung base since the prior study. 4. No other change from the prior study. Chronic findings include gallstones and sludge, low-density liver lesions that are likely cysts, renal cysts, colonic diverticula without diverticulitis and significant degenerative changes of the visualized spine.   Electronically Signed   By: Amie Portland M.D.   On: 09/04/2013 18:52   Scheduled Meds: . cloNIDine  0.2 mg Transdermal Weekly  . enoxaparin (LOVENOX) injection  40 mg Subcutaneous Q24H  . lamoTRIgine  50 mg Oral BID  . levETIRAcetam  500 mg Intravenous Q12H  . nicotine  21 mg Transdermal Daily  . pantoprazole (PROTONIX) IV  40 mg Intravenous Q12H   Continuous Infusions: . dextrose 5 % and 0.9% NaCl 100 mL/hr at 09/06/13 0551    Time spent: 25 minutes.    LOS: 2 days   Brooksie Ellwanger  [facility] Pager [phone]. If unable to reach me by pager, please call my cell phone at [phone].  *Please note that the hospitalists switch teams on Wednesdays.  Please call the flow manager at [phone] if you are having difficulty reaching the hospitalist taking care of this patient as she can update you and provide the most up-to-date pager number of provider caring for the patient. If 8PM-8AM, please contact night-coverage at [email or web address], password [email or web address]  09/06/2013, 3:21 PM    **Disclaimer: This note was dictated with voice recognition software. Similar sounding words can inadvertently be transcribed and this note may contain transcription errors which may not have been corrected upon publication of note.**

## 2013-09-06 NOTE — Progress Notes (Signed)
Hypoglycemic Event  CBG: 55  Treatment: D50 IV 25 mL  Symptoms: Hungry  Follow-up CBG: Time:0525 CBG Result: 97  Possible Reasons for Event: Other: Pt NPO  Comments/MD notified: Glucose level with morning labs 57, stat CBG was taken. NP on call notified, received orders for new IV fluids with dextrose added and q 8 hr CBG checks. This RN to continue to monitor. Holli Humbles Jessilynn Taft RN     Noreene Larsson  Remember to initiate Hypoglycemia Order Set & complete

## 2013-09-07 ENCOUNTER — Encounter (HOSPITAL_COMMUNITY): Payer: Self-pay | Admitting: Internal Medicine

## 2013-09-07 DIAGNOSIS — D132 Benign neoplasm of duodenum: Secondary | ICD-10-CM

## 2013-09-07 DIAGNOSIS — D1330 Benign neoplasm of unspecified part of small intestine: Secondary | ICD-10-CM

## 2013-09-07 DIAGNOSIS — E876 Hypokalemia: Secondary | ICD-10-CM | POA: Diagnosis present

## 2013-09-07 HISTORY — DX: Benign neoplasm of duodenum: D13.2

## 2013-09-07 LAB — BASIC METABOLIC PANEL
BUN: 3 mg/dL — ABNORMAL LOW (ref 6–23)
CO2: 20 mEq/L (ref 19–32)
Calcium: 9 mg/dL (ref 8.4–10.5)
Chloride: 100 mEq/L (ref 96–112)
Creatinine, Ser: 0.61 mg/dL (ref 0.50–1.35)
GFR calc Af Amer: >90 mL/min (ref >90)
GFR calc non Af Amer: >90 mL/min (ref >90)
Glucose, Bld: 76 mg/dL (ref 70–99)
Potassium: 3.4 mEq/L — ABNORMAL LOW (ref 3.7–5.3)
Sodium: 137 mEq/L (ref 137–147)

## 2013-09-07 LAB — LIPASE, BLOOD: Lipase: 530 U/L — ABNORMAL HIGH (ref 11–59)

## 2013-09-07 LAB — GLUCOSE, CAPILLARY
Glucose-Capillary: 93 mg/dL (ref 70–99)
Glucose-Capillary: 139 mg/dL — ABNORMAL HIGH (ref 70–99)

## 2013-09-07 MED ORDER — KCL IN DEXTROSE-NACL 20-5-0.9 MEQ/L-%-% IV SOLN
INTRAVENOUS | Status: DC
  Administered 2013-09-07: 11:00:00 via INTRAVENOUS
  Administered 2013-09-08: 100 mL/h via INTRAVENOUS
  Filled 2013-09-07 (×7): qty 1000

## 2013-09-07 NOTE — Progress Notes (Signed)
[facility] Progress Note  Subjective: Abdominal pain responding to morphine using and about twice a day, overall pain better tolerating ice chips  Objective: Vital signs in last 24 hours: Temp:  [98.3 F (36.8 C)-98.8 F (37.1 C)] 98.3 F (36.8 C) (02/28 0552) Pulse Rate:  [68-74] 74 (02/28 0552) Resp:  [16] 16 (02/28 0552) BP: (124-158)/(66-79) 124/66 mmHg (02/28 0552) SpO2:  [94 %-97 %] 94 % (02/28 0552) Weight change:    PE: Abdomen soft diffuse mild tenderness  Lab Results: Results for orders placed during the hospital encounter of 09/04/13 (from the past 24 hour(s))  GLUCOSE, CAPILLARY     Status: None   Collection Time    09/06/13  3:34 PM      Result Value Ref Range   Glucose-Capillary 85  70 - 99 mg/dL  GLUCOSE, CAPILLARY     Status: None   Collection Time    09/06/13  9:57 PM      Result Value Ref Range   Glucose-Capillary 83  70 - 99 mg/dL   Comment 1 Notify RN    LIPASE, BLOOD     Status: Abnormal   Collection Time    09/07/13  4:30 AM      Result Value Ref Range   Lipase 530 (*) 11 - 59 U/L  BASIC METABOLIC PANEL     Status: Abnormal   Collection Time    09/07/13  4:30 AM      Result Value Ref Range   Sodium 137  137 - 147 mEq/L   Potassium 3.4 (*) 3.7 - 5.3 mEq/L   Chloride 100  96 - 112 mEq/L   CO2 20  19 - 32 mEq/L   Glucose, Bld 76  70 - 99 mg/dL   BUN 3 (*) 6 - 23 mg/dL   Creatinine, Ser 0.61  0.50 - 1.35 mg/dL   Calcium 9.0  8.4 - 10.5 mg/dL   GFR calc non Af Amer >90  >90 mL/min   GFR calc Af Amer >90  >90 mL/min  GLUCOSE, CAPILLARY     Status: None   Collection Time    09/07/13  6:27 AM      Result Value Ref Range   Glucose-Capillary 93  70 - 99 mg/dL   Comment 1 Notify RN      Studies/Results: US Abdomen Complete  09/05/2013   CLINICAL DATA:  Epigastric pain.  EXAM: ULTRASOUND ABDOMEN COMPLETE  COMPARISON:  CT ABD/PELVIS W CM dated 09/04/2013  FINDINGS: Gallbladder:  There is a calcified non mobile stone near the  gallbladder neck. It measures 1.1 cm in greatest dimension. There is no gallbladder wall thickening nor pericholecystic fluid nor positive sonographic Murphy's sign.  Common bile duct:  Diameter: 8.4 mm evaluation of the common bile duct was limited due to bowel gas and surrounding structures.  Liver:  The echotexture of the liver is mildly increased. No discrete cystic or solid masses are demonstrated. No intrahepatic ductal dilation is demonstrated.  IVC:  No abnormality visualized.  Pancreas:  Bowel gas limits evaluation of the pancreas. The pancreatic duct is mildly dilated at 5.4 mm.  Spleen:  Size and appearance within normal limits.  Right Kidney:  Length: 11.1 cm. Echogenicity within normal limits. No hydronephrosis visualized. There is a midpole cyst 2.7 in greatest dimension  Left Kidney:  Length: 11.1. Echogenicity within normal limits. No hydronephrosis visualized. There is a midpole cyst measuring 1.2 cm in greatest dimension.  Abdominal aorta:  No aneurysm visualized.  Other findings:  No ascites is demonstrated.  IMPRESSION: 1. Evaluation the pancreas is limited by bowel gas. There is pancreatic ductal dilation. Presumed pseudocyst demonstrated on the CT scan of 25 February are not demonstrated on this study. 2. The gallbladder contains a stone which may be impacted in the gallbladder neck but there is no sonographic evidence of acute cholecystitis. 3. The common bile duct is dilated to 8.4 mm where visualized. 4. The kidneys exhibit no evidence of obstruction. There are simple appearing midpole cyst bilaterally.   Electronically Signed   By: David  Martin[place]   On: 09/05/2013 19:37      Assessment: 1. Pancreatitis and pseudocyst, recurrent, lipase improved Duodenal adenoma 1.   Plan: 1. Continue supportive care for now 2. We'll begin clear liquid diet today 3. At some point duodenal adenoma will need to be reimaged, possibly with EUS directed biopsies. This lesion may not be easily  resectable endoscopically    Ltanya Bayley C 09/07/2013, 8:32 AM

## 2013-09-07 NOTE — Progress Notes (Signed)
PROGRESS NOTE   OSLO MULHEARN [id number] DOB: [birth year] DOA: 09/04/2013 PCP: [name], MD  Brief narrative: Jeffrey Castillo is an 63 y.o. male with a PMH of chronic pancreatitis with pancreatic pseudocyst, history of alcohol abuse, seizure disorder, hypertension, active tobacco abuse, recent hospitalization 08/01/13-08/07/13 for treatment of suspected perforated duodenal ulcer with intra-abdominal abscess treated with percutaneous drainage and a seven-day course of ertapenem followed by a seven-day course of Cipro/Flagyl (total treatment course with antibiotics 14 days), who was admitted 09/04/13 with acute pancreatitis. Upon initial evaluation in the ED, the patient's lipase levels were found to be greater than 3000. CT scan of the abdomen and pelvis showed a mild increase in size of his pseudocyst with other irregular collections consistent with additional pseudocysts in the peritoneal cavity and retroperitoneum.  Assessment/Plan: Principal Problem:   Acute pancreatitis / Pancreatic pseudocyst The patient was admitted and placed on bowel rest. Continue anti-emetics and pain management as well as supportive care with IV fluids. Lipase 3000--->1816--->530.  GI following and feel that the patient will likely need endoscopic ultrasound after resolution of current pancreatitis. Diet advanced to clear liquids. Pain improved. Active Problems:   Duodenal adenoma Outpatient endoscopic ultrasound with directed biopsies recommended by gastroenterology.   Hypokalemia Add potassium to IV fluids.   C O P D Stable at this time.   SEIZURE DISORDER, HX OF Continue Lamictal and Keppra.    Gastritis Continue Protonix.   Tobacco abuse Counseled. Can provide with nicotine patch if needed.   DVT Prophylaxis Continue Lovenox.  Code Status: Full. Family Communication: Wife updated at bedside 09/06/13. Disposition Plan: Home when stable.   IV access:  Peripheral IV  Medical  Consultants:  None.  Other Consultants:  Gastroenterology  Anti-infectives:  None.  HPI/Subjective: Donetta Potts says he is feeling much better, less pain, using less pain medication. Tolerating ice chips. Had a loose bowel movement this morning.  Objective: Filed Vitals:   09/06/13 0458 09/06/13 1518 09/06/13 2118 09/07/13 0552  BP: 159/79 153/75 158/79 124/66  Pulse: 70 68 72 74  Temp: 98.1 F (36.7 C) 98.4 F (36.9 C) 98.8 F (37.1 C) 98.3 F (36.8 C)  TempSrc: Oral Oral Oral Oral  Resp: 16 16 16 16   Height:      Weight:      SpO2: 98% 97% 97% 94%    Intake/Output Summary (Last 24 hours) at 09/07/13 0842 Last data filed at 09/07/13 0600  Gross per 24 hour  Intake   2925 ml  Output   2350 ml  Net    575 ml    Exam: Gen:  NAD Cardiovascular:  RRR, No M/R/G Respiratory:  Lungs CTAB Gastrointestinal:  Abdomen soft, mildly tender, + BS Extremities:  No C/E/C  Data Reviewed: Basic Metabolic Panel:  Recent Labs Lab 09/04/13 1455 09/05/13 0335 09/06/13 0315 09/07/13 0430  NA 140 139 137 137  K 4.4 3.9 3.9 3.4*  CL 103 104 101 100  CO2 23 23 20 20   GLUCOSE 108* 79 57* 76  BUN 11 8 6  3*  CREATININE 0.72 0.66 0.58 0.61  CALCIUM 10.5 9.0 9.1 9.0   GFR Estimated Creatinine Clearance: 87.2 ml/min (by C-G formula based on Cr of 0.61). Liver Function Tests:  Recent Labs Lab 09/04/13 1455 09/06/13 0315  AST 15 17  ALT 6 6  ALKPHOS 73 57  BILITOT 0.4 0.5  PROT 7.1 5.7*  ALBUMIN 3.5 2.7*    Recent Labs Lab 09/04/13 1455  09/05/13 0335 09/07/13 0430  LIPASE >3000* 1816* 530*   CBC:  Recent Labs Lab 09/04/13 1455 09/06/13 0315  WBC 10.0 8.5  NEUTROABS 7.6  --   HGB 14.2 12.5*  HCT 42.6 36.4*  MCV 96.4 94.5  PLT 240 194   Microbiology No results found for this or any previous visit (from the past 240 hour(s)).   Procedures and Diagnostic Studies: Ct Abdomen Pelvis W Contrast  09/04/2013   CLINICAL DATA:  Mid abdominal pain  with nausea.  EXAM: CT ABDOMEN AND PELVIS WITH CONTRAST  TECHNIQUE: Multidetector CT imaging of the abdomen and pelvis was performed using the standard protocol following bolus administration of intravenous contrast.  CONTRAST:  53mL OMNIPAQUE IOHEXOL 300 MG/ML SOLN, 165mL OMNIPAQUE IOHEXOL 300 MG/ML SOLN  COMPARISON:  08/29/2013  FINDINGS: Since the prior study, inflammatory changes surround the pancreas have increased. A lobulated cyst within the constant process and pancreatic head is mildly larger, currently measuring 2.6 cm x 2.4 cm where it had measured 2.3 cm x 2.3 cm. There are multiple irregular collections that are noted within the mesentery, adjacent to the greater curvature of the stomach, along the central small bowel mesentery, more inferiorly within the small bowel mesentery, and in the retroperitoneum posterior inferior to the right kidney and below the left kidney adjacent to the left psoas muscle. These are essentially stable. No new collection/pseudocyst. No generalized ascites.  Atelectasis at the left lung base has increased from the prior study there is milder stable atelectasis at the posterior right lung base.  Small low-density liver lesions at the dome of the liver are stable, likely cysts. Liver is otherwise unremarkable. Normal spleen.  Gallstones and sludge are evident, also stable. No gallbladder wall thickening or evidence acute cholecystitis. There is no bile duct dilation.  Mild adrenal gland nodularity most likely mild nodular hyperplasia. This is stable.  Stable renal cyst. Kidneys are otherwise unremarkable. No hydronephrosis.  There are multiple colonic diverticula mostly along the sigmoid colon. No diverticulitis or colonic inflammation. Small bowel is mildly prominent with air-fluid levels consistent with a mild adynamic ileus. Normal appendix.  There are dense aortoiliac calcifications. There are significant degenerative changes throughout the visualized spine. No osteoblastic  or osteolytic lesions.  IMPRESSION: 1. Inflammatory changes surrounding the pancreas have increased from prior study consistent with recurrent, mild, acute pancreatitis complicating changes from subacute to chronic pancreatitis. There is a mild associated small bowel adynamic ileus reflected by small bowel prominence in air-fluid levels. 2. A presumed pseudocyst in the pancreatic head and uncinate process has mildly increased in size. Other irregular collections consistent with additional pseudocysts in the peritoneal cavity and both retroperitoneum are essentially stable. 3. Increased atelectasis in the left lung base since the prior study. 4. No other change from the prior study. Chronic findings include gallstones and sludge, low-density liver lesions that are likely cysts, renal cysts, colonic diverticula without diverticulitis and significant degenerative changes of the visualized spine.   Electronically Signed   By: Lajean Manes M.D.   On: 09/04/2013 18:52   Scheduled Meds: . cloNIDine  0.2 mg Transdermal Weekly  . enoxaparin (LOVENOX) injection  40 mg Subcutaneous Q24H  . lamoTRIgine  50 mg Oral BID  . levETIRAcetam  500 mg Intravenous Q12H  . nicotine  21 mg Transdermal Daily  . pantoprazole (PROTONIX) IV  40 mg Intravenous Q12H   Continuous Infusions: . dextrose 5 % and 0.9% NaCl 100 mL/hr at 09/06/13 1620    Time spent: 25  minutes.    LOS: 3 days   [facility] Pager [phone]. If unable to reach me by pager, please call my cell phone at [phone].  *Please note that the hospitalists switch teams on Wednesdays. Please call the flow manager at [phone] if you are having difficulty reaching the hospitalist taking care of this patient as she can update you and provide the most up-to-date pager number of provider caring for the patient. If 8PM-8AM, please contact night-coverage at [email or web address], password [facility]  09/07/2013, 8:42 AM    **Disclaimer: This note was  dictated with voice recognition software. Similar sounding words can inadvertently be transcribed and this note may contain transcription errors which may not have been corrected upon publication of note.**     In an effort to keep you and your family informed about your hospital stay, I am providing you with this information sheet. If you or your family have any questions, please do not hesitate to have the nursing staff page me to set up a meeting time.  RAYNOR CALCATERRA 09/07/2013 3 (Number of days in the hospital)  Treatment team:  Dr. Jacquelynn Cree, Hospitalist (Internist)  Dr. Teena Irani (gastroenterologist)  Active Treatment Issues with Plan: Principal Problem:   Acute pancreatitis / Pancreatic pseudocyst Continue IV fluids, pain medicines as needed. Lipase 3000--->1816--->530.  Advance diet to clear liquids today. Active Problems:   Duodenal polyp Will likely need outpatient endoscopic ultrasound with biopsy to further evaluate.  Low potassium We have added potassium to your IV fluids.   C O P D Stable at this time.   SEIZURE DISORDER Continue Lamictal and Keppra.    Smoker We encourage you to quit and can provide you with a nicotine patch if you experience withdrawal symptoms.   Blood clot prevention Continue Lovenox.  Anticipated discharge date: Depends on how quickly we can advanced your diet.

## 2013-09-08 LAB — GLUCOSE, CAPILLARY
Glucose-Capillary: 100 mg/dL — ABNORMAL HIGH (ref 70–99)
Glucose-Capillary: 103 mg/dL — ABNORMAL HIGH (ref 70–99)
Glucose-Capillary: 107 mg/dL — ABNORMAL HIGH (ref 70–99)
Glucose-Capillary: 111 mg/dL — ABNORMAL HIGH (ref 70–99)

## 2013-09-08 LAB — LIPASE, BLOOD: Lipase: 518 U/L — ABNORMAL HIGH (ref 11–59)

## 2013-09-08 NOTE — Progress Notes (Signed)
PROGRESS NOTE   Jeffrey Castillo [id number] DOB: [birth year] DOA: 09/04/2013 PCP: [name], MD  Brief narrative: Jeffrey Castillo is an 63 y.o. male with a PMH of chronic pancreatitis with pancreatic pseudocyst, history of alcohol abuse, seizure disorder, hypertension, active tobacco abuse, recent hospitalization 08/01/13-08/07/13 for treatment of suspected perforated duodenal ulcer with intra-abdominal abscess treated with percutaneous drainage and a seven-day course of ertapenem followed by a seven-day course of Cipro/Flagyl (total treatment course with antibiotics 14 days), who was admitted 09/04/13 with acute pancreatitis. Upon initial evaluation in the ED, the patient's lipase levels were found to be greater than 3000. CT scan of the abdomen and pelvis showed a mild increase in size of his pseudocyst with other irregular collections consistent with additional pseudocysts in the peritoneal cavity and retroperitoneum.  Assessment/Plan: Principal Problem:   Acute pancreatitis / Pancreatic pseudocyst The patient was admitted and placed on bowel rest. Continue anti-emetics and pain management as well as supportive care with IV fluids. Lipase 3000--->1816--->530--->518.  GI following and feel that the patient will likely need endoscopic ultrasound after resolution of current pancreatitis. Diet advanced to clear liquids 09/07/13 and full liquids 09/08/13. Patient told to go back to clear liquids if he has any recurrence of abdominal pain. Pain improved. Has not required IV pain medications and 24 hours. Active Problems:   Duodenal adenoma Outpatient endoscopic ultrasound with directed biopsies recommended by gastroenterology.   Hypokalemia Potassium added to IV fluids.   C O P D Stable at this time.   SEIZURE DISORDER, HX OF Continue Lamictal and Keppra.    Gastritis Continue Protonix.   Tobacco abuse Counseled. Can provide with nicotine patch if needed.   DVT Prophylaxis Continue  Lovenox.  Code Status: Full. Family Communication: Wife updated at bedside 09/06/13. Disposition Plan: Home when stable.   IV access:  Peripheral IV  Medical Consultants:  None.  Other Consultants:  Gastroenterology  Anti-infectives:  None.  HPI/Subjective: Jeffrey Castillo continues to feel good, very little abdominal pain, no nausea or vomiting. Has not used IV pain medications and 24 hours. Had Tylenol for headache.  Objective: Filed Vitals:   09/07/13 0552 09/07/13 1419 09/07/13 2108 09/08/13 0519  BP: 124/66 166/78 155/72 143/87  Pulse: 74 66 71 68  Temp: 98.3 F (36.8 C) 98.6 F (37 C)  97.8 F (36.6 C)  TempSrc: Oral Oral  Oral  Resp: 16 16 16 16   Height:      Weight:      SpO2: 94% 97% 98% 95%    Intake/Output Summary (Last 24 hours) at 09/08/13 0803 Last data filed at 09/08/13 0519  Gross per 24 hour  Intake    340 ml  Output   2300 ml  Net  -1960 ml    Exam: Gen:  NAD Cardiovascular:  RRR, No M/R/G Respiratory:  Lungs CTAB Gastrointestinal:  Abdomen soft, mildly tender, + BS Extremities:  No C/E/C  Data Reviewed: Basic Metabolic Panel:  Recent Labs Lab 09/04/13 1455 09/05/13 0335 09/06/13 0315 09/07/13 0430  NA 140 139 137 137  K 4.4 3.9 3.9 3.4*  CL 103 104 101 100  CO2 23 23 20 20   GLUCOSE 108* 79 57* 76  BUN 11 8 6  3*  CREATININE 0.72 0.66 0.58 0.61  CALCIUM 10.5 9.0 9.1 9.0   GFR Estimated Creatinine Clearance: 87.2 ml/min (by C-G formula based on Cr of 0.61). Liver Function Tests:  Recent Labs Lab 09/04/13 1455 09/06/13 0315  AST 15  17  ALT 6 6  ALKPHOS 73 57  BILITOT 0.4 0.5  PROT 7.1 5.7*  ALBUMIN 3.5 2.7*    Recent Labs Lab 09/04/13 1455 09/05/13 0335 09/07/13 0430 09/08/13 0407  LIPASE >3000* 1816* 530* 518*   CBC:  Recent Labs Lab 09/04/13 1455 09/06/13 0315  WBC 10.0 8.5  NEUTROABS 7.6  --   HGB 14.2 12.5*  HCT 42.6 36.4*  MCV 96.4 94.5  PLT 240 194   Microbiology No results found for  this or any previous visit (from the past 240 hour(s)).   Procedures and Diagnostic Studies: Ct Abdomen Pelvis W Contrast  09/04/2013   CLINICAL DATA:  Mid abdominal pain with nausea.  EXAM: CT ABDOMEN AND PELVIS WITH CONTRAST  TECHNIQUE: Multidetector CT imaging of the abdomen and pelvis was performed using the standard protocol following bolus administration of intravenous contrast.  CONTRAST:  65mL OMNIPAQUE IOHEXOL 300 MG/ML SOLN, 162mL OMNIPAQUE IOHEXOL 300 MG/ML SOLN  COMPARISON:  08/29/2013  FINDINGS: Since the prior study, inflammatory changes surround the pancreas have increased. A lobulated cyst within the constant process and pancreatic head is mildly larger, currently measuring 2.6 cm x 2.4 cm where it had measured 2.3 cm x 2.3 cm. There are multiple irregular collections that are noted within the mesentery, adjacent to the greater curvature of the stomach, along the central small bowel mesentery, more inferiorly within the small bowel mesentery, and in the retroperitoneum posterior inferior to the right kidney and below the left kidney adjacent to the left psoas muscle. These are essentially stable. No new collection/pseudocyst. No generalized ascites.  Atelectasis at the left lung base has increased from the prior study there is milder stable atelectasis at the posterior right lung base.  Small low-density liver lesions at the dome of the liver are stable, likely cysts. Liver is otherwise unremarkable. Normal spleen.  Gallstones and sludge are evident, also stable. No gallbladder wall thickening or evidence acute cholecystitis. There is no bile duct dilation.  Mild adrenal gland nodularity most likely mild nodular hyperplasia. This is stable.  Stable renal cyst. Kidneys are otherwise unremarkable. No hydronephrosis.  There are multiple colonic diverticula mostly along the sigmoid colon. No diverticulitis or colonic inflammation. Small bowel is mildly prominent with air-fluid levels consistent with  a mild adynamic ileus. Normal appendix.  There are dense aortoiliac calcifications. There are significant degenerative changes throughout the visualized spine. No osteoblastic or osteolytic lesions.  IMPRESSION: 1. Inflammatory changes surrounding the pancreas have increased from prior study consistent with recurrent, mild, acute pancreatitis complicating changes from subacute to chronic pancreatitis. There is a mild associated small bowel adynamic ileus reflected by small bowel prominence in air-fluid levels. 2. A presumed pseudocyst in the pancreatic head and uncinate process has mildly increased in size. Other irregular collections consistent with additional pseudocysts in the peritoneal cavity and both retroperitoneum are essentially stable. 3. Increased atelectasis in the left lung base since the prior study. 4. No other change from the prior study. Chronic findings include gallstones and sludge, low-density liver lesions that are likely cysts, renal cysts, colonic diverticula without diverticulitis and significant degenerative changes of the visualized spine.   Electronically Signed   By: Lajean Manes M.D.   On: 09/04/2013 18:52   Scheduled Meds: . cloNIDine  0.2 mg Transdermal Weekly  . enoxaparin (LOVENOX) injection  40 mg Subcutaneous Q24H  . lamoTRIgine  50 mg Oral BID  . levETIRAcetam  500 mg Intravenous Q12H  . nicotine  21 mg Transdermal Daily  .  pantoprazole (PROTONIX) IV  40 mg Intravenous Q12H   Continuous Infusions: . dextrose 5 % and 0.9 % NaCl with KCl 20 mEq/L 100 mL/hr at 09/07/13 1035    Time spent: 25 minutes.    LOS: 4 days   [facility] Pager [phone]. If unable to reach me by pager, please call my cell phone at [phone].  *Please note that the hospitalists switch teams on Wednesdays. Please call the flow manager at [phone] if you are having difficulty reaching the hospitalist taking care of this patient as she can update you and provide the  most up-to-date pager number of provider caring for the patient. If 8PM-8AM, please contact night-coverage at [email or web address], password [facility]  09/08/2013, 8:03 AM    **Disclaimer: This note was dictated with voice recognition software. Similar sounding words can inadvertently be transcribed and this note may contain transcription errors which may not have been corrected upon publication of note.**     In an effort to keep you and your family informed about your hospital stay, I am providing you with this information sheet. If you or your family have any questions, please do not hesitate to have the nursing staff page me to set up a meeting time.  Jeffrey Castillo 09/08/2013 4 (Number of days in the hospital)  Treatment team:  Dr. Jacquelynn Cree, Hospitalist (Internist)  Dr. Teena Irani (gastroenterologist)  Active Treatment Issues with Plan: Principal Problem:   Acute pancreatitis / Pancreatic pseudocyst Continue IV fluids, pain medicines as needed. Lipase 3000--->1816--->530.  Advance diet to clear liquids today. Active Problems:   Duodenal polyp Will likely need outpatient endoscopic ultrasound with biopsy to further evaluate.  Low potassium We have added potassium to your IV fluids.   C O P D Stable at this time.   SEIZURE DISORDER Continue Lamictal and Keppra.    Smoker We encourage you to quit and can provide you with a nicotine patch if you experience withdrawal symptoms.   Blood clot prevention Continue Lovenox.  Anticipated discharge date: Depends on how quickly we can advanced your diet.

## 2013-09-08 NOTE — Progress Notes (Addendum)
[facility] Progress Note  Subjective: Pain level down to a 1, states he is not using any morphine Objective: Vital signs in last 24 hours: Temp:  [97.8 F (36.6 C)-98.6 F (37 C)] 97.8 F (36.6 C) (03/01 0519) Pulse Rate:  [66-71] 68 (03/01 0519) Resp:  [16] 16 (03/01 0519) BP: (143-166)/(72-87) 143/87 mmHg (03/01 0519) SpO2:  [95 %-98 %] 95 % (03/01 0519) Weight change:    PE: Abdomen soft minimally tender  Lab Results: Results for orders placed during the hospital encounter of 09/04/13 (from the past 24 hour(s))  GLUCOSE, CAPILLARY     Status: Abnormal   Collection Time    09/07/13  3:49 PM      Result Value Ref Range   Glucose-Capillary 139 (*) 70 - 99 mg/dL  GLUCOSE, CAPILLARY     Status: Abnormal   Collection Time    09/08/13 12:19 AM      Result Value Ref Range   Glucose-Capillary 111 (*) 70 - 99 mg/dL   Comment 1 Notify RN    LIPASE, BLOOD     Status: Abnormal   Collection Time    09/08/13  4:07 AM      Result Value Ref Range   Lipase 518 (*) 11 - 59 U/L  GLUCOSE, CAPILLARY     Status: Abnormal   Collection Time    09/08/13  7:44 AM      Result Value Ref Range   Glucose-Capillary 103 (*) 70 - 99 mg/dL    Studies/Results: No results found.    Assessment: 1. 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 Duodenal adenoma by recent EGD  Plan: Continue supportive care and will hold at clear liquid diet another day possibly advanced to full liquids tomorrow  Repeat CT scan at some point to followup fluid collections Eventual repeat EGD versus EUS to better assess fairly advanced appearing duodenal adenoma seen on previous EGD    Jendayi Berling C 09/08/2013, 8:43 AM

## 2013-09-09 LAB — BASIC METABOLIC PANEL
BUN: <3 mg/dL — ABNORMAL LOW (ref 6–23)
CO2: 21 mEq/L (ref 19–32)
Calcium: 8.7 mg/dL (ref 8.4–10.5)
Chloride: 110 mEq/L (ref 96–112)
Creatinine, Ser: 0.6 mg/dL (ref 0.50–1.35)
GFR calc Af Amer: >90 mL/min (ref >90)
GFR calc non Af Amer: >90 mL/min (ref >90)
Glucose, Bld: 111 mg/dL — ABNORMAL HIGH (ref 70–99)
Potassium: 3.5 mEq/L — ABNORMAL LOW (ref 3.7–5.3)
Sodium: 143 mEq/L (ref 137–147)

## 2013-09-09 LAB — GLUCOSE, CAPILLARY: Glucose-Capillary: 117 mg/dL — ABNORMAL HIGH (ref 70–99)

## 2013-09-09 LAB — LIPASE, BLOOD: Lipase: 50 U/L (ref 11–59)

## 2013-09-09 MED ORDER — PANCRELIPASE (LIP-PROT-AMYL) 12000-38000 UNITS PO CPEP: 3.0000 | ORAL_CAPSULE | Freq: Three times a day (TID) | ORAL | Status: DC

## 2013-09-09 MED ORDER — POTASSIUM CHLORIDE CRYS ER 20 MEQ PO TBCR
40.0000 meq | EXTENDED_RELEASE_TABLET | Freq: Once | ORAL | Status: AC
  Administered 2013-09-09: 40 meq via ORAL
  Filled 2013-09-09: qty 2

## 2013-09-09 MED ORDER — PANCRELIPASE (LIP-PROT-AMYL) 12000-38000 UNITS PO CPEP
3.0000 | ORAL_CAPSULE | Freq: Three times a day (TID) | ORAL | Status: DC
  Administered 2013-09-09: 3 via ORAL
  Filled 2013-09-09 (×3): qty 3

## 2013-09-09 MED ORDER — POTASSIUM CHLORIDE 10 MEQ/100ML IV SOLN
10.0000 meq | INTRAVENOUS | Status: AC
  Administered 2013-09-09 (×2): 10 meq via INTRAVENOUS
  Filled 2013-09-09 (×3): qty 100

## 2013-09-09 NOTE — Progress Notes (Signed)
Jeffrey Castillo 9:51 AM  Subjective: Patient without any GI complaints and he is familiar to me from previous hospital stay and his labs and CT were reviewed and his case was discussed with my partner  Objective: Vital signs stable afebrile no acute distress abdomen is soft nontender lipase back to normal  Assessment: Recurrent pancreatitis with stable pseudocyst  Plan: Okay with me to go home and can slowly advance diet there and would DC him on pump inhibitors and pancreatic enzyme and followup with his primary GI Dr. Amedeo Plenty in a few weeks and probably set up an EUS at that point to reevaluate the cysts and make sure no CBD stones are playing a role and to reevaluate the pancreatic duct as well since unfortunately the patient cannot get in the MRI scanner due to his nerve stimulator Jeffrey Castillo E

## 2013-09-09 NOTE — Discharge Summary (Signed)
Physician Discharge Summary  Jeffrey Castillo [id number] DOB: 1950-07-20 DOA: 09/04/2013  PCP: [name], MD  Admit date: 09/04/2013 Discharge date: 09/09/2013  Recommendations for Outpatient Follow-up:  1. F/U with Dr. Amedeo Plenty in 3 weeks, will need EUS scheduled to evaluate duodenal adenoma.  Discharge Diagnoses:  Principal Problem:    Acute pancreatitis Active Problems:    C O P D    SEIZURE DISORDER, HX OF    Pancreatic pseudocyst    Gastritis    Tobacco abuse    Hypokalemia    Duodenal adenoma  Discharge Condition: Improved.  Diet recommendation: Low fat as tolerated  History of present illness:  [name] is an 63 y.o. male with a PMH of chronic pancreatitis with pancreatic pseudocyst, history of alcohol abuse, seizure disorder, hypertension, active tobacco abuse, recent hospitalization 08/01/13-08/07/13 for treatment of suspected perforated duodenal ulcer with intra-abdominal abscess treated with percutaneous drainage and a seven-day course of ertapenem followed by a seven-day course of Cipro/Flagyl (total treatment course with antibiotics 14 days), who was admitted 09/04/13 with acute pancreatitis. Upon initial evaluation in the ED, the patient's lipase levels were found to be greater than 3000. CT scan of the abdomen and pelvis showed a mild increase in size of his pseudocyst with other irregular collections consistent with additional pseudocysts in the peritoneal cavity and retroperitoneum.  Hospital Course by problem:  Principal Problem:  Acute pancreatitis / Pancreatic pseudocyst  The patient was admitted and placed on bowel rest. Continue anti-emetics and pain management as well as supportive care with IV fluids. Lipase 3000--->1816--->530--->518--->50 . GI following and feel that the patient will likely need endoscopic ultrasound after resolution of current pancreatitis. Diet advanced to clear liquids 09/07/13 and full liquids 09/08/13. Patient told to go  back to clear liquids if he has any recurrence of abdominal pain. Pain improved. Has not required IV pain medications in 48 hours. Discharge home on pancreatic enzyme replacement therapy. Active Problems:  Duodenal adenoma  Outpatient endoscopic ultrasound with directed biopsies recommended by gastroenterology.  Hypokalemia  Potassium repleted prior to discharge.  C O P D  Stable at this time.  SEIZURE DISORDER, HX OF  Continue Lamictal and Keppra.  Gastritis  Continue Protonix.  Tobacco abuse  Counseled.  Procedures:  None.  Consultations:  Dr. Teena Irani, Gastroenterology  Discharge Exam: Filed Vitals:   09/09/13 0538  BP: 143/88  Pulse: 64  Temp: 97.2 F (36.2 C)  Resp: 15   Filed Vitals:   09/08/13 1424 09/08/13 2107 09/08/13 2200 09/09/13 0538  BP: 166/91 165/81 146/82 143/88  Pulse: 102 66  64  Temp: 98.8 F (37.1 C) 98.4 F (36.9 C)  97.2 F (36.2 C)  TempSrc: Oral Oral  Oral  Resp: 18 16  15   Height:      Weight:      SpO2: 100% 100%  99%    Gen:  NAD Cardiovascular:  RRR, No M/R/G Respiratory: Lungs CTAB Gastrointestinal: Abdomen soft, NT/ND with normal active bowel sounds. Extremities: No C/E/C   Discharge Instructions      Discharge Orders   Future Appointments Provider Department Dept Phone   10/21/2013 10:30 AM Dennie Bible, NP [facility] [phone]   Future Orders Complete By Expires   Call MD for:  persistant nausea and vomiting  As directed    Call MD for:  severe uncontrolled pain  As directed    Diet - low sodium heart healthy  As directed  Discharge instructions  As directed    Comments:     You were cared for by Dr. Jacquelynn Cree  (a hospitalist) during your hospital stay. If you have any questions about your discharge medications or the care you received while you were in the hospital after you are discharged, you can call the unit and ask to speak with the hospitalist on call if the hospitalist  that took care of you is not available. Once you are discharged, your primary care physician will handle any further medical issues. Please note that NO REFILLS for any discharge medications will be authorized once you are discharged, as it is imperative that you return to your primary care physician (or establish a relationship with a primary care physician if you do not have one) for your aftercare needs so that they can reassess your need for medications and monitor your lab values.  Any outstanding tests can be reviewed by your PCP at your follow up visit.  It is also important to review any medicine changes with your PCP.  Please bring these d/c instructions with you to your next visit so your physician can review these changes with you.  If you do not have a primary care physician, you can call [phone] for a physician referral.  It is highly recommended that you obtain a PCP for hospital follow up.   Increase activity slowly  As directed        Medication List         bacitracin-polymyxin b ointment  Commonly known as:  POLYSPORIN  Apply 1 application topically daily as needed.     cloNIDine 0.2 mg/24hr patch  Commonly known as:  CATAPRES - Dosed in mg/24 hr  Place 1 patch onto the skin once a week.     lamoTRIgine 25 MG tablet  Commonly known as:  LAMICTAL  Take 50 mg by mouth 2 (two) times daily.     levETIRAcetam 500 MG tablet  Commonly known as:  KEPPRA  Take 500 mg by mouth 2 (two) times daily.     lipase/protease/amylase 12000 UNITS Cpep capsule  Commonly known as:  CREON-12/PANCREASE  Take 3 capsules by mouth 3 (three) times daily before meals.     naproxen sodium 220 MG tablet  Commonly known as:  ANAPROX  Take 220 mg by mouth once as needed (pain).     pantoprazole 40 MG tablet  Commonly known as:  PROTONIX  Take 1 tablet (40 mg total) by mouth 2 (two) times daily.     sildenafil 50 MG tablet  Commonly known as:  VIAGRA  Take 50 mg by mouth daily as needed for  erectile dysfunction.          The results of significant diagnostics from this hospitalization (including imaging, microbiology, ancillary and laboratory) are listed below for reference.    Significant Diagnostic Studies: US Abdomen Complete  09/05/2013   CLINICAL DATA:  Epigastric pain.  EXAM: ULTRASOUND ABDOMEN COMPLETE  COMPARISON:  CT ABD/PELVIS W CM dated 09/04/2013  FINDINGS: Gallbladder:  There is a calcified non mobile stone near the gallbladder neck. It measures 1.1 cm in greatest dimension. There is no gallbladder wall thickening nor pericholecystic fluid nor positive sonographic Murphy's sign.  Common bile duct:  Diameter: 8.4 mm evaluation of the common bile duct was limited due to bowel gas and surrounding structures.  Liver:  The echotexture of the liver is mildly increased. No discrete cystic or solid masses are demonstrated. No intrahepatic ductal  dilation is demonstrated.  IVC:  No abnormality visualized.  Pancreas:  Bowel gas limits evaluation of the pancreas. The pancreatic duct is mildly dilated at 5.4 mm.  Spleen:  Size and appearance within normal limits.  Right Kidney:  Length: 11.1 cm. Echogenicity within normal limits. No hydronephrosis visualized. There is a midpole cyst 2.7 in greatest dimension  Left Kidney:  Length: 11.1. Echogenicity within normal limits. No hydronephrosis visualized. There is a midpole cyst measuring 1.2 cm in greatest dimension.  Abdominal aorta:  No aneurysm visualized.  Other findings:  No ascites is demonstrated.  IMPRESSION: 1. Evaluation the pancreas is limited by bowel gas. There is pancreatic ductal dilation. Presumed pseudocyst demonstrated on the CT scan of 25 February are not demonstrated on this study. 2. The gallbladder contains a stone which may be impacted in the gallbladder neck but there is no sonographic evidence of acute cholecystitis. 3. The common bile duct is dilated to 8.4 mm where visualized. 4. The kidneys exhibit no evidence of  obstruction. There are simple appearing midpole cyst bilaterally.   Electronically Signed   By: David  Martinique   On: 09/05/2013 19:37   Ct Abdomen Pelvis W Contrast  09/04/2013   CLINICAL DATA:  Mid abdominal pain with nausea.  EXAM: CT ABDOMEN AND PELVIS WITH CONTRAST  TECHNIQUE: Multidetector CT imaging of the abdomen and pelvis was performed using the standard protocol following bolus administration of intravenous contrast.  CONTRAST:  64mL OMNIPAQUE IOHEXOL 300 MG/ML SOLN, 123mL OMNIPAQUE IOHEXOL 300 MG/ML SOLN  COMPARISON:  08/29/2013  FINDINGS: Since the prior study, inflammatory changes surround the pancreas have increased. A lobulated cyst within the constant process and pancreatic head is mildly larger, currently measuring 2.6 cm x 2.4 cm where it had measured 2.3 cm x 2.3 cm. There are multiple irregular collections that are noted within the mesentery, adjacent to the greater curvature of the stomach, along the central small bowel mesentery, more inferiorly within the small bowel mesentery, and in the retroperitoneum posterior inferior to the right kidney and below the left kidney adjacent to the left psoas muscle. These are essentially stable. No new collection/pseudocyst. No generalized ascites.  Atelectasis at the left lung base has increased from the prior study there is milder stable atelectasis at the posterior right lung base.  Small low-density liver lesions at the dome of the liver are stable, likely cysts. Liver is otherwise unremarkable. Normal spleen.  Gallstones and sludge are evident, also stable. No gallbladder wall thickening or evidence acute cholecystitis. There is no bile duct dilation.  Mild adrenal gland nodularity most likely mild nodular hyperplasia. This is stable.  Stable renal cyst. Kidneys are otherwise unremarkable. No hydronephrosis.  There are multiple colonic diverticula mostly along the sigmoid colon. No diverticulitis or colonic inflammation. Small bowel is mildly  prominent with air-fluid levels consistent with a mild adynamic ileus. Normal appendix.  There are dense aortoiliac calcifications. There are significant degenerative changes throughout the visualized spine. No osteoblastic or osteolytic lesions.  IMPRESSION: 1. Inflammatory changes surrounding the pancreas have increased from prior study consistent with recurrent, mild, acute pancreatitis complicating changes from subacute to chronic pancreatitis. There is a mild associated small bowel adynamic ileus reflected by small bowel prominence in air-fluid levels. 2. A presumed pseudocyst in the pancreatic head and uncinate process has mildly increased in size. Other irregular collections consistent with additional pseudocysts in the peritoneal cavity and both retroperitoneum are essentially stable. 3. Increased atelectasis in the left lung base since the prior  study. 4. No other change from the prior study. Chronic findings include gallstones and sludge, low-density liver lesions that are likely cysts, renal cysts, colonic diverticula without diverticulitis and significant degenerative changes of the visualized spine.   Electronically Signed   By: Lajean Manes M.D.   On: 09/04/2013 18:52   Ct Abdomen Pelvis W Contrast  08/29/2013   CLINICAL DATA:  Acute pancreatitis.  EXAM: CT ABDOMEN AND PELVIS WITH CONTRAST  TECHNIQUE: Multidetector CT imaging of the abdomen and pelvis was performed using the standard protocol following bolus administration of intravenous contrast.  CONTRAST:  12mL OMNIPAQUE IOHEXOL 300 MG/ML  SOLN  COMPARISON:  CT scan dated 08/07/2013 and 07/17/2013  FINDINGS: Atelectasis at the left base has completely resolved. Minimal residual focal atelectasis at the right base posteriorly.  The multiple mesenteric pseudocysts have slightly diminished in size. The perinephric pseudocysts demonstrate minimal decrease in size. Drainage catheter in the pseudocyst posterior to the upper pole of the right kidney  has been removed. There is a tiny recurrent amount of fluid in that space.  The pseudocyst in the lesser sac is smaller than on the prior study.  The multi cystic lesion in the uncinate process of the pancreas now measures 2.3 x 2.3 cm, diminished from the study of 07/18/2003 but essentially unchanged since the study of 08/07/2013  Liver, spleen, adrenal glands, and kidneys are stable. Multiple stones are again noted in the nondistended gallbladder. Bile ducts are not dilated.  The patient has a 6 cm segment of mucosal edema in the distal sigmoid portion in the colon consistent with either colitis or diverticulitis but there is no pericolonic soft tissue stranding. The appearance is essentially unchanged since the prior exam.  No acute osseous abnormality.  Spinal cord stimulator is noted.  IMPRESSION: 1. Interval overall slight decrease in the multiple pseudocysts. 2. Stable multicystic lesion in the uncinate process of the pancreas consistent with a parenchymal pancreatic pseudocyst. 3. Persistent edema in the distal sigmoid colon mucosa.   Electronically Signed   By: Rozetta Nunnery M.D.   On: 08/29/2013 15:52    Labs:  Basic Metabolic Panel:  Recent Labs Lab 09/04/13 1455 09/05/13 0335 09/06/13 0315 09/07/13 0430 09/09/13 0355  NA 140 139 137 137 143  K 4.4 3.9 3.9 3.4* 3.5*  CL 103 104 101 100 110  CO2 23 23 20 20 21   GLUCOSE 108* 79 57* 76 111*  BUN 11 8 6  3* <3*  CREATININE 0.72 0.66 0.58 0.61 0.60  CALCIUM 10.5 9.0 9.1 9.0 8.7   GFR Estimated Creatinine Clearance: 87.2 ml/min (by C-G formula based on Cr of 0.6). Liver Function Tests:  Recent Labs Lab 09/04/13 1455 09/06/13 0315  AST 15 17  ALT 6 6  ALKPHOS 73 57  BILITOT 0.4 0.5  PROT 7.1 5.7*  ALBUMIN 3.5 2.7*    Recent Labs Lab 09/04/13 1455 09/05/13 0335 09/07/13 0430 09/08/13 0407 09/09/13 0355  LIPASE >3000* 1816* 530* 518* 50   CBC:  Recent Labs Lab 09/04/13 1455 09/06/13 0315  WBC 10.0 8.5  NEUTROABS  7.6  --   HGB 14.2 12.5*  HCT 42.6 36.4*  MCV 96.4 94.5  PLT 240 194   CBG:  Recent Labs Lab 09/08/13 0019 09/08/13 0744 09/08/13 1643 09/08/13 2357 09/09/13 0722  GLUCAP 111* 103* 100* 107* 117*    Time coordinating discharge: 35 minutes.  Signed:  RAMA,CHRISTINA  Pager [phone] [facility] 09/09/2013, 11:31 AM

## 2013-09-09 NOTE — Progress Notes (Signed)
09/09/13  1245  Reviewed discharge instructions with patient. Patient verbalized understanding of discharge instructions. Copy of discharge papers given to patient.

## 2013-09-10 NOTE — Progress Notes (Signed)
Pt declined offer of copd gold program/card

## 2013-10-01 ENCOUNTER — Encounter (HOSPITAL_COMMUNITY): Payer: Self-pay | Admitting: Pharmacy Technician

## 2013-10-01 ENCOUNTER — Other Ambulatory Visit (HOSPITAL_COMMUNITY): Payer: Self-pay | Admitting: Gastroenterology

## 2013-10-01 ENCOUNTER — Encounter (HOSPITAL_COMMUNITY): Payer: Self-pay | Admitting: *Deleted

## 2013-10-15 ENCOUNTER — Encounter (HOSPITAL_COMMUNITY)
Admission: RE | Admit: 2013-10-15 | Discharge: 2013-10-15 | Disposition: A | Discharge status: Home / Self Care | Payer: Medicare Other | Source: Ambulatory Visit | Attending: Gastroenterology | Admitting: Gastroenterology

## 2013-10-15 DIAGNOSIS — Z01812 Encounter for preprocedural laboratory examination: Secondary | ICD-10-CM | POA: Insufficient documentation

## 2013-10-15 LAB — COMPREHENSIVE METABOLIC PANEL
ALT: 7 U/L (ref 0–53)
AST: 14 U/L (ref 0–37)
Albumin: 3.5 g/dL (ref 3.5–5.2)
Alkaline Phosphatase: 79 U/L (ref 39–117)
BUN: 8 mg/dL (ref 6–23)
CO2: 27 mEq/L (ref 19–32)
Calcium: 9.8 mg/dL (ref 8.4–10.5)
Chloride: 103 mEq/L (ref 96–112)
Creatinine, Ser: 0.75 mg/dL (ref 0.50–1.35)
GFR calc Af Amer: >90 mL/min (ref >90)
GFR calc non Af Amer: >90 mL/min (ref >90)
Glucose, Bld: 85 mg/dL (ref 70–99)
Potassium: 4.2 mEq/L (ref 3.7–5.3)
Sodium: 141 mEq/L (ref 137–147)
Total Bilirubin: 0.2 mg/dL — ABNORMAL LOW (ref 0.3–1.2)
Total Protein: 6.5 g/dL (ref 6.0–8.3)

## 2013-10-15 NOTE — Pre-Procedure Instructions (Signed)
10-15-13 1325 Patient arrived for [facility] lab drawing only.W. Floy Sabina

## 2013-10-16 NOTE — Progress Notes (Signed)
10-16-13 0845 CMP results reviewed-WNL. W. Floy Sabina

## 2013-10-21 ENCOUNTER — Encounter: Payer: Self-pay | Admitting: Nurse Practitioner

## 2013-10-21 ENCOUNTER — Ambulatory Visit (INDEPENDENT_AMBULATORY_CARE_PROVIDER_SITE_OTHER): Payer: Medicare Other | Admitting: Nurse Practitioner

## 2013-10-21 ENCOUNTER — Other Ambulatory Visit: Payer: Self-pay | Admitting: Gastroenterology

## 2013-10-21 VITALS — BP 147/83 | HR 71 | Ht 67.0 in | Wt 158.0 lb

## 2013-10-21 DIAGNOSIS — Z8669 Personal history of other diseases of the nervous system and sense organs: Secondary | ICD-10-CM | POA: Diagnosis not present

## 2013-10-21 DIAGNOSIS — G40309 Generalized idiopathic epilepsy and epileptic syndromes, not intractable, without status epilepticus: Secondary | ICD-10-CM | POA: Diagnosis not present

## 2013-10-21 DIAGNOSIS — R269 Unspecified abnormalities of gait and mobility: Secondary | ICD-10-CM | POA: Diagnosis not present

## 2013-10-21 NOTE — Patient Instructions (Signed)
Continue Lamictal  and Keppra at current doses Followup in 6 months Call for any seizure activity

## 2013-10-21 NOTE — Progress Notes (Addendum)
[facility]  PATIENT: Jeffrey Castillo DOB: [birth year]   REASON FOR VISIT: Followup for seizure disorder and gait abnormality    HISTORY OF PRESENT ILLNESS: Jeffrey Castillo, 63 year old male returns for followup. He was last seen in this office by Dr. Jannifer Castillo 04/16/2013. He has a history of seizure disorder and was on Dilantin for 20 years. In addition he was drinking heavily 1/5 per daily of liquor. Recent CT of the cervical spine did not show any cord compression. CT of the brain 04/23/13 with  minimal small vessel disease, however it was felt combination of Dilantin and alcohol permanently damaged cerebellum leading to his gait disorder. He is walking with a cane. He's also had an admission since last seen for pancreatitis. He claims he has stopped drinking. He has not had seizure activity in several years. He is currently on Keppra and Lamictal. He returns for reevaluation.   HISTORY:  of a gait disorder. The patient is also followed for seizures. The patient has a history of a benign essential tremor as well. The patient was noted to have significant balance issues when he was seen 6 months ago. The patient had been on Dilantin for his seizures on a chronic basis. The patient was taken off of this medication, and he was switched to [place]. The patient however, has continued to drink a significant amounts of alcohol daily, and his wife indicates that he drinks almost 1/5 of liquor daily. The patient denies any numbness in the feet with exception of some occasional tingling in the left foot only. The patient has no burning or stinging in the feet. The patient does have tremors affecting all 4 extremities, and some resting tremor of the left arm. The patient has been seen through physical therapy, and his therapist has been concerned that his walking has deteriorated significantly over the last several months. The patient uses a cane for ambulation, and he will occasionally use a  walker. The patient returns to this office for an evaluation. The patient has undergone a recent CT scan of the cervical spine, and this did not show spinal cord compression.   REVIEW OF SYSTEMS: Full 14 system review of systems performed and notable only for those listed, all others are neg:  Constitutional: N/A  Cardiovascular: N/A  Ear/Nose/Throat: N/A  Skin: N/A  Eyes: N/A  Respiratory: N/A  Gastroitestinal: N/A  Hematology/Lymphatic: Easy bruising Endocrine: N/A Musculoskeletal:N/A  Allergy/Immunology: N/A  Neurological: Numbness Psychiatric: N/A   ALLERGIES: Allergies  Allergen Reactions  . Lyrica [Pregabalin] Other (See Comments)    Hallucinations.    . Sulfonamide Derivatives Other (See Comments)    unknown as a child  . Valsartan Other (See Comments)    lightheaded    HOME MEDICATIONS: Outpatient Prescriptions Prior to Visit  Medication Sig Dispense Refill  . bacitracin-polymyxin b (POLYSPORIN) ointment Apply 1 application topically daily as needed.      . cloNIDine (CATAPRES - DOSED IN MG/24 HR) 0.2 mg/24hr patch Place 1 patch onto the skin once a week.      . lamoTRIgine (LAMICTAL) 25 MG tablet Take 50 mg by mouth 2 (two) times daily.      Marland Kitchen levETIRAcetam (KEPPRA) 500 MG tablet Take 500 mg by mouth 2 (two) times daily.      . lipase/protease/amylase (CREON-12/PANCREASE) 12000 UNITS CPEP capsule Take 3 capsules by mouth 3 (three) times daily before meals.      . naproxen sodium (ANAPROX) 220 MG tablet Take 220 mg by mouth  once as needed (pain).      . pantoprazole (PROTONIX) 40 MG tablet Take 40 mg by mouth 2 (two) times daily.       No facility-administered medications prior to visit.    PAST MEDICAL HISTORY: Past Medical History  Diagnosis Date  . HYPERTENSION 07/31/2009  . Gait disorder   . Neuropathy     feet  . Complex regional pain syndrome of left lower extremity   . Osteopenia   . Memory disorder   . Right humeral fracture   . Essential tremor       hx of  . Alcohol abuse   . COPD (chronic obstructive pulmonary disease)   . 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 Seizures 2006  . Complication of anesthesia 08-05-2013    slow to awaken  . Acute pancreatitis     pseudo cyst    PAST SURGICAL HISTORY: Past Surgical History  Procedure Laterality Date  . Foot surgery Left     2 occasions  . Shoulder surgery Right   . Vasectomy    . Bladder leision    . Bunionectomy Left   . Lumbar spine surgery      2 occasions for left L5 radiculopathy  . Cervical spine surgery  08/2010    C5-6-7  . Spinal cord stimulator implant  02/2011  . Esophagogastroduodenoscopy N/A 08/05/2013    Procedure: ESOPHAGOGASTRODUODENOSCOPY (EGD);  Surgeon: Jeryl Columbia, MD;  Location: [facility];  Service: Endoscopy;  Laterality: N/A;  . Squamous cell carcinoma removed  4 weeks ago    upper left arm, heaing well    FAMILY HISTORY: Family History  Problem Relation Age of Onset  . Colon cancer Father   . Heart disease Brother     SOCIAL HISTORY: History   Social History  . Marital Status: Married    Spouse Name: Rosemarie Ax     Number of Children: 5  . Years of Education: Masters   Occupational History  . [employer]   .    Marland Kitchen Disability     Social History Main Topics  . Smoking status: Current Every Day Smoker -- 1.00 packs/day for 50 years    Types: Cigarettes  . Smokeless tobacco: Never Used  . Alcohol Use: Yes     Comment: 07/17/13 was last drink  . Drug Use: No  . Sexual Activity: No   Other Topics Concern  . Not on file   Social History Narrative   Patient lives at home with wife Rosemarie Ax.    Patient has 5 children.    Patient has a Scientist, water quality.    Patient is currently on disability    Patient is right handed.    Patient drinks 1-1.5 cups of coffee daily.      PHYSICAL EXAM  Filed Vitals:   10/21/13 1035  BP: 147/83  Pulse: 71  Height: 5\' 7"  (1.702 m)  Weight: 158 lb (71.668 kg)   Body mass index is 24.74  kg/(m^2).  Generalized: Well developed, in no acute distress  Musculoskeletal: No deformity Skin multiple areas over forearms and hands of bruising   Neurological examination   Mentation: Alert oriented to time, place, history taking. Follows all commands speech and language fluent  Cranial nerve II-XII: Pupils were equal round reactive to light extraocular movements were full, visual field were full on confrontational test. Facial sensation and strength were normal. hearing was intact to finger rubbing bilaterally. Uvula tongue midline. head turning and shoulder shrug were normal and  symmetric.Tongue protrusion into cheek strength was normal. Motor: normal bulk and tone, full strength in the BUE, BLE,  No focal weakness Sensory: normal and symmetric to light touch, pinprick, and  vibration  Coordination: finger-nose-finger, heel-to-shin bilaterally, no dysmetria. Resting tremor of the left arm and bilateral intention tremor of both upper extremities finger to nose finger. Reflexes: Symmetric upper and lower but depressed , plantar responses were flexor bilaterally. Gait and Station: Rising up from seated position without assistance, wide based stance,  moderate stride, good arm swing, smooth turning, able to perform tiptoe, and heel walking without difficulty. Tandem gait is mildly unsteady    DIAGNOSTIC DATA (LABS, IMAGING, TESTING) - I reviewed patient records, labs, notes, testing and imaging myself where available.  Lab Results  Component Value Date   WBC 8.5 09/06/2013   HGB 12.5* 09/06/2013   HCT 36.4* 09/06/2013   MCV 94.5 09/06/2013   PLT 194 09/06/2013      Component Value Date/Time   NA 141 10/15/2013 1330   K 4.2 10/15/2013 1330   CL 103 10/15/2013 1330   CO2 27 10/15/2013 1330   GLUCOSE 85 10/15/2013 1330   BUN 8 10/15/2013 1330   CREATININE 0.75 10/15/2013 1330   CALCIUM 9.8 10/15/2013 1330   PROT 6.5 10/15/2013 1330   ALBUMIN 3.5 10/15/2013 1330   AST 14 10/15/2013 1330   ALT 7 10/15/2013  1330   ALKPHOS 79 10/15/2013 1330   BILITOT 0.2* 10/15/2013 1330   GFRNONAA >90 10/15/2013 1330   GFRAA >90 10/15/2013 1330    Lab Results  Component Value Date   TSH 4.042 08/02/2013   CT of the head 04/23/13 showing mild changes of chronic microvascular ischemia and generalized cerebral atrophy no acute abnormalities are noted   ASSESSMENT AND PLAN  63 y.o. year old male  has a past medical history of HYPERTENSION (07/31/2009); Gait disorder; Neuropathy; Complex regional pain syndrome of left lower extremity; Osteopenia; Memory disorder; Essential tremor; Alcohol abuse; COPD (chronic obstructive pulmonary disease); Duodenal adenoma (09/07/2013); Seizures (2006); and Acute pancreatitis. here to followup for his gait abnormality and seizure disorder. He is a patient of Dr. Jannifer Castillo who is out of the office today  Continue Lamictal  and Keppra at current doses, does not need refills Followup in 6 months Call for any seizure activity Dennie Bible, [facility], APRN  [facility] [address] [place], [place] [address] [phone]   Agree with above assessment and plan.  Jim Like, D.O.

## 2013-10-21 NOTE — Addendum Note (Signed)
Addended by: Arta Silence on: 10/21/2013 02:49 PM   Modules accepted: Orders

## 2013-10-23 ENCOUNTER — Encounter (HOSPITAL_COMMUNITY)
Admission: RE | Disposition: A | Discharge status: Home / Self Care | Payer: Self-pay | Source: Ambulatory Visit | Attending: Gastroenterology

## 2013-10-23 ENCOUNTER — Encounter (HOSPITAL_COMMUNITY): Payer: Medicare Other | Admitting: Anesthesiology

## 2013-10-23 ENCOUNTER — Ambulatory Visit (HOSPITAL_COMMUNITY): Payer: Medicare Other | Admitting: Anesthesiology

## 2013-10-23 ENCOUNTER — Encounter (HOSPITAL_COMMUNITY): Payer: Self-pay | Admitting: *Deleted

## 2013-10-23 ENCOUNTER — Ambulatory Visit (HOSPITAL_COMMUNITY)
Admission: RE | Admit: 2013-10-23 | Discharge: 2013-10-23 | Disposition: A | Discharge status: Home / Self Care | Payer: Medicare Other | Source: Ambulatory Visit | Attending: Gastroenterology | Admitting: Gastroenterology

## 2013-10-23 DIAGNOSIS — K319 Disease of stomach and duodenum, unspecified: Secondary | ICD-10-CM | POA: Diagnosis not present

## 2013-10-23 DIAGNOSIS — D126 Benign neoplasm of colon, unspecified: Secondary | ICD-10-CM | POA: Diagnosis not present

## 2013-10-23 DIAGNOSIS — Z8711 Personal history of peptic ulcer disease: Secondary | ICD-10-CM | POA: Insufficient documentation

## 2013-10-23 DIAGNOSIS — R933 Abnormal findings on diagnostic imaging of other parts of digestive tract: Secondary | ICD-10-CM | POA: Diagnosis not present

## 2013-10-23 DIAGNOSIS — I10 Essential (primary) hypertension: Secondary | ICD-10-CM | POA: Diagnosis not present

## 2013-10-23 DIAGNOSIS — F1010 Alcohol abuse, uncomplicated: Secondary | ICD-10-CM | POA: Insufficient documentation

## 2013-10-23 DIAGNOSIS — R109 Unspecified abdominal pain: Secondary | ICD-10-CM | POA: Diagnosis not present

## 2013-10-23 DIAGNOSIS — J449 Chronic obstructive pulmonary disease, unspecified: Secondary | ICD-10-CM | POA: Insufficient documentation

## 2013-10-23 DIAGNOSIS — F17200 Nicotine dependence, unspecified, uncomplicated: Secondary | ICD-10-CM | POA: Insufficient documentation

## 2013-10-23 DIAGNOSIS — K828 Other specified diseases of gallbladder: Secondary | ICD-10-CM | POA: Diagnosis not present

## 2013-10-23 DIAGNOSIS — K862 Cyst of pancreas: Secondary | ICD-10-CM | POA: Diagnosis not present

## 2013-10-23 DIAGNOSIS — D1330 Benign neoplasm of unspecified part of small intestine: Secondary | ICD-10-CM | POA: Diagnosis not present

## 2013-10-23 DIAGNOSIS — K863 Pseudocyst of pancreas: Secondary | ICD-10-CM | POA: Diagnosis not present

## 2013-10-23 DIAGNOSIS — K859 Acute pancreatitis without necrosis or infection, unspecified: Secondary | ICD-10-CM | POA: Insufficient documentation

## 2013-10-23 DIAGNOSIS — J4489 Other specified chronic obstructive pulmonary disease: Secondary | ICD-10-CM | POA: Insufficient documentation

## 2013-10-23 HISTORY — PX: EUS: SHX5427

## 2013-10-23 HISTORY — DX: Acute pancreatitis without necrosis or infection, unspecified: K85.90

## 2013-10-23 HISTORY — DX: Adverse effect of unspecified anesthetic, initial encounter: T41.45XA

## 2013-10-23 SURGERY — ESOPHAGEAL ENDOSCOPIC ULTRASOUND (EUS) RADIAL
Anesthesia: Monitor Anesthesia Care

## 2013-10-23 MED ORDER — KETAMINE HCL 10 MG/ML IJ SOLN
INTRAMUSCULAR | Status: DC | PRN
  Administered 2013-10-23 (×2): 10 mg via INTRAVENOUS

## 2013-10-23 MED ORDER — PROPOFOL 10 MG/ML IV BOLUS
INTRAVENOUS | Status: AC
  Filled 2013-10-23: qty 20

## 2013-10-23 MED ORDER — MIDAZOLAM HCL 2 MG/2ML IJ SOLN
INTRAMUSCULAR | Status: AC
  Filled 2013-10-23: qty 2

## 2013-10-23 MED ORDER — MIDAZOLAM HCL 5 MG/5ML IJ SOLN
INTRAMUSCULAR | Status: DC | PRN
  Administered 2013-10-23 (×2): 1 mg via INTRAVENOUS

## 2013-10-23 MED ORDER — BUTAMBEN-TETRACAINE-BENZOCAINE 2-2-14 % EX AERO
INHALATION_SPRAY | CUTANEOUS | Status: DC | PRN
  Administered 2013-10-23: 1 via TOPICAL

## 2013-10-23 MED ORDER — LIDOCAINE HCL 1 % IJ SOLN
INTRAMUSCULAR | Status: DC | PRN
  Administered 2013-10-23: 50 mg via INTRADERMAL

## 2013-10-23 MED ORDER — SODIUM CHLORIDE 0.9 % IV SOLN: INTRAVENOUS | Status: DC

## 2013-10-23 MED ORDER — LACTATED RINGERS IV SOLN
INTRAVENOUS | Status: DC
  Administered 2013-10-23: 09:00:00 via INTRAVENOUS

## 2013-10-23 MED ORDER — PROPOFOL INFUSION 10 MG/ML OPTIME
INTRAVENOUS | Status: DC | PRN
  Administered 2013-10-23: 125 ug/kg/min via INTRAVENOUS

## 2013-10-23 MED ORDER — FENTANYL CITRATE 0.05 MG/ML IJ SOLN
INTRAMUSCULAR | Status: DC | PRN
  Administered 2013-10-23 (×2): 50 ug via INTRAVENOUS

## 2013-10-23 MED ORDER — LIDOCAINE HCL (CARDIAC) 20 MG/ML IV SOLN
INTRAVENOUS | Status: AC
  Filled 2013-10-23: qty 5

## 2013-10-23 MED ORDER — FENTANYL CITRATE 0.05 MG/ML IJ SOLN
INTRAMUSCULAR | Status: AC
  Filled 2013-10-23: qty 2

## 2013-10-23 NOTE — Discharge Instructions (Signed)
Endoscopy/Endoscopic ultrasound ° °Care After °Please read the instructions outlined below and refer to this sheet in the next few weeks. These discharge instructions provide you with general information on caring for yourself after you leave the hospital. Your doctor may also give you specific instructions. While your treatment has been planned according to the most current medical practices available, unavoidable complications occasionally occur. If you have any problems or questions after discharge, please call Dr. Elonda Giuliano ([facility]) at [phone]. ° °HOME CARE INSTRUCTIONS °Activity °· You may resume your regular activity but move at a slower pace for the next 24 hours.  °· Take frequent rest periods for the next 24 hours.  °· Walking will help expel (get rid of) the air and reduce the bloated feeling in your abdomen.  °· No driving for 24 hours (because of the anesthesia (medicine) used during the test).  °· You may shower.  °· Do not sign any important legal documents or operate any machinery for 24 hours (because of the anesthesia used during the test).  °Nutrition °· Drink plenty of fluids.  °· You may resume your normal diet.  °· Begin with a light meal and progress to your normal diet.  °· Avoid alcoholic beverages for 24 hours or as instructed by your caregiver.  °Medications °You may resume your normal medications unless your caregiver tells you otherwise. °What you can expect today °· You may experience abdominal discomfort such as a feeling of fullness or "gas" pains.  °· You may experience a sore throat for 2 to 3 days. This is normal. Gargling with salt water may help this.  °·  °SEEK IMMEDIATE MEDICAL CARE IF: °· You have excessive nausea (feeling sick to your stomach) and/or vomiting.  °· You have severe abdominal pain and distention (swelling).  °· You have trouble swallowing.  °· You have a temperature over 100° F (37.8° C).  °· You have rectal bleeding or vomiting of blood.   °Document Released: 02/09/2004 Document Revised: 03/09/2011 Document Reviewed: 08/22/2007 °ExitCare® Patient Information ©2012 [employer]. °

## 2013-10-23 NOTE — Transfer of Care (Signed)
Immediate Anesthesia Transfer of Care Note  Patient: Jeffrey Castillo  Procedure(s) Performed: Procedure(s): ESOPHAGEAL ENDOSCOPIC ULTRASOUND (EUS) RADIAL (N/A)  Patient Location: PACU and Endoscopy Unit  Anesthesia Type:MAC  Level of Consciousness: awake, alert , oriented and patient cooperative  Airway & Oxygen Therapy: Patient Spontanous Breathing and Patient connected to nasal cannula oxygen  Post-op Assessment: Report given to PACU RN and Post -op Vital signs reviewed and stable  Post vital signs: Reviewed and stable  Complications: No apparent anesthesia complications

## 2013-10-23 NOTE — Anesthesia Postprocedure Evaluation (Signed)
Anesthesia Post Note  Patient: Jeffrey Castillo  Procedure(s) Performed: Procedure(s) (LRB): ESOPHAGEAL ENDOSCOPIC ULTRASOUND (EUS) RADIAL (N/A)  Anesthesia type: MAC  Patient location: PACU  Post pain: Pain level controlled  Post assessment: Post-op Vital signs reviewed  Last Vitals:  Filed Vitals:   10/23/13 1040  BP: 147/80  Temp:   Resp: 22    Post vital signs: Reviewed  Level of consciousness: sedated  Complications: No apparent anesthesia complications

## 2013-10-23 NOTE — Anesthesia Preprocedure Evaluation (Addendum)
Anesthesia Evaluation  Patient identified by MRN, date of birth, ID band Patient awake    Reviewed: Allergy & Precautions, H&P , NPO status , Patient's Chart, lab work & pertinent test results  History of Anesthesia Complications (+) PROLONGED EMERGENCE and history of anesthetic complications  Airway Mallampati: II TM Distance: >3 FB Neck ROM: Full    Dental  (+) Caps, Dental Advisory Given   Pulmonary COPDCurrent Smoker,    Pulmonary exam normal       Cardiovascular hypertension, Pt. on medications Rhythm:Regular Rate:Normal     Neuro/Psych Seizures -,  Chronic pain  Neuromuscular disease negative psych ROS   GI/Hepatic PUD, (+)     substance abuse  alcohol use,   Endo/Other  negative endocrine ROS  Renal/GU negative Renal ROS  negative genitourinary   Musculoskeletal negative musculoskeletal ROS (+)   Abdominal   Peds  Hematology negative hematology ROS (+)   Anesthesia Other Findings   Reproductive/Obstetrics                         Anesthesia Physical Anesthesia Plan  ASA: III  Anesthesia Plan: MAC   Post-op Pain Management:    Induction: Intravenous  Airway Management Planned: Nasal Cannula  Additional Equipment:   Intra-op Plan:   Post-operative Plan:   Informed Consent: I have reviewed the patients History and Physical, chart, labs and discussed the procedure including the risks, benefits and alternatives for the proposed anesthesia with the patient or authorized representative who has indicated his/her understanding and acceptance.   Dental advisory given  Plan Discussed with: CRNA  Anesthesia Plan Comments:         Anesthesia Quick Evaluation

## 2013-10-23 NOTE — H&P (Signed)
Patient interval history reviewed.  Patient examined again.  There has been no change from documented H/P (scanned into chart from our office) except as documented above.  Assessment:  1.  Pancreatitis. 2.  Duodenal adenoma. 3.  Pancreatic pseudocysts.  Plan:  1.  Endoscopic ultrasound with possible mucosal biopsies or fine needle aspiration. 2.  Risks (bleeding, infection, bowel perforation that could require surgery, sedation-related changes in cardiopulmonary systems), benefits (identification and possible treatment of source of symptoms, exclusion of certain causes of symptoms), and alternatives (watchful waiting, radiographic imaging studies, empiric medical treatment) of upper endoscopy with possible biopsies or FNA (EUS +/- biopsies +/- FNA) were explained to patient/family in detail and patient wishes to proceed.

## 2013-10-23 NOTE — Op Note (Signed)
[place], [address]   ENDOSCOPIC ULTRASOUND PROCEDURE REPORT  PATIENT: Jeffrey Castillo, Jeffrey Castillo  MR#: [id number] BIRTHDATE: 12-29-1950  GENDER: Male ENDOSCOPIST: Arta Silence, MD REFERRED BY:  Kathyrn Lass, M.D. , Teena Irani, MD PROCEDURE DATE:  10/23/2013 PROCEDURE:   Upper EUS ASA CLASS:      Class III INDICATIONS:   1.  pancreatitis, duodenal adenoma. MEDICATIONS: MAC sedation, administered by CRNA and Cetacaine spray x 2 DESCRIPTION OF PROCEDURE:   After the risks benefits and alternatives of the procedure were  explained, informed consent was obtained. The patient was then placed in the left, lateral, decubitus postion and IV sedation was administered. Throughout the procedure, the patients blood pressure, pulse and oxygen saturations were monitored continuously.  Under direct visualization, the Pentax Linear [id number]  endoscope was introduced through the mouth  and advanced to the second portion of the duodenum .  Water was used as necessary to provide an acoustic interface.  Upon completion of the imaging, water was removed and the patient was sent to the recovery room in satisfactory condition.   FINDINGS:      2 x 3cm duodenal polyp, flat, soft to probing; not friable and not ulcerated.  The polyp at the same distal level as, but is on the opposite wall of, the ampulla.  The polyp is about 2 x 3cm in size, appears mucosally-based and doesn't appear to involve the muscularis propria. The pancreatic body and tail had extensive lobularity, pancreatic ductal ectasias, visible pancreatic duct side branches; this could represent resolving acute pancreatitis, but incipient chronic pancreatitis (especially with the lobularity) is certainly a consideration.  There were a couple amorphous fluid collections, each about 15 x 20 mm in size, in the head and uncinate pancreas, with indwelling debris.  Bile duct not dilated; no bile duct wall  thickening; in the majority of views the bile duct appeared completely clear, but in a couple views, there was suggestion of possible non-shadowing distal bile duct stone. However, the bile duct, in this region, traverses in close proximity to one of the pancreatic head fluid collections; thus, the non-shadowing region might in fact represent debris from the neighboring cystic region.  The gallbladder has significant amount of sludge and stone debris in the dependent areas.  No obvious pancreatic mass was seen, but exclusion of pancreatic mass in setting of extensive pancreatitis is relatively challenging.  IMPRESSION:     As above.  Suspect pancreatitis is alcohol-related (especially since LFTs were essentially normal at time of his acute pancreatitis).  Suspect patient likely also has incipient chronic pancreatitis, again likely from alcohol.  Duodenal polyp appears mucosally-based, and potentially endoscopically removable via endoscopic mucosal resection.  RECOMMENDATIONS:     1.  Watch for potential complications of procedure. 2.  Alcohol abstinence. 3.  If patient has another attack of pancreatitis, in absence of alcohol consumption, would consider cholecystectomy with intraoperative cholangiogram. 4.  Will need to consider surgical versus endoscopic resection of duodenal adenoma, electively sometime in the next few months. 5.  Will discuss with Dr. Amedeo Plenty.   _______________________________ Lorrin MaisArta Silence, MD 10/23/2013 10:30 AM   CC:  PATIENT NAME:  Jeffrey, Castillo MR#: [id number]

## 2013-10-24 ENCOUNTER — Encounter (HOSPITAL_COMMUNITY): Payer: Self-pay | Admitting: Gastroenterology

## 2013-10-25 DIAGNOSIS — Z85828 Personal history of other malignant neoplasm of skin: Secondary | ICD-10-CM | POA: Diagnosis not present

## 2013-10-25 DIAGNOSIS — L570 Actinic keratosis: Secondary | ICD-10-CM | POA: Diagnosis not present

## 2013-12-16 DIAGNOSIS — D1330 Benign neoplasm of unspecified part of small intestine: Secondary | ICD-10-CM | POA: Diagnosis not present

## 2013-12-16 DIAGNOSIS — K859 Acute pancreatitis without necrosis or infection, unspecified: Secondary | ICD-10-CM | POA: Diagnosis not present

## 2013-12-16 DIAGNOSIS — Z86010 Personal history of colonic polyps: Secondary | ICD-10-CM | POA: Diagnosis not present

## 2013-12-16 DIAGNOSIS — K8689 Other specified diseases of pancreas: Secondary | ICD-10-CM | POA: Diagnosis not present

## 2014-01-09 DIAGNOSIS — K7460 Unspecified cirrhosis of liver: Secondary | ICD-10-CM | POA: Diagnosis not present

## 2014-01-09 DIAGNOSIS — Z888 Allergy status to other drugs, medicaments and biological substances status: Secondary | ICD-10-CM | POA: Diagnosis not present

## 2014-01-09 DIAGNOSIS — D1330 Benign neoplasm of unspecified part of small intestine: Secondary | ICD-10-CM | POA: Diagnosis not present

## 2014-01-09 DIAGNOSIS — Z882 Allergy status to sulfonamides status: Secondary | ICD-10-CM | POA: Diagnosis not present

## 2014-01-09 DIAGNOSIS — K7030 Alcoholic cirrhosis of liver without ascites: Secondary | ICD-10-CM | POA: Diagnosis not present

## 2014-01-09 DIAGNOSIS — Z86010 Personal history of colonic polyps: Secondary | ICD-10-CM | POA: Diagnosis not present

## 2014-01-09 DIAGNOSIS — I10 Essential (primary) hypertension: Secondary | ICD-10-CM | POA: Diagnosis not present

## 2014-01-09 DIAGNOSIS — J449 Chronic obstructive pulmonary disease, unspecified: Secondary | ICD-10-CM | POA: Diagnosis not present

## 2014-01-21 DIAGNOSIS — L819 Disorder of pigmentation, unspecified: Secondary | ICD-10-CM | POA: Diagnosis not present

## 2014-01-21 DIAGNOSIS — L578 Other skin changes due to chronic exposure to nonionizing radiation: Secondary | ICD-10-CM | POA: Diagnosis not present

## 2014-01-21 DIAGNOSIS — D235 Other benign neoplasm of skin of trunk: Secondary | ICD-10-CM | POA: Diagnosis not present

## 2014-01-21 DIAGNOSIS — L821 Other seborrheic keratosis: Secondary | ICD-10-CM | POA: Diagnosis not present

## 2014-01-30 DIAGNOSIS — M545 Low back pain, unspecified: Secondary | ICD-10-CM | POA: Diagnosis not present

## 2014-01-30 DIAGNOSIS — G894 Chronic pain syndrome: Secondary | ICD-10-CM | POA: Diagnosis not present

## 2014-02-06 DIAGNOSIS — M545 Low back pain, unspecified: Secondary | ICD-10-CM | POA: Diagnosis not present

## 2014-02-06 DIAGNOSIS — IMO0002 Reserved for concepts with insufficient information to code with codable children: Secondary | ICD-10-CM | POA: Diagnosis not present

## 2014-02-06 DIAGNOSIS — G894 Chronic pain syndrome: Secondary | ICD-10-CM | POA: Diagnosis not present

## 2014-02-10 ENCOUNTER — Other Ambulatory Visit: Payer: Self-pay

## 2014-02-10 MED ORDER — LAMOTRIGINE 25 MG PO TABS: 50.0000 mg | ORAL_TABLET | Freq: Two times a day (BID) | ORAL | Status: DC

## 2014-02-10 MED ORDER — LEVETIRACETAM 500 MG PO TABS: 500.0000 mg | ORAL_TABLET | Freq: Two times a day (BID) | ORAL | Status: DC

## 2014-02-11 DIAGNOSIS — M545 Low back pain, unspecified: Secondary | ICD-10-CM | POA: Diagnosis not present

## 2014-02-11 DIAGNOSIS — IMO0002 Reserved for concepts with insufficient information to code with codable children: Secondary | ICD-10-CM | POA: Diagnosis not present

## 2014-02-11 DIAGNOSIS — G894 Chronic pain syndrome: Secondary | ICD-10-CM | POA: Diagnosis not present

## 2014-02-12 DIAGNOSIS — M545 Low back pain, unspecified: Secondary | ICD-10-CM | POA: Diagnosis not present

## 2014-02-12 DIAGNOSIS — G894 Chronic pain syndrome: Secondary | ICD-10-CM | POA: Diagnosis not present

## 2014-02-12 DIAGNOSIS — IMO0002 Reserved for concepts with insufficient information to code with codable children: Secondary | ICD-10-CM | POA: Diagnosis not present

## 2014-02-17 DIAGNOSIS — M545 Low back pain, unspecified: Secondary | ICD-10-CM | POA: Diagnosis not present

## 2014-02-17 DIAGNOSIS — G894 Chronic pain syndrome: Secondary | ICD-10-CM | POA: Diagnosis not present

## 2014-02-17 DIAGNOSIS — IMO0002 Reserved for concepts with insufficient information to code with codable children: Secondary | ICD-10-CM | POA: Diagnosis not present

## 2014-02-18 DIAGNOSIS — D1330 Benign neoplasm of unspecified part of small intestine: Secondary | ICD-10-CM | POA: Diagnosis not present

## 2014-02-19 ENCOUNTER — Other Ambulatory Visit: Payer: Self-pay | Admitting: Family Medicine

## 2014-02-19 DIAGNOSIS — Z125 Encounter for screening for malignant neoplasm of prostate: Secondary | ICD-10-CM | POA: Diagnosis not present

## 2014-02-19 DIAGNOSIS — M949 Disorder of cartilage, unspecified: Secondary | ICD-10-CM | POA: Diagnosis not present

## 2014-02-19 DIAGNOSIS — M899 Disorder of bone, unspecified: Secondary | ICD-10-CM | POA: Diagnosis not present

## 2014-02-19 DIAGNOSIS — Z136 Encounter for screening for cardiovascular disorders: Secondary | ICD-10-CM

## 2014-02-19 DIAGNOSIS — F1021 Alcohol dependence, in remission: Secondary | ICD-10-CM | POA: Diagnosis not present

## 2014-02-19 DIAGNOSIS — Z0000 Encounter for general adult medical examination without abnormal findings: Secondary | ICD-10-CM | POA: Diagnosis not present

## 2014-02-19 DIAGNOSIS — F17200 Nicotine dependence, unspecified, uncomplicated: Secondary | ICD-10-CM | POA: Diagnosis not present

## 2014-02-19 DIAGNOSIS — G9050 Complex regional pain syndrome I, unspecified: Secondary | ICD-10-CM | POA: Diagnosis not present

## 2014-02-19 DIAGNOSIS — K8689 Other specified diseases of pancreas: Secondary | ICD-10-CM | POA: Diagnosis not present

## 2014-02-19 DIAGNOSIS — R569 Unspecified convulsions: Secondary | ICD-10-CM | POA: Diagnosis not present

## 2014-02-19 DIAGNOSIS — Z23 Encounter for immunization: Secondary | ICD-10-CM | POA: Diagnosis not present

## 2014-02-21 DIAGNOSIS — M545 Low back pain, unspecified: Secondary | ICD-10-CM | POA: Diagnosis not present

## 2014-02-21 DIAGNOSIS — G894 Chronic pain syndrome: Secondary | ICD-10-CM | POA: Diagnosis not present

## 2014-02-21 DIAGNOSIS — IMO0002 Reserved for concepts with insufficient information to code with codable children: Secondary | ICD-10-CM | POA: Diagnosis not present

## 2014-02-24 DIAGNOSIS — K859 Acute pancreatitis without necrosis or infection, unspecified: Secondary | ICD-10-CM | POA: Diagnosis not present

## 2014-02-24 DIAGNOSIS — D1330 Benign neoplasm of unspecified part of small intestine: Secondary | ICD-10-CM | POA: Diagnosis not present

## 2014-02-24 DIAGNOSIS — Z86010 Personal history of colonic polyps: Secondary | ICD-10-CM | POA: Diagnosis not present

## 2014-02-25 DIAGNOSIS — M545 Low back pain, unspecified: Secondary | ICD-10-CM | POA: Diagnosis not present

## 2014-02-25 DIAGNOSIS — IMO0002 Reserved for concepts with insufficient information to code with codable children: Secondary | ICD-10-CM | POA: Diagnosis not present

## 2014-02-25 DIAGNOSIS — G894 Chronic pain syndrome: Secondary | ICD-10-CM | POA: Diagnosis not present

## 2014-02-27 ENCOUNTER — Ambulatory Visit: Payer: Self-pay

## 2014-02-27 DIAGNOSIS — IMO0002 Reserved for concepts with insufficient information to code with codable children: Secondary | ICD-10-CM | POA: Diagnosis not present

## 2014-02-27 DIAGNOSIS — G894 Chronic pain syndrome: Secondary | ICD-10-CM | POA: Diagnosis not present

## 2014-02-27 DIAGNOSIS — M545 Low back pain, unspecified: Secondary | ICD-10-CM | POA: Diagnosis not present

## 2014-03-03 ENCOUNTER — Ambulatory Visit
Admission: RE | Admit: 2014-03-03 | Discharge: 2014-03-03 | Disposition: A | Discharge status: Home / Self Care | Payer: Medicare Other | Source: Ambulatory Visit | Attending: Family Medicine | Admitting: Family Medicine

## 2014-03-03 DIAGNOSIS — Z136 Encounter for screening for cardiovascular disorders: Secondary | ICD-10-CM | POA: Diagnosis not present

## 2014-03-03 DIAGNOSIS — I700 Atherosclerosis of aorta: Secondary | ICD-10-CM | POA: Diagnosis not present

## 2014-03-04 DIAGNOSIS — G894 Chronic pain syndrome: Secondary | ICD-10-CM | POA: Diagnosis not present

## 2014-03-04 DIAGNOSIS — M545 Low back pain, unspecified: Secondary | ICD-10-CM | POA: Diagnosis not present

## 2014-03-04 DIAGNOSIS — IMO0002 Reserved for concepts with insufficient information to code with codable children: Secondary | ICD-10-CM | POA: Diagnosis not present

## 2014-03-06 DIAGNOSIS — M899 Disorder of bone, unspecified: Secondary | ICD-10-CM | POA: Diagnosis not present

## 2014-03-07 DIAGNOSIS — M545 Low back pain, unspecified: Secondary | ICD-10-CM | POA: Diagnosis not present

## 2014-03-07 DIAGNOSIS — G894 Chronic pain syndrome: Secondary | ICD-10-CM | POA: Diagnosis not present

## 2014-03-07 DIAGNOSIS — IMO0002 Reserved for concepts with insufficient information to code with codable children: Secondary | ICD-10-CM | POA: Diagnosis not present

## 2014-03-11 DIAGNOSIS — D1330 Benign neoplasm of unspecified part of small intestine: Secondary | ICD-10-CM | POA: Diagnosis not present

## 2014-03-11 DIAGNOSIS — K5730 Diverticulosis of large intestine without perforation or abscess without bleeding: Secondary | ICD-10-CM | POA: Diagnosis not present

## 2014-03-11 DIAGNOSIS — M799 Soft tissue disorder, unspecified: Secondary | ICD-10-CM | POA: Diagnosis not present

## 2014-03-18 DIAGNOSIS — K648 Other hemorrhoids: Secondary | ICD-10-CM | POA: Diagnosis not present

## 2014-03-18 DIAGNOSIS — K5730 Diverticulosis of large intestine without perforation or abscess without bleeding: Secondary | ICD-10-CM | POA: Diagnosis not present

## 2014-03-18 DIAGNOSIS — Z800 Family history of malignant neoplasm of digestive organs: Secondary | ICD-10-CM | POA: Diagnosis not present

## 2014-03-18 DIAGNOSIS — Z1211 Encounter for screening for malignant neoplasm of colon: Secondary | ICD-10-CM | POA: Diagnosis not present

## 2014-03-19 DIAGNOSIS — M545 Low back pain, unspecified: Secondary | ICD-10-CM | POA: Diagnosis not present

## 2014-03-19 DIAGNOSIS — G894 Chronic pain syndrome: Secondary | ICD-10-CM | POA: Diagnosis not present

## 2014-03-19 DIAGNOSIS — IMO0002 Reserved for concepts with insufficient information to code with codable children: Secondary | ICD-10-CM | POA: Diagnosis not present

## 2014-03-21 DIAGNOSIS — G894 Chronic pain syndrome: Secondary | ICD-10-CM | POA: Diagnosis not present

## 2014-03-21 DIAGNOSIS — M545 Low back pain, unspecified: Secondary | ICD-10-CM | POA: Diagnosis not present

## 2014-03-21 DIAGNOSIS — IMO0002 Reserved for concepts with insufficient information to code with codable children: Secondary | ICD-10-CM | POA: Diagnosis not present

## 2014-03-25 DIAGNOSIS — M545 Low back pain, unspecified: Secondary | ICD-10-CM | POA: Diagnosis not present

## 2014-03-25 DIAGNOSIS — G894 Chronic pain syndrome: Secondary | ICD-10-CM | POA: Diagnosis not present

## 2014-03-25 DIAGNOSIS — IMO0002 Reserved for concepts with insufficient information to code with codable children: Secondary | ICD-10-CM | POA: Diagnosis not present

## 2014-03-28 DIAGNOSIS — IMO0002 Reserved for concepts with insufficient information to code with codable children: Secondary | ICD-10-CM | POA: Diagnosis not present

## 2014-03-28 DIAGNOSIS — G894 Chronic pain syndrome: Secondary | ICD-10-CM | POA: Diagnosis not present

## 2014-03-28 DIAGNOSIS — M545 Low back pain, unspecified: Secondary | ICD-10-CM | POA: Diagnosis not present

## 2014-03-31 DIAGNOSIS — R1909 Other intra-abdominal and pelvic swelling, mass and lump: Secondary | ICD-10-CM | POA: Diagnosis not present

## 2014-03-31 DIAGNOSIS — D1330 Benign neoplasm of unspecified part of small intestine: Secondary | ICD-10-CM | POA: Diagnosis not present

## 2014-03-31 DIAGNOSIS — K668 Other specified disorders of peritoneum: Secondary | ICD-10-CM | POA: Diagnosis not present

## 2014-04-08 DIAGNOSIS — D1330 Benign neoplasm of unspecified part of small intestine: Secondary | ICD-10-CM | POA: Diagnosis not present

## 2014-04-15 DIAGNOSIS — M545 Low back pain: Secondary | ICD-10-CM | POA: Diagnosis not present

## 2014-04-17 DIAGNOSIS — D132 Benign neoplasm of duodenum: Secondary | ICD-10-CM | POA: Diagnosis not present

## 2014-04-18 DIAGNOSIS — M545 Low back pain: Secondary | ICD-10-CM | POA: Diagnosis not present

## 2014-04-22 DIAGNOSIS — M545 Low back pain: Secondary | ICD-10-CM | POA: Diagnosis not present

## 2014-04-23 ENCOUNTER — Ambulatory Visit (INDEPENDENT_AMBULATORY_CARE_PROVIDER_SITE_OTHER): Payer: Medicare Other | Admitting: Nurse Practitioner

## 2014-04-23 ENCOUNTER — Encounter: Payer: Self-pay | Admitting: Nurse Practitioner

## 2014-04-23 VITALS — BP 150/80 | HR 63 | Temp 99.1°F | Ht 67.0 in | Wt 160.6 lb

## 2014-04-23 DIAGNOSIS — Z720 Tobacco use: Secondary | ICD-10-CM | POA: Diagnosis not present

## 2014-04-23 DIAGNOSIS — G40309 Generalized idiopathic epilepsy and epileptic syndromes, not intractable, without status epilepticus: Secondary | ICD-10-CM

## 2014-04-23 DIAGNOSIS — R269 Unspecified abnormalities of gait and mobility: Secondary | ICD-10-CM

## 2014-04-23 NOTE — Progress Notes (Signed)
PATIENT: Jeffrey Castillo DOB: 01-28-1951  REASON FOR VISIT: Followup for seizure disorder and gait abnormality  HISTORY FROM: patient  HISTORY OF PRESENT ILLNESS: Jeffrey Castillo, 62 year old Caucasian male returns for followup. He was last seen in this office by Jeffrey Angel, NP on 10/21/13. He has a history of seizure disorder and was on Dilantin for 20 years. In addition he was drinking heavily 1/5 per day of liquor for several years. Recent CT of the cervical spine did not show any cord compression. CT of the brain 04/23/13 with minimal small vessel disease, however it was felt combination of Dilantin and alcohol permanently damaged cerebellum leading to his gait disorder. He is walking with a cane. He denies any falls since last seen. He's also had 3 admissions since last seen for pancreatitis and pancreatic pseudocysts. During one of the admissions, Jeffrey Castillo found a duodenal adenoma during endoscopy. He was referred to [facility] for evaluation and treatment. He is to begin treatments next month, where the adenoma will be slowly removed, with expected periodic procedures over the next year and a half. He has stopped drinking earlier this year. He reports that he has neuropathy in both feet. He has been attending aquatic exercise classes to improve his strength and balance. He has not had seizure activity since 2006. He is currently on Keppra and Lamictal. He returns for reevaluation.   HISTORY: of a gait disorder. The patient is also followed for seizures. The patient has a history of a benign essential tremor as well. The patient was noted to have significant balance issues when he was seen 6 months ago. The patient had been on Dilantin for his seizures on a chronic basis. The patient was taken off of this medication, and he was switched to Keppra. The patient however, has continued to drink a significant amounts of alcohol daily, and his wife indicates that he drinks almost 1/5 of liquor daily. The  patient denies any numbness in the feet with exception of some occasional tingling in the left foot only. The patient has no burning or stinging in the feet. The patient does have tremors affecting all 4 extremities, and some resting tremor of the left arm. The patient has been seen through physical therapy, and his therapist has been concerned that his walking has deteriorated significantly over the last several months. The patient uses a cane for ambulation, and he will occasionally use a walker. The patient returns to this office for an evaluation. The patient has undergone a recent CT scan of the cervical spine, and this did not show spinal cord compression.   REVIEW OF SYSTEMS: Full 14 system review of systems performed and notable only for:    ALLERGIES: Allergies  Allergen Reactions  . Diovan [Valsartan] Other (See Comments)    Lightheaded,dizziness  . Lyrica [Pregabalin] Other (See Comments)    Hallucinations.    . Sulfonamide Derivatives Other (See Comments)    unknown as a child    HOME MEDICATIONS: Outpatient Prescriptions Prior to Visit  Medication Sig Dispense Refill  . bacitracin-polymyxin b (POLYSPORIN) ointment Apply 1 application topically daily as needed.      . cloNIDine (CATAPRES - DOSED IN MG/24 HR) 0.2 mg/24hr patch Place 1 patch onto the skin once a week.      . lamoTRIgine (LAMICTAL) 25 MG tablet Take 2 tablets (50 mg total) by mouth 2 (two) times daily.  120 tablet  5  . levETIRAcetam (KEPPRA) 500 MG tablet Take 1 tablet (500  mg total) by mouth 2 (two) times daily.  60 tablet  5  . lipase/protease/amylase (CREON-12/PANCREASE) 12000 UNITS CPEP capsule Take 3 capsules by mouth 3 (three) times daily before meals.      . naproxen sodium (ANAPROX) 220 MG tablet Take 220 mg by mouth once as needed (pain).      . pantoprazole (PROTONIX) 40 MG tablet Take 40 mg by mouth 2 (two) times daily.       No facility-administered medications prior to visit.    PHYSICAL  EXAM Filed Vitals:   04/23/14 1106  BP: 150/80  Pulse: 63  Temp: 99.1 F (37.3 C)  TempSrc: Oral  Height: 5\' 7"  (1.702 m)  Weight: 160 lb 9.6 oz (72.848 kg)   Body mass index is 25.15 kg/(m^2).  Generalized: Well developed, in no acute distress  Musculoskeletal: No deformity  Skin multiple areas over forearms and hands of bruising   Neurological examination  Mentation: Alert oriented to time, place, history taking. Follows all commands speech and language fluent  Cranial nerve II-XII: Pupils were equal round reactive to light extraocular movements were full, visual field were full on confrontational test. Facial sensation and strength were normal. hearing was intact to finger rubbing bilaterally. Uvula tongue midline. head turning and shoulder shrug were normal and symmetric.Tongue protrusion into cheek strength was normal.  Motor: normal bulk and tone, full strength in the BUE, BLE, No focal weakness  Sensory: normal and symmetric to light touch, pinprick, and vibration  Coordination: finger-nose-finger, heel-to-shin bilaterally, no dysmetria. Resting tremor of the left arm and bilateral intention tremor of both upper extremities finger to nose finger.  Reflexes: Symmetric upper and lower but depressed , plantar responses were flexor bilaterally.  Gait and Station: Rising up from seated position without assistance, wide based stance, moderate stride, good arm swing, smooth turning, able to perform tiptoe, and heel walking with mild difficulty. Tandem gait is mildly unsteady. Using a cane for assistance.  DIAGNOSTIC DATA (LABS, IMAGING, TESTING) - I reviewed patient records, labs, notes, testing and imaging myself where available.  Lab Results  Component Value Date   WBC 8.5 09/06/2013   HGB 12.5* 09/06/2013   HCT 36.4* 09/06/2013   MCV 94.5 09/06/2013   PLT 194 09/06/2013      Component Value Date/Time   NA 141 10/15/2013 1330   K 4.2 10/15/2013 1330   CL 103 10/15/2013 1330   CO2 27  10/15/2013 1330   GLUCOSE 85 10/15/2013 1330   BUN 8 10/15/2013 1330   CREATININE 0.75 10/15/2013 1330   CALCIUM 9.8 10/15/2013 1330   PROT 6.5 10/15/2013 1330   ALBUMIN 3.5 10/15/2013 1330   AST 14 10/15/2013 1330   ALT 7 10/15/2013 1330   ALKPHOS 79 10/15/2013 1330   BILITOT 0.2* 10/15/2013 1330   GFRNONAA >90 10/15/2013 1330   GFRAA >90 10/15/2013 1330   CT of the head 04/23/13 showing mild changes of chronic microvascular ischemia and generalized cerebral atrophy no acute abnormalities are noted   ASSESSMENT: 63 y.o. 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    PLAN: Continue Lamictal and Keppra at current doses, does not need refills. Continue aquatic exercise to improve strength and  balance. Call the office if you have any seizure activity. Followup in 1 year with Dr. Anne Hahn, sooner as needed.  Tawny Asal Edithe Dobbin, MSN, FNP-BC, A/GNP-C 04/23/2014, 11:11 AM [facility] [address], [address] [place], [place] [address] [phone]  Note: This document was prepared with digital dictation and possible smart phrase technology. Any transcriptional errors that result from this process are unintentional.

## 2014-04-23 NOTE — Progress Notes (Signed)
I have read the note, and I agree with the clinical assessment and plan.  Jeffrey Castillo,Jeffrey Castillo   

## 2014-04-23 NOTE — Patient Instructions (Signed)
PLAN: Continue Lamictal and Keppra at current doses, does not need refills. Call the office if you have any seizure activity. Please try to quit smoking. Followup in 6 months with Dr. Jannifer Franklin, sooner as needed.

## 2014-04-25 ENCOUNTER — Other Ambulatory Visit: Payer: Self-pay

## 2014-04-25 DIAGNOSIS — M545 Low back pain: Secondary | ICD-10-CM | POA: Diagnosis not present

## 2014-04-29 DIAGNOSIS — M545 Low back pain: Secondary | ICD-10-CM | POA: Diagnosis not present

## 2014-05-02 DIAGNOSIS — K7030 Alcoholic cirrhosis of liver without ascites: Secondary | ICD-10-CM | POA: Diagnosis not present

## 2014-05-02 DIAGNOSIS — D132 Benign neoplasm of duodenum: Secondary | ICD-10-CM | POA: Diagnosis not present

## 2014-05-02 DIAGNOSIS — K859 Acute pancreatitis, unspecified: Secondary | ICD-10-CM | POA: Diagnosis not present

## 2014-05-02 DIAGNOSIS — Z86010 Personal history of colonic polyps: Secondary | ICD-10-CM | POA: Diagnosis not present

## 2014-05-02 DIAGNOSIS — I10 Essential (primary) hypertension: Secondary | ICD-10-CM | POA: Diagnosis not present

## 2014-05-02 DIAGNOSIS — F17210 Nicotine dependence, cigarettes, uncomplicated: Secondary | ICD-10-CM | POA: Diagnosis not present

## 2014-05-06 DIAGNOSIS — M545 Low back pain: Secondary | ICD-10-CM | POA: Diagnosis not present

## 2014-05-09 DIAGNOSIS — M545 Low back pain: Secondary | ICD-10-CM | POA: Diagnosis not present

## 2014-05-28 DIAGNOSIS — Z658 Other specified problems related to psychosocial circumstances: Secondary | ICD-10-CM | POA: Diagnosis not present

## 2014-05-28 DIAGNOSIS — E559 Vitamin D deficiency, unspecified: Secondary | ICD-10-CM | POA: Diagnosis not present

## 2014-05-28 DIAGNOSIS — Z86010 Personal history of colonic polyps: Secondary | ICD-10-CM | POA: Diagnosis not present

## 2014-05-28 DIAGNOSIS — K265 Chronic or unspecified duodenal ulcer with perforation: Secondary | ICD-10-CM | POA: Diagnosis not present

## 2014-05-28 DIAGNOSIS — R569 Unspecified convulsions: Secondary | ICD-10-CM | POA: Diagnosis not present

## 2014-05-28 DIAGNOSIS — E538 Deficiency of other specified B group vitamins: Secondary | ICD-10-CM | POA: Diagnosis not present

## 2014-05-28 DIAGNOSIS — M810 Age-related osteoporosis without current pathological fracture: Secondary | ICD-10-CM | POA: Diagnosis not present

## 2014-05-28 DIAGNOSIS — I700 Atherosclerosis of aorta: Secondary | ICD-10-CM | POA: Diagnosis not present

## 2014-05-28 DIAGNOSIS — Z23 Encounter for immunization: Secondary | ICD-10-CM | POA: Diagnosis not present

## 2014-05-28 DIAGNOSIS — F17210 Nicotine dependence, cigarettes, uncomplicated: Secondary | ICD-10-CM | POA: Diagnosis not present

## 2014-07-15 DIAGNOSIS — D132 Benign neoplasm of duodenum: Secondary | ICD-10-CM | POA: Diagnosis not present

## 2014-07-22 DIAGNOSIS — L821 Other seborrheic keratosis: Secondary | ICD-10-CM | POA: Diagnosis not present

## 2014-07-22 DIAGNOSIS — L814 Other melanin hyperpigmentation: Secondary | ICD-10-CM | POA: Diagnosis not present

## 2014-07-22 DIAGNOSIS — D485 Neoplasm of uncertain behavior of skin: Secondary | ICD-10-CM | POA: Diagnosis not present

## 2014-07-22 DIAGNOSIS — D225 Melanocytic nevi of trunk: Secondary | ICD-10-CM | POA: Diagnosis not present

## 2014-07-22 DIAGNOSIS — L570 Actinic keratosis: Secondary | ICD-10-CM | POA: Diagnosis not present

## 2014-08-26 ENCOUNTER — Other Ambulatory Visit: Payer: Self-pay

## 2014-08-26 MED ORDER — LAMOTRIGINE 25 MG PO TABS: 50.0000 mg | ORAL_TABLET | Freq: Two times a day (BID) | ORAL | Status: DC

## 2014-08-26 MED ORDER — LEVETIRACETAM 500 MG PO TABS
500.0000 mg | ORAL_TABLET | Freq: Two times a day (BID) | ORAL | Status: DC
Start: 2014-08-26 — End: 2015-01-01

## 2014-09-02 DIAGNOSIS — M810 Age-related osteoporosis without current pathological fracture: Secondary | ICD-10-CM | POA: Diagnosis not present

## 2014-09-02 DIAGNOSIS — E559 Vitamin D deficiency, unspecified: Secondary | ICD-10-CM | POA: Diagnosis not present

## 2014-09-02 DIAGNOSIS — F17210 Nicotine dependence, cigarettes, uncomplicated: Secondary | ICD-10-CM | POA: Diagnosis not present

## 2014-09-02 DIAGNOSIS — M545 Low back pain: Secondary | ICD-10-CM | POA: Diagnosis not present

## 2014-09-02 DIAGNOSIS — R569 Unspecified convulsions: Secondary | ICD-10-CM | POA: Diagnosis not present

## 2014-09-02 DIAGNOSIS — Z86010 Personal history of colonic polyps: Secondary | ICD-10-CM | POA: Diagnosis not present

## 2014-09-02 DIAGNOSIS — Z87898 Personal history of other specified conditions: Secondary | ICD-10-CM | POA: Diagnosis not present

## 2014-09-02 DIAGNOSIS — E538 Deficiency of other specified B group vitamins: Secondary | ICD-10-CM | POA: Diagnosis not present

## 2014-10-01 ENCOUNTER — Other Ambulatory Visit: Payer: Self-pay | Admitting: Physical Medicine and Rehabilitation

## 2014-10-01 DIAGNOSIS — M545 Low back pain, unspecified: Secondary | ICD-10-CM

## 2014-10-01 DIAGNOSIS — G90522 Complex regional pain syndrome I of left lower limb: Secondary | ICD-10-CM | POA: Diagnosis not present

## 2014-10-01 DIAGNOSIS — M5417 Radiculopathy, lumbosacral region: Secondary | ICD-10-CM | POA: Diagnosis not present

## 2014-10-01 DIAGNOSIS — G894 Chronic pain syndrome: Secondary | ICD-10-CM | POA: Diagnosis not present

## 2014-10-01 DIAGNOSIS — M961 Postlaminectomy syndrome, not elsewhere classified: Secondary | ICD-10-CM | POA: Diagnosis not present

## 2014-10-01 DIAGNOSIS — R260 Ataxic gait: Secondary | ICD-10-CM | POA: Diagnosis not present

## 2014-10-03 ENCOUNTER — Ambulatory Visit
Admission: RE | Admit: 2014-10-03 | Discharge: 2014-10-03 | Disposition: A | Discharge status: Home / Self Care | Payer: Medicare Other | Source: Ambulatory Visit | Attending: Physical Medicine and Rehabilitation | Admitting: Physical Medicine and Rehabilitation

## 2014-10-03 DIAGNOSIS — M545 Low back pain, unspecified: Secondary | ICD-10-CM

## 2014-10-03 DIAGNOSIS — M5127 Other intervertebral disc displacement, lumbosacral region: Secondary | ICD-10-CM | POA: Diagnosis not present

## 2014-10-03 DIAGNOSIS — M4806 Spinal stenosis, lumbar region: Secondary | ICD-10-CM | POA: Diagnosis not present

## 2014-10-03 DIAGNOSIS — M5137 Other intervertebral disc degeneration, lumbosacral region: Secondary | ICD-10-CM | POA: Diagnosis not present

## 2014-10-08 DIAGNOSIS — G90522 Complex regional pain syndrome I of left lower limb: Secondary | ICD-10-CM | POA: Diagnosis not present

## 2014-10-08 DIAGNOSIS — M5137 Other intervertebral disc degeneration, lumbosacral region: Secondary | ICD-10-CM | POA: Diagnosis not present

## 2014-10-08 DIAGNOSIS — R2681 Unsteadiness on feet: Secondary | ICD-10-CM | POA: Diagnosis not present

## 2014-10-08 DIAGNOSIS — G894 Chronic pain syndrome: Secondary | ICD-10-CM | POA: Diagnosis not present

## 2014-10-08 DIAGNOSIS — M961 Postlaminectomy syndrome, not elsewhere classified: Secondary | ICD-10-CM | POA: Diagnosis not present

## 2014-10-08 DIAGNOSIS — M545 Low back pain: Secondary | ICD-10-CM | POA: Diagnosis not present

## 2014-10-08 DIAGNOSIS — M5117 Intervertebral disc disorders with radiculopathy, lumbosacral region: Secondary | ICD-10-CM | POA: Diagnosis not present

## 2014-10-09 DIAGNOSIS — M5417 Radiculopathy, lumbosacral region: Secondary | ICD-10-CM | POA: Diagnosis not present

## 2014-10-09 DIAGNOSIS — M5137 Other intervertebral disc degeneration, lumbosacral region: Secondary | ICD-10-CM | POA: Diagnosis not present

## 2014-10-09 DIAGNOSIS — M961 Postlaminectomy syndrome, not elsewhere classified: Secondary | ICD-10-CM | POA: Diagnosis not present

## 2014-10-09 DIAGNOSIS — M4806 Spinal stenosis, lumbar region: Secondary | ICD-10-CM | POA: Diagnosis not present

## 2014-10-09 DIAGNOSIS — M545 Low back pain: Secondary | ICD-10-CM | POA: Diagnosis not present

## 2014-10-09 DIAGNOSIS — R2689 Other abnormalities of gait and mobility: Secondary | ICD-10-CM | POA: Diagnosis not present

## 2014-10-14 DIAGNOSIS — D132 Benign neoplasm of duodenum: Secondary | ICD-10-CM | POA: Diagnosis not present

## 2014-11-27 DIAGNOSIS — G90522 Complex regional pain syndrome I of left lower limb: Secondary | ICD-10-CM | POA: Diagnosis not present

## 2014-11-27 DIAGNOSIS — M5117 Intervertebral disc disorders with radiculopathy, lumbosacral region: Secondary | ICD-10-CM | POA: Diagnosis not present

## 2014-11-27 DIAGNOSIS — R2689 Other abnormalities of gait and mobility: Secondary | ICD-10-CM | POA: Diagnosis not present

## 2014-11-27 DIAGNOSIS — M961 Postlaminectomy syndrome, not elsewhere classified: Secondary | ICD-10-CM | POA: Diagnosis not present

## 2014-11-27 DIAGNOSIS — G894 Chronic pain syndrome: Secondary | ICD-10-CM | POA: Diagnosis not present

## 2014-11-27 DIAGNOSIS — M4806 Spinal stenosis, lumbar region: Secondary | ICD-10-CM | POA: Diagnosis not present

## 2014-11-27 DIAGNOSIS — M5137 Other intervertebral disc degeneration, lumbosacral region: Secondary | ICD-10-CM | POA: Diagnosis not present

## 2014-12-01 DIAGNOSIS — M5137 Other intervertebral disc degeneration, lumbosacral region: Secondary | ICD-10-CM | POA: Diagnosis not present

## 2014-12-01 DIAGNOSIS — M961 Postlaminectomy syndrome, not elsewhere classified: Secondary | ICD-10-CM | POA: Diagnosis not present

## 2014-12-01 DIAGNOSIS — G894 Chronic pain syndrome: Secondary | ICD-10-CM | POA: Diagnosis not present

## 2014-12-01 DIAGNOSIS — M5117 Intervertebral disc disorders with radiculopathy, lumbosacral region: Secondary | ICD-10-CM | POA: Diagnosis not present

## 2014-12-01 DIAGNOSIS — R2689 Other abnormalities of gait and mobility: Secondary | ICD-10-CM | POA: Diagnosis not present

## 2014-12-01 DIAGNOSIS — M4806 Spinal stenosis, lumbar region: Secondary | ICD-10-CM | POA: Diagnosis not present

## 2015-01-01 ENCOUNTER — Other Ambulatory Visit: Payer: Self-pay

## 2015-01-01 MED ORDER — LEVETIRACETAM 500 MG PO TABS: 500.0000 mg | ORAL_TABLET | Freq: Two times a day (BID) | ORAL | Status: DC

## 2015-01-05 ENCOUNTER — Other Ambulatory Visit: Payer: Self-pay

## 2015-01-08 IMAGING — RF DG UGI W/ GASTROGRAFIN
15 of 16 series · 15 of 16 positions shown · IV contrast (omnipaque)
Comparison: CT scan 07/17/2013.

FLUOROSCOPY TIME:  4 min

CLINICAL DATA: Possible perforated duodenal ulcer, evaluate for
leak

EXAM:
WATER SOLUBLE UPPER GI SERIES
TECHNIQUE: Single-column upper GI series was performed using water soluble
contrast.
CONTRAST:  , 150mL OMNIPAQUE IOHEXOL 300 MG/ML  SOLN

[Series 1: run · 1 of 1 slices shown (1 of 13)]
[im 1/1]
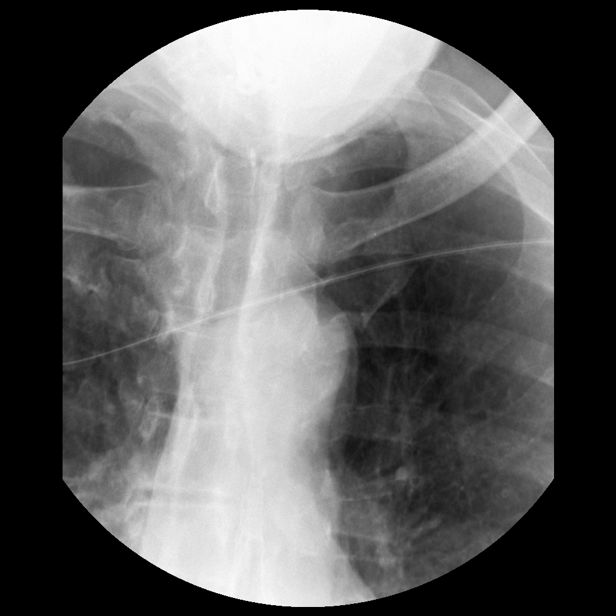

[Series 2: run · 1 of 1 slices shown (2 of 13)]
[im 1/1]
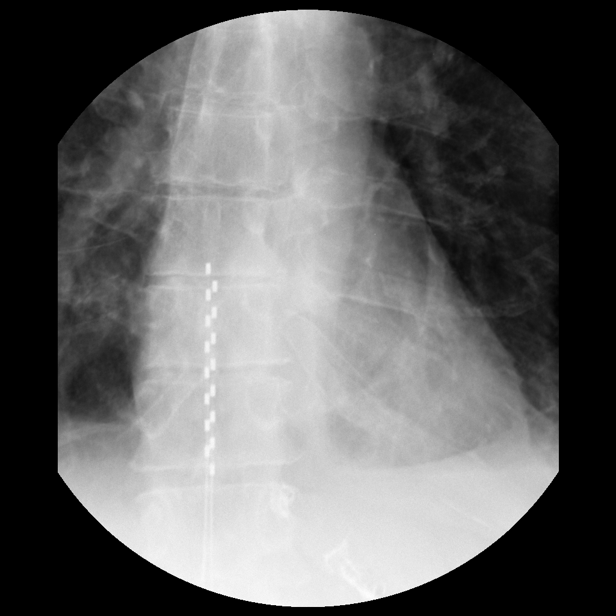

[Series 3: run · 1 of 1 slices shown (3 of 13)]
[im 1/1]
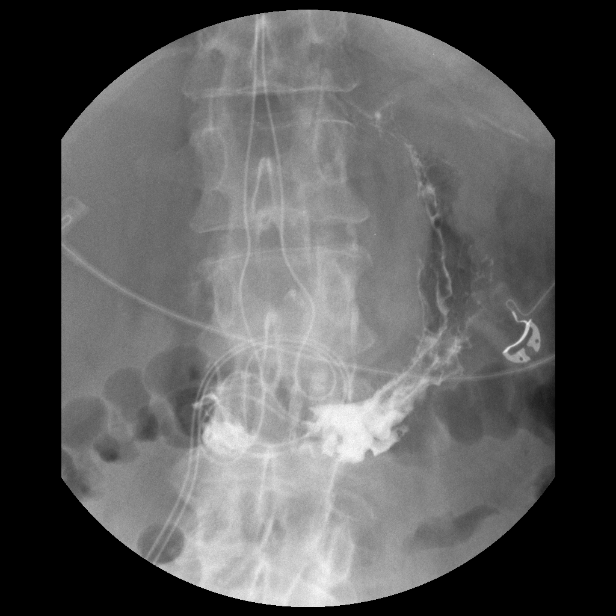

[Series 4: run · 1 of 1 slices shown (4 of 13)]
[im 1/1]
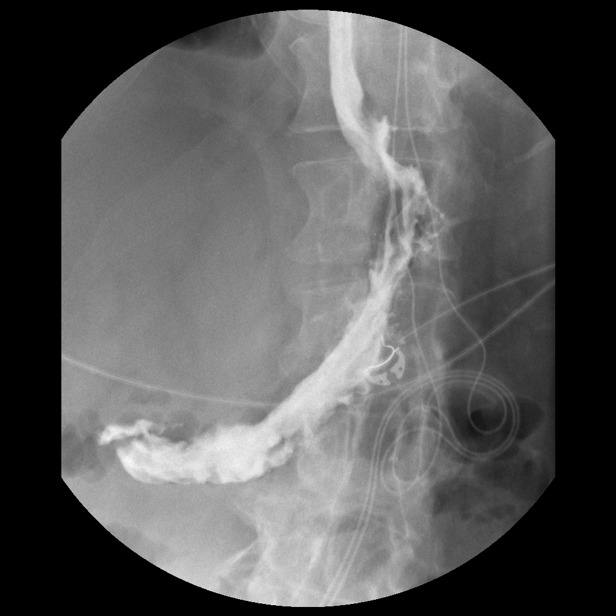

[Series 5: run · 1 of 1 slices shown (5 of 13)]
[im 1/1]
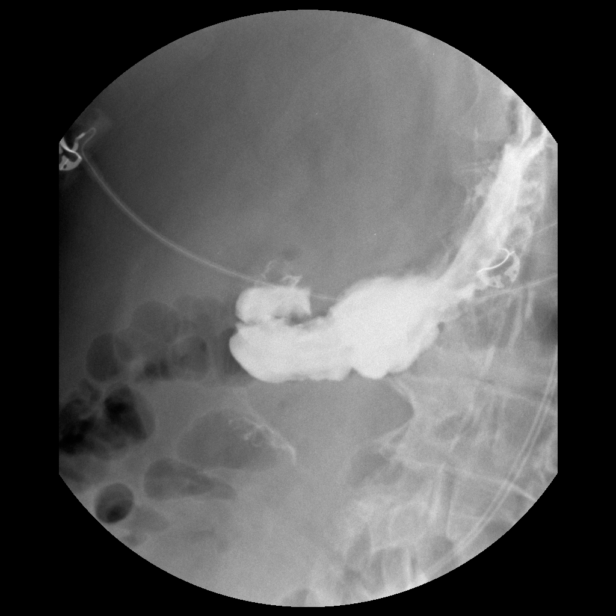

[Series 6: run · 1 of 1 slices shown (6 of 13)]
[im 1/1]
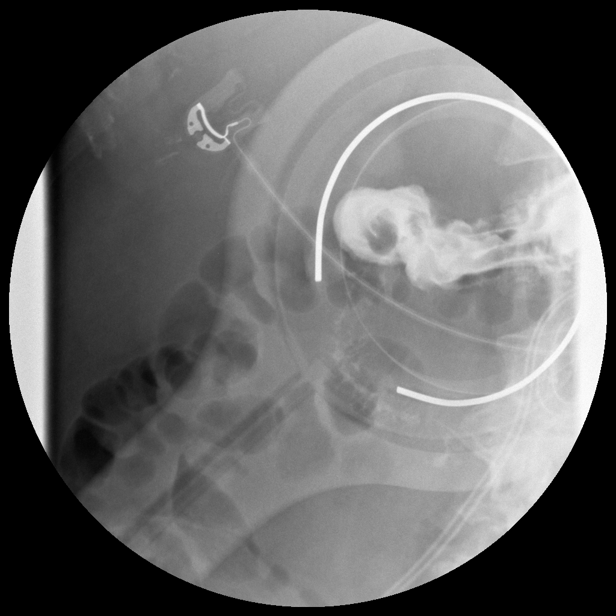

[Series 7: run · 1 of 1 slices shown (7 of 13)]
[im 1/1]
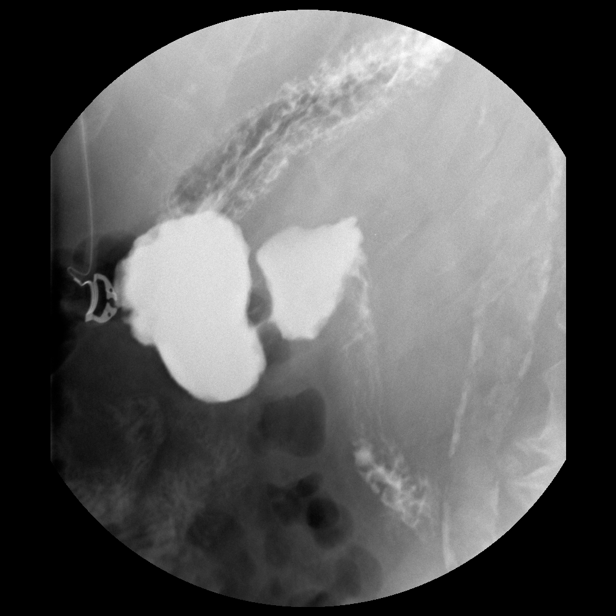

[Series 9: run · 1 of 1 slices shown (8 of 13)]
[im 1/1]
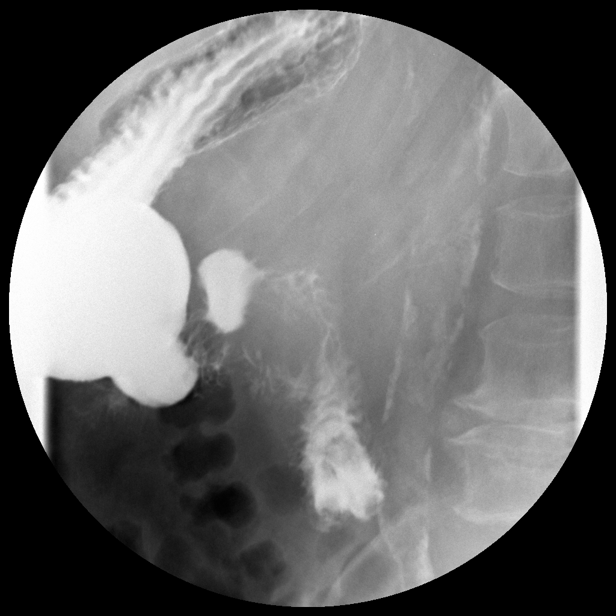

[Series 10: run · 1 of 1 slices shown (9 of 13)]
[im 1/1]
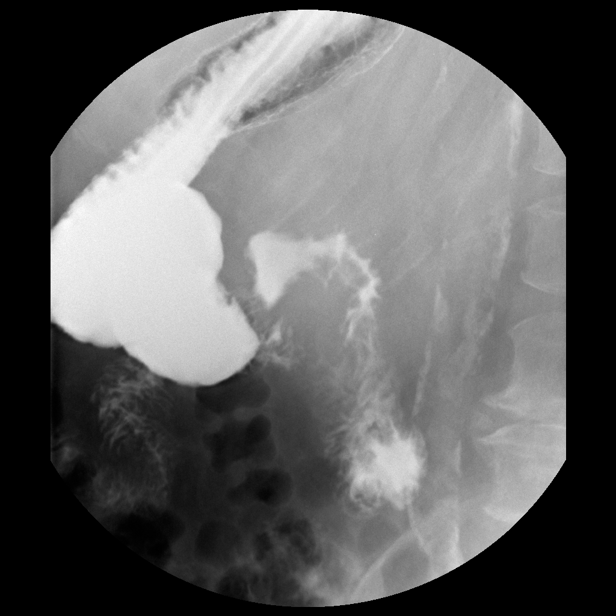

[Series 11: run · 1 of 1 slices shown (10 of 13)]
[im 1/1]
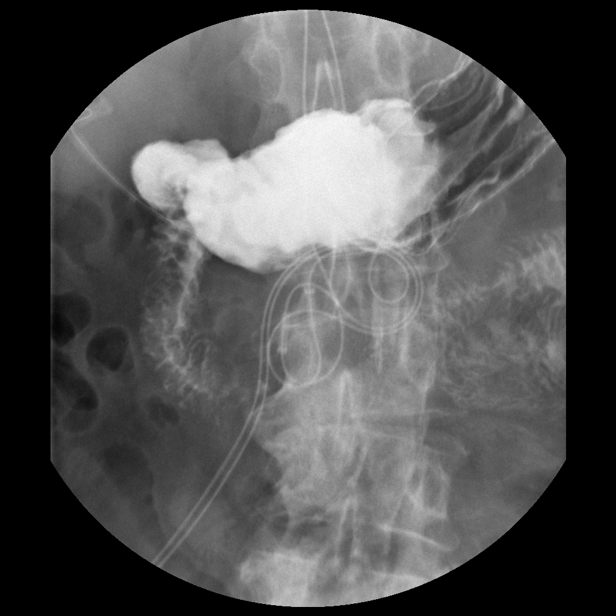

[Series 12: run · 1 of 1 slices shown (11 of 13)]
[im 1/1]
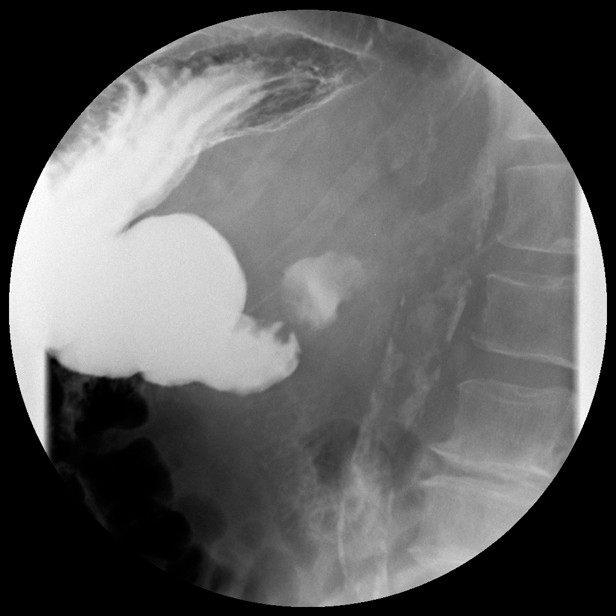

[Series 13: run · 1 of 1 slices shown (12 of 13)]
[im 1/1]
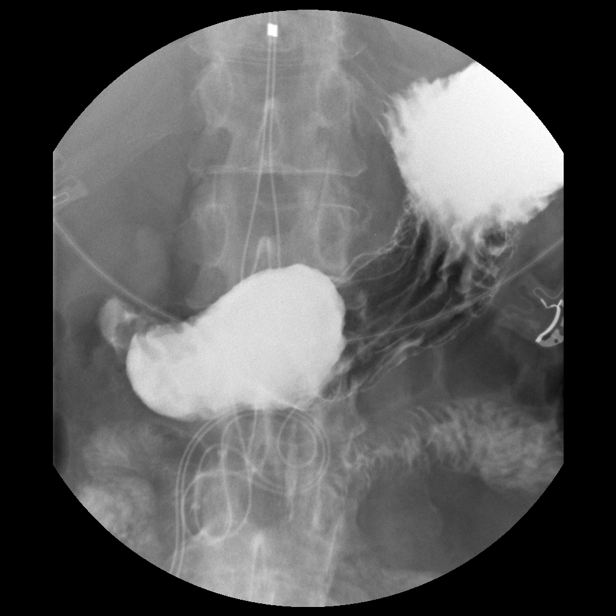

[Series 14: run · 1 of 1 slices shown (13 of 13)]
[im 1/1]
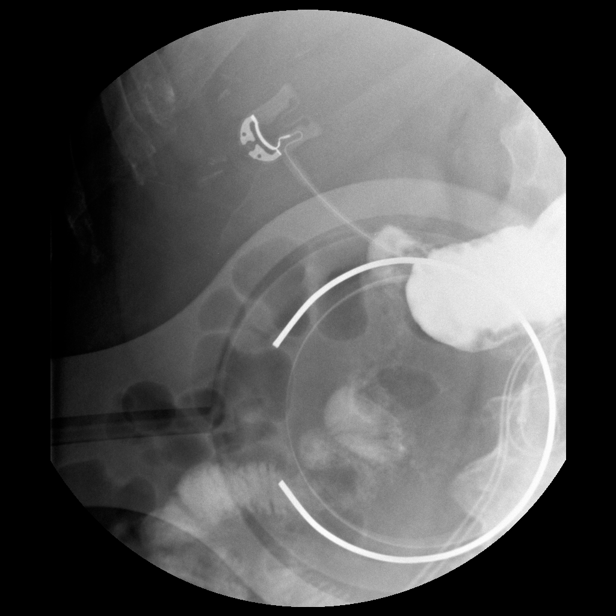

[Series 1001: view not recorded · 0.20mm/px · 1 of 1 slices shown (1 of 2)]
[im 1/1]
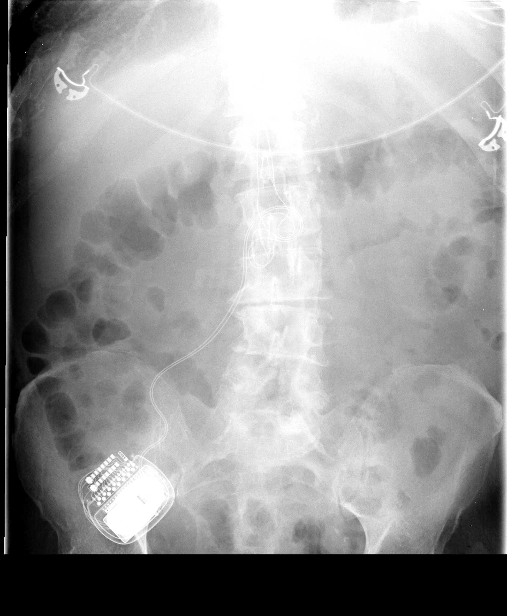

[Series 1002: view not recorded · 0.20mm/px · 1 of 1 slices shown (2 of 2)]
[im 1/1]
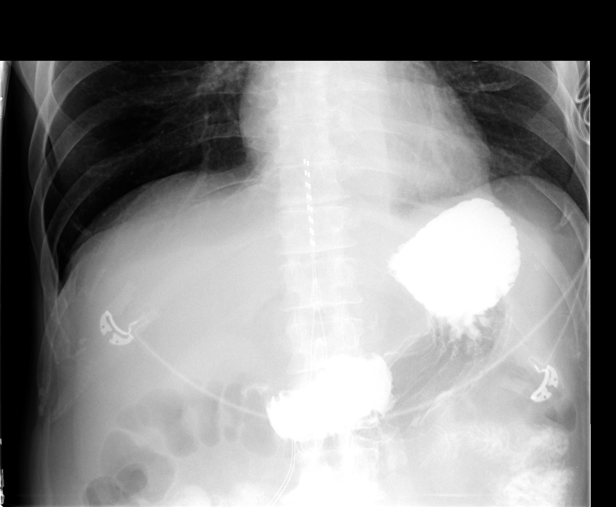

[15 of 16 positions shown; findings below may reference images not displayed]

FINDINGS: The visualized esophagus shows normal contour and distensibility.
Normal motility.

The stomach shows normal contour and distensibility. Mild decreased
gastric motility.

Duodenal bulb and duodenal sweep is unremarkable. No evidence of
gastric outlet obstruction. Contrast is noted advancing in duodenum
and proximal jejunum. There is no evidence of contrast
extravasation. Static image post fluoroscopy demonstrate persistent
residual contrast material within stomach.
IMPRESSION: 1. Unremarkable visualized esophagus. No evidence of gastric outlet
obstruction. Mild decrease gastric motility with some delay in
gastric emptying. No evidence of gastric outlet obstruction.
Unremarkable duodenal bulb and duodenal sweep. No evidence of
contrast extravasation. Visualized proximal small bowel is
unremarkable.

## 2015-01-23 DIAGNOSIS — D132 Benign neoplasm of duodenum: Secondary | ICD-10-CM | POA: Diagnosis not present

## 2015-01-23 DIAGNOSIS — F17210 Nicotine dependence, cigarettes, uncomplicated: Secondary | ICD-10-CM | POA: Diagnosis not present

## 2015-01-23 DIAGNOSIS — I10 Essential (primary) hypertension: Secondary | ICD-10-CM | POA: Diagnosis not present

## 2015-01-23 DIAGNOSIS — K859 Acute pancreatitis, unspecified: Secondary | ICD-10-CM | POA: Diagnosis not present

## 2015-01-23 DIAGNOSIS — K3184 Gastroparesis: Secondary | ICD-10-CM | POA: Diagnosis not present

## 2015-01-23 DIAGNOSIS — K766 Portal hypertension: Secondary | ICD-10-CM | POA: Diagnosis not present

## 2015-01-23 DIAGNOSIS — Z86010 Personal history of colonic polyps: Secondary | ICD-10-CM | POA: Diagnosis not present

## 2015-01-23 DIAGNOSIS — J449 Chronic obstructive pulmonary disease, unspecified: Secondary | ICD-10-CM | POA: Diagnosis not present

## 2015-01-23 DIAGNOSIS — K7030 Alcoholic cirrhosis of liver without ascites: Secondary | ICD-10-CM | POA: Diagnosis not present

## 2015-02-10 DIAGNOSIS — R2689 Other abnormalities of gait and mobility: Secondary | ICD-10-CM | POA: Diagnosis not present

## 2015-02-10 DIAGNOSIS — G90522 Complex regional pain syndrome I of left lower limb: Secondary | ICD-10-CM | POA: Diagnosis not present

## 2015-02-10 DIAGNOSIS — G894 Chronic pain syndrome: Secondary | ICD-10-CM | POA: Diagnosis not present

## 2015-02-10 DIAGNOSIS — M4806 Spinal stenosis, lumbar region: Secondary | ICD-10-CM | POA: Diagnosis not present

## 2015-02-10 DIAGNOSIS — M5117 Intervertebral disc disorders with radiculopathy, lumbosacral region: Secondary | ICD-10-CM | POA: Diagnosis not present

## 2015-02-10 DIAGNOSIS — M5137 Other intervertebral disc degeneration, lumbosacral region: Secondary | ICD-10-CM | POA: Diagnosis not present

## 2015-02-23 DIAGNOSIS — E538 Deficiency of other specified B group vitamins: Secondary | ICD-10-CM | POA: Diagnosis not present

## 2015-02-23 DIAGNOSIS — R569 Unspecified convulsions: Secondary | ICD-10-CM | POA: Diagnosis not present

## 2015-02-23 DIAGNOSIS — K265 Chronic or unspecified duodenal ulcer with perforation: Secondary | ICD-10-CM | POA: Diagnosis not present

## 2015-02-23 DIAGNOSIS — Z79899 Other long term (current) drug therapy: Secondary | ICD-10-CM | POA: Diagnosis not present

## 2015-02-23 DIAGNOSIS — M810 Age-related osteoporosis without current pathological fracture: Secondary | ICD-10-CM | POA: Diagnosis not present

## 2015-02-23 DIAGNOSIS — I700 Atherosclerosis of aorta: Secondary | ICD-10-CM | POA: Diagnosis not present

## 2015-02-23 DIAGNOSIS — Z0000 Encounter for general adult medical examination without abnormal findings: Secondary | ICD-10-CM | POA: Diagnosis not present

## 2015-02-23 DIAGNOSIS — Z23 Encounter for immunization: Secondary | ICD-10-CM | POA: Diagnosis not present

## 2015-02-23 DIAGNOSIS — F17210 Nicotine dependence, cigarettes, uncomplicated: Secondary | ICD-10-CM | POA: Diagnosis not present

## 2015-02-23 DIAGNOSIS — Z86010 Personal history of colonic polyps: Secondary | ICD-10-CM | POA: Diagnosis not present

## 2015-02-23 DIAGNOSIS — E559 Vitamin D deficiency, unspecified: Secondary | ICD-10-CM | POA: Diagnosis not present

## 2015-03-12 DIAGNOSIS — H903 Sensorineural hearing loss, bilateral: Secondary | ICD-10-CM | POA: Diagnosis not present

## 2015-03-17 ENCOUNTER — Other Ambulatory Visit: Payer: Self-pay | Admitting: Acute Care

## 2015-03-17 DIAGNOSIS — M25572 Pain in left ankle and joints of left foot: Secondary | ICD-10-CM | POA: Diagnosis not present

## 2015-03-17 DIAGNOSIS — F17210 Nicotine dependence, cigarettes, uncomplicated: Secondary | ICD-10-CM

## 2015-03-17 DIAGNOSIS — S82402A Unspecified fracture of shaft of left fibula, initial encounter for closed fracture: Secondary | ICD-10-CM | POA: Diagnosis not present

## 2015-03-19 DIAGNOSIS — M25572 Pain in left ankle and joints of left foot: Secondary | ICD-10-CM | POA: Diagnosis not present

## 2015-03-23 DIAGNOSIS — Y929 Unspecified place or not applicable: Secondary | ICD-10-CM | POA: Diagnosis not present

## 2015-03-23 DIAGNOSIS — S8362XA Sprain of the superior tibiofibular joint and ligament, left knee, initial encounter: Secondary | ICD-10-CM | POA: Diagnosis not present

## 2015-03-23 DIAGNOSIS — Y999 Unspecified external cause status: Secondary | ICD-10-CM | POA: Diagnosis not present

## 2015-03-23 DIAGNOSIS — S8262XA Displaced fracture of lateral malleolus of left fibula, initial encounter for closed fracture: Secondary | ICD-10-CM | POA: Diagnosis not present

## 2015-03-27 ENCOUNTER — Telehealth: Payer: Self-pay | Admitting: Acute Care

## 2015-03-27 NOTE — Telephone Encounter (Signed)
Jeffrey Castillo has broken his leg and has an issue with mobility. We have changed his appointment form 03/31/15 to 04/28/15 to allow for him to recover from his broken leg.He verbalized understanding about time and location of the new appointment . He has my contact information in the event he needs to make any additional changes.

## 2015-03-31 ENCOUNTER — Encounter: Payer: Medicare Other | Admitting: Acute Care

## 2015-04-02 ENCOUNTER — Other Ambulatory Visit: Payer: Self-pay

## 2015-04-02 MED ORDER — LAMOTRIGINE 25 MG PO TABS: 50.0000 mg | ORAL_TABLET | Freq: Two times a day (BID) | ORAL | Status: DC

## 2015-04-03 DIAGNOSIS — S8262XD Displaced fracture of lateral malleolus of left fibula, subsequent encounter for closed fracture with routine healing: Secondary | ICD-10-CM | POA: Diagnosis not present

## 2015-04-14 DIAGNOSIS — M5137 Other intervertebral disc degeneration, lumbosacral region: Secondary | ICD-10-CM | POA: Diagnosis not present

## 2015-04-14 DIAGNOSIS — G894 Chronic pain syndrome: Secondary | ICD-10-CM | POA: Diagnosis not present

## 2015-04-14 DIAGNOSIS — M5417 Radiculopathy, lumbosacral region: Secondary | ICD-10-CM | POA: Diagnosis not present

## 2015-04-14 DIAGNOSIS — M545 Low back pain: Secondary | ICD-10-CM | POA: Diagnosis not present

## 2015-04-14 DIAGNOSIS — M5117 Intervertebral disc disorders with radiculopathy, lumbosacral region: Secondary | ICD-10-CM | POA: Diagnosis not present

## 2015-04-14 DIAGNOSIS — M4806 Spinal stenosis, lumbar region: Secondary | ICD-10-CM | POA: Diagnosis not present

## 2015-04-14 DIAGNOSIS — G90522 Complex regional pain syndrome I of left lower limb: Secondary | ICD-10-CM | POA: Diagnosis not present

## 2015-04-14 DIAGNOSIS — M961 Postlaminectomy syndrome, not elsewhere classified: Secondary | ICD-10-CM | POA: Diagnosis not present

## 2015-04-20 DIAGNOSIS — S8262XD Displaced fracture of lateral malleolus of left fibula, subsequent encounter for closed fracture with routine healing: Secondary | ICD-10-CM | POA: Diagnosis not present

## 2015-04-22 DIAGNOSIS — T148 Other injury of unspecified body region: Secondary | ICD-10-CM | POA: Diagnosis not present

## 2015-04-24 ENCOUNTER — Telehealth: Payer: Self-pay | Admitting: Neurology

## 2015-04-24 ENCOUNTER — Ambulatory Visit: Payer: Medicare Other | Admitting: Neurology

## 2015-04-24 NOTE — Telephone Encounter (Signed)
This patient did not show for a revisit appointment today. 

## 2015-04-28 ENCOUNTER — Telehealth: Payer: Self-pay | Admitting: Acute Care

## 2015-04-28 ENCOUNTER — Ambulatory Visit (INDEPENDENT_AMBULATORY_CARE_PROVIDER_SITE_OTHER): Payer: Medicare Other | Admitting: Acute Care

## 2015-04-28 ENCOUNTER — Ambulatory Visit (INDEPENDENT_AMBULATORY_CARE_PROVIDER_SITE_OTHER)
Admission: RE | Admit: 2015-04-28 | Discharge: 2015-04-28 | Disposition: A | Discharge status: Home / Self Care | Payer: Medicare Other | Source: Ambulatory Visit | Attending: Acute Care | Admitting: Acute Care

## 2015-04-28 ENCOUNTER — Encounter: Payer: Self-pay | Admitting: Acute Care

## 2015-04-28 DIAGNOSIS — F17210 Nicotine dependence, cigarettes, uncomplicated: Secondary | ICD-10-CM

## 2015-04-28 NOTE — Telephone Encounter (Signed)
I called the scan results of Mr. Jeffrey Castillo. I explained that his scan was read as a Lung RADs 2, nodules with a very low likelihood of becoming a clinically active cancer. The recommendation was for annual screening in 12 months I told him we will call him at the end of Sept. 2017 to schedule. He verbalized understanding and had no further questions.

## 2015-04-28 NOTE — Progress Notes (Signed)
Shared Decision Making Visit Lung Cancer Screening Program [phone])   Eligibility:  Age 64 y.o.  Pack Years Smoking History Calculation 50 pack year smoker per patient interview (# packs/per year x # years smoked)  Recent History of coughing up blood  no  Unexplained weight loss? no ( >Than 15 pounds within the last 6 months )  Prior History Lung / other cancer no (Diagnosis within the last 5 years already requiring surveillance chest CT Scans).  Smoking Status Current Smoker  Former Smokers: Years since quit:NA  Quit Date: NA  Visit Components:  Discussion included one or more decision making aids. yes  Discussion included risk/benefits of screening. yes  Discussion included potential follow up diagnostic testing for abnormal scans. yes  Discussion included meaning and risk of over diagnosis. yes  Discussion included meaning and risk of False Positives. yes  Discussion included meaning of total radiation exposure. yes  Counseling Included:  Importance of adherence to annual lung cancer LDCT screening. yes  Impact of comorbidities on ability to participate in the program. yes  Ability and willingness to under diagnostic treatment. yes  Smoking Cessation Counseling:  Current Smokers:   Discussed importance of smoking cessation. yes  Information about tobacco cessation classes and interventions provided to patient. yes  Patient provided with "ticket" for LDCT Scan. yes  Symptomatic Patient. no  Counseling:NA  Diagnosis Code: Tobacco Use Z72.0  Asymptomatic Patient yes  Counseling (Intermediate counseling: > three minutes counseling) S8270  Former Smokers:   Discussed the importance of maintaining cigarette abstinence.NA  Diagnosis Code: Personal History of Nicotine Dependence. B86.754  Information about tobacco cessation classes and interventions provided to patient.See above  Patient provided with "ticket" for LDCT Scan. yes  Written Order for  Lung Cancer Screening with LDCT placed in Epic. Yes (CT Chest Lung Cancer Screening Low Dose W/O CM) GBE0100 Z12.2-Screening of respiratory organs Z87.891-Personal history of nicotine dependence  I spent 15 minutes of face to face time with Mr. Nave discussing the risks and benefits of lung cancer screening. We viewed a power point together that discussed the above noted topics, pausing at intervals to allow for questions to be asked and answered to ensure understanding. We discussed that the single most powerful action that he can take to decrease his risk of lung cancer is to quit smoking. He is not ready to set a quit date, but is contemplating asking his wife, who also smokes, to try to quit together. We discussed nicotine replacement therapy, medication aids, and behavior modification. We also discussed setting small goals, and smoking 1 cigarette less per day for 2 weeks and then 2 cigarettes less per day etc. I gave him the " Be stronger than your excuses" card, with the QUIT SMART number for free nicotine replacement therapy, and the [place] number for smoking cessation classes. Mr. Lacuesta wants to see if his wife will go to the classes with him. He thinks it may help them both. I have given him my card and contact information and told him to call me if there is anything I can do to help him become smoke free once he is ready to set a quit date. I have also given him a copy of the power point we reviewed together as a resource to refer to in the future. We discussed the time and location of his scan and that I will call within 24-48 hours of the scan  with the results. He has verbalized understanding of all of the  above, and had no further questions upon leaving the office.  Magdalen Spatz, NP

## 2015-04-29 ENCOUNTER — Encounter: Payer: Self-pay | Admitting: Neurology

## 2015-04-29 DIAGNOSIS — S8262XD Displaced fracture of lateral malleolus of left fibula, subsequent encounter for closed fracture with routine healing: Secondary | ICD-10-CM | POA: Diagnosis not present

## 2015-04-29 DIAGNOSIS — M25562 Pain in left knee: Secondary | ICD-10-CM | POA: Diagnosis not present

## 2015-05-07 ENCOUNTER — Encounter: Payer: Self-pay | Admitting: Neurology

## 2015-05-07 ENCOUNTER — Ambulatory Visit (INDEPENDENT_AMBULATORY_CARE_PROVIDER_SITE_OTHER): Payer: Medicare Other | Admitting: Neurology

## 2015-05-07 VITALS — BP 164/89 | HR 90 | Ht 66.0 in | Wt 143.0 lb

## 2015-05-07 DIAGNOSIS — G40309 Generalized idiopathic epilepsy and epileptic syndromes, not intractable, without status epilepticus: Secondary | ICD-10-CM | POA: Diagnosis not present

## 2015-05-07 DIAGNOSIS — R413 Other amnesia: Secondary | ICD-10-CM | POA: Diagnosis not present

## 2015-05-07 DIAGNOSIS — R269 Unspecified abnormalities of gait and mobility: Secondary | ICD-10-CM

## 2015-05-07 MED ORDER — LEVETIRACETAM 500 MG PO TABS: 500.0000 mg | ORAL_TABLET | Freq: Two times a day (BID) | ORAL | Status: DC

## 2015-05-07 NOTE — Patient Instructions (Signed)

## 2015-05-07 NOTE — Progress Notes (Signed)
Reason for visit: Seizures  Jeffrey Castillo is an 64 y.o. male  History of present illness:  Jeffrey Castillo is a 64 year old right-handed white male with a history of seizures. The patient has done well since last seen, no recurring seizures have been noted. He recently fell in August 2016, fracturing the left lower leg. The patient required surgery. He is recovering from this fracture. The patient has a spinal stimulator in, and he has received epidural steroid injections in the back with some benefit. Overall, he has been relatively stable. He remains on Keppra and Lamictal, tolerating these medications well. He will be getting blood work done through his primary care physician in the near future.  Past Medical History  Diagnosis Date  . HYPERTENSION 07/31/2009  . Gait disorder   . Neuropathy (HCC)     feet  . Complex regional pain syndrome of left lower extremity   . Osteopenia   . Memory disorder   . Right humeral fracture   . Essential tremor     hx of  . Alcohol abuse   . COPD (chronic obstructive pulmonary disease) ([place])   . 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 Seizures ([place]) 2006  . Complication of anesthesia 08-05-2013    slow to awaken  . Acute pancreatitis     pseudo cyst    Past Surgical History  Procedure Laterality Date  . Foot surgery Left     2 occasions  . Shoulder surgery Right   . Vasectomy    . Bladder leision    . Bunionectomy Left   . Lumbar spine surgery      2 occasions for left L5 radiculopathy  . Cervical spine surgery  08/2010    C5-6-7  . Spinal cord stimulator implant  02/2011  . Esophagogastroduodenoscopy N/A 08/05/2013    Procedure: ESOPHAGOGASTRODUODENOSCOPY (EGD);  Surgeon: Jeryl Columbia, MD;  Location: [facility];  Service: Endoscopy;  Laterality: N/A;  . Squamous cell carcinoma removed  4 weeks ago    upper left arm, heaing well  . Eus N/A 10/23/2013    Procedure: ESOPHAGEAL ENDOSCOPIC ULTRASOUND (EUS) RADIAL;  Surgeon: Arta Silence, MD;   Location: [facility];  Service: Endoscopy;  Laterality: N/A;    Family History  Problem Relation Age of Onset  . Colon cancer Father   . Heart disease Brother     Social history:  reports that he has been smoking Cigarettes.  He has a 50 pack-year smoking history. He has never used smokeless tobacco. He reports that he does not drink alcohol or use illicit drugs.    Allergies  Allergen Reactions  . Diovan [Valsartan] Other (See Comments)    Lightheaded,dizziness  . Lyrica [Pregabalin] Other (See Comments)    Hallucinations.    . Sulfonamide Derivatives Other (See Comments)    unknown as a child    Medications:  Prior to Admission medications   Medication Sig Start Date End Date Taking? Authorizing Provider  alendronate (FOSAMAX) 70 MG tablet Take 70 mg by mouth once a week. Take with a full glass of water on an empty stomach.   Yes Historical Provider, MD  Calcium-Phosphorus-Vitamin D (CITRACAL +D3 PO) Take 500 mg by mouth 2 (two) times daily.   Yes Historical Provider, MD  cloNIDine (CATAPRES) 0.1 MG tablet Take 0.1 mg by mouth daily.   Yes Historical Provider, MD  Cyanocobalamin (VITAMIN B 12 PO) Take 1,000 mcg by mouth daily.   Yes Historical Provider, MD  ergocalciferol (VITAMIN D2)  50000 UNITS capsule Take 50,000 Units by mouth 2 (two) times a week.   Yes Historical Provider, MD  lamoTRIgine (LAMICTAL) 25 MG tablet Take 2 tablets (50 mg total) by mouth 2 (two) times daily. 04/02/15  Yes Kathrynn Ducking, MD  levETIRAcetam (KEPPRA) 500 MG tablet Take 1 tablet (500 mg total) by mouth 2 (two) times daily. 05/07/15  Yes Kathrynn Ducking, MD  lipase/protease/amylase (CREON-12/PANCREASE) 12000 UNITS CPEP capsule Take 3 capsules by mouth 3 (three) times daily before meals.   Yes Historical Provider, MD  naproxen sodium (ANAPROX) 220 MG tablet Take 220 mg by mouth once as needed (pain).   Yes Historical Provider, MD  pantoprazole (PROTONIX) 40 MG tablet Take 40 mg by mouth 2 (two)  times daily.   Yes Historical Provider, MD    ROS:  Out of a complete 14 system review of symptoms, the patient complains only of the following symptoms, and all other reviewed systems are negative.  Back pain  Blood pressure 164/89, pulse 90, height 5\' 6"  (1.676 m), weight 143 lb (64.864 kg).  Physical Exam  General: The patient is alert and cooperative at the time of the examination.  Skin: No significant peripheral edema is noted.   Neurologic Exam  Mental status: The patient is alert and oriented x 3 at the time of the examination. The patient has apparent normal recent and remote memory, with an apparently normal attention span and concentration ability.   Cranial nerves: Facial symmetry is present. Speech is normal, no aphasia or dysarthria is noted. Extraocular movements are full. Visual fields are full.  Motor: The patient has good strength in all 4 extremities.  Sensory examination: Soft touch sensation is symmetric on the face, arms, and legs.  Coordination: The patient has good finger-nose-finger and heel-to-shin bilaterally.  Gait and station: The patient has a limping gait on the left leg. The left leg is in a brace. Tandem gait was not attempted. Romberg is negative. No drift is seen.  Reflexes: Deep tendon reflexes are symmetric.   Assessment/Plan:  1. History of seizures   2. Chronic low back pain  The patient is doing well with the seizures, we will continue the Lamictal and Keppra. The patient will follow-up through this office in one year, sooner if needed. 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  Jill Alexanders MD 05/07/2015 8:24 PM  [facility] [address] [place], [place] [address]  Phone [phone] Fax [phone]

## 2015-05-27 DIAGNOSIS — B9620 Unspecified Escherichia coli [E. coli] as the cause of diseases classified elsewhere: Secondary | ICD-10-CM | POA: Diagnosis not present

## 2015-05-27 DIAGNOSIS — N390 Urinary tract infection, site not specified: Secondary | ICD-10-CM | POA: Diagnosis not present

## 2015-07-14 DIAGNOSIS — K861 Other chronic pancreatitis: Secondary | ICD-10-CM | POA: Diagnosis not present

## 2015-07-27 DIAGNOSIS — R413 Other amnesia: Secondary | ICD-10-CM | POA: Diagnosis not present

## 2015-07-27 DIAGNOSIS — F17210 Nicotine dependence, cigarettes, uncomplicated: Secondary | ICD-10-CM | POA: Diagnosis not present

## 2015-07-27 DIAGNOSIS — I700 Atherosclerosis of aorta: Secondary | ICD-10-CM | POA: Diagnosis not present

## 2015-07-27 DIAGNOSIS — Z79899 Other long term (current) drug therapy: Secondary | ICD-10-CM | POA: Diagnosis not present

## 2015-07-28 ENCOUNTER — Other Ambulatory Visit: Payer: Self-pay | Admitting: Family Medicine

## 2015-07-28 DIAGNOSIS — L821 Other seborrheic keratosis: Secondary | ICD-10-CM | POA: Diagnosis not present

## 2015-07-28 DIAGNOSIS — L570 Actinic keratosis: Secondary | ICD-10-CM | POA: Diagnosis not present

## 2015-07-28 DIAGNOSIS — D225 Melanocytic nevi of trunk: Secondary | ICD-10-CM | POA: Diagnosis not present

## 2015-07-28 DIAGNOSIS — R2689 Other abnormalities of gait and mobility: Secondary | ICD-10-CM | POA: Diagnosis not present

## 2015-07-28 DIAGNOSIS — M5137 Other intervertebral disc degeneration, lumbosacral region: Secondary | ICD-10-CM | POA: Diagnosis not present

## 2015-07-28 DIAGNOSIS — G894 Chronic pain syndrome: Secondary | ICD-10-CM | POA: Diagnosis not present

## 2015-07-28 DIAGNOSIS — Z85828 Personal history of other malignant neoplasm of skin: Secondary | ICD-10-CM | POA: Diagnosis not present

## 2015-07-28 DIAGNOSIS — M5117 Intervertebral disc disorders with radiculopathy, lumbosacral region: Secondary | ICD-10-CM | POA: Diagnosis not present

## 2015-07-28 DIAGNOSIS — M4806 Spinal stenosis, lumbar region: Secondary | ICD-10-CM | POA: Diagnosis not present

## 2015-07-28 DIAGNOSIS — M961 Postlaminectomy syndrome, not elsewhere classified: Secondary | ICD-10-CM | POA: Diagnosis not present

## 2015-07-28 DIAGNOSIS — L812 Freckles: Secondary | ICD-10-CM | POA: Diagnosis not present

## 2015-07-29 ENCOUNTER — Other Ambulatory Visit: Payer: Self-pay | Admitting: Family Medicine

## 2015-07-29 DIAGNOSIS — R413 Other amnesia: Secondary | ICD-10-CM

## 2015-07-29 DIAGNOSIS — Z720 Tobacco use: Secondary | ICD-10-CM

## 2015-08-03 ENCOUNTER — Ambulatory Visit
Admission: RE | Admit: 2015-08-03 | Discharge: 2015-08-03 | Disposition: A | Discharge status: Home / Self Care | Payer: Medicare Other | Source: Ambulatory Visit | Attending: Family Medicine | Admitting: Family Medicine

## 2015-08-03 DIAGNOSIS — R413 Other amnesia: Secondary | ICD-10-CM

## 2015-08-03 DIAGNOSIS — Z720 Tobacco use: Secondary | ICD-10-CM

## 2015-08-03 MED ORDER — IOPAMIDOL (ISOVUE-300) INJECTION 61%
75.0000 mL | Freq: Once | INTRAVENOUS | Status: AC | PRN
  Administered 2015-08-03: 75 mL via INTRAVENOUS

## 2015-08-04 ENCOUNTER — Encounter: Payer: Self-pay | Admitting: Neurology

## 2015-08-04 ENCOUNTER — Ambulatory Visit (INDEPENDENT_AMBULATORY_CARE_PROVIDER_SITE_OTHER): Payer: Medicare Other | Admitting: Neurology

## 2015-08-04 VITALS — BP 163/83 | HR 65 | Ht 67.0 in | Wt 146.0 lb

## 2015-08-04 DIAGNOSIS — R413 Other amnesia: Secondary | ICD-10-CM | POA: Diagnosis not present

## 2015-08-04 DIAGNOSIS — G40309 Generalized idiopathic epilepsy and epileptic syndromes, not intractable, without status epilepticus: Secondary | ICD-10-CM | POA: Diagnosis not present

## 2015-08-04 DIAGNOSIS — R269 Unspecified abnormalities of gait and mobility: Secondary | ICD-10-CM | POA: Diagnosis not present

## 2015-08-04 DIAGNOSIS — F1010 Alcohol abuse, uncomplicated: Secondary | ICD-10-CM

## 2015-08-04 DIAGNOSIS — Z5181 Encounter for therapeutic drug level monitoring: Secondary | ICD-10-CM

## 2015-08-04 NOTE — Patient Instructions (Signed)

## 2015-08-04 NOTE — Progress Notes (Signed)
Reason for visit: Memory disturbance  Referring physician: Dr. Ellison Carwin is a 65 y.o. male  History of present illness:  Mr. Prochnow is a 65 year old right-handed white male with a history of a seizure disorder and an underlying peripheral neuropathy. He has a history of alcohol abuse, this has been associated with some degree of cirrhosis of the liver and pancreatitis problems. The patient had quit drinking several years ago, but apparently he has started again in the summer of 2016. His wife has noted some cognitive changes since he started drinking again. He has had some short-term memory issues, some difficulty with remembering names for people. He continues to do the finances without difficulty, he never forgets to pay a bill. The patient occasionally will misplace things about the house. He is able to keep up with medications and appointments. He operates a Teacher, music without difficulty, he does not have any problems getting lost. The patient last had a seizure several years ago. He remains on Keppra and low-dose Lamictal. His wife indicates that he will go through a half gallon of vodka every 3 or 4 days. The patient stopped drinking 5 or 6 days ago. The patient does have a peripheral neuropathy and a chronic gait disorder associated with this. There have been no changes in gait, no recent falls. The patient uses a cane for ambulation. He reports no new numbness or weakness of the extremities, he denies headaches or dizziness. He comes to this office for an evaluation. He has had a recent CT of the head that shows some mild to moderate small vessel ischemic changes. Blood work to include a thyroid profile and a vitamin B12 level was unremarkable.  Past Medical History  Diagnosis Date  . HYPERTENSION 07/31/2009  . Gait disorder   . Neuropathy (HCC)     feet  . Complex regional pain syndrome of left lower extremity   . Osteopenia   . Memory disorder   . Right humeral  fracture   . Essential tremor     hx of  . Alcohol abuse   . COPD (chronic obstructive pulmonary disease) ([place])   . 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 Seizures ([place]) 2006  . Complication of anesthesia 08-05-2013    slow to awaken  . Acute pancreatitis     pseudo cyst    Past Surgical History  Procedure Laterality Date  . Foot surgery Left     2 occasions  . Shoulder surgery Right   . Vasectomy    . Bladder leision    . Bunionectomy Left   . Lumbar spine surgery      2 occasions for left L5 radiculopathy  . Cervical spine surgery  08/2010    C5-6-7  . Spinal cord stimulator implant  02/2011  . Esophagogastroduodenoscopy N/A 08/05/2013    Procedure: ESOPHAGOGASTRODUODENOSCOPY (EGD);  Surgeon: Jeryl Columbia, MD;  Location: [facility];  Service: Endoscopy;  Laterality: N/A;  . Squamous cell carcinoma removed  4 weeks ago    upper left arm, heaing well  . Eus N/A 10/23/2013    Procedure: ESOPHAGEAL ENDOSCOPIC ULTRASOUND (EUS) RADIAL;  Surgeon: Arta Silence, MD;  Location: [facility];  Service: Endoscopy;  Laterality: N/A;    Family History  Problem Relation Age of Onset  . Colon cancer Father   . Heart disease Brother     Social history:  reports that he has been smoking Cigarettes.  He has a 50 pack-year smoking history. He  has never used smokeless tobacco. He reports that he drinks alcohol. He reports that he does not use illicit drugs.  Medications:  Prior to Admission medications   Medication Sig Start Date End Date Taking? Authorizing Provider  Calcium-Phosphorus-Vitamin D (CITRACAL +D3 PO) Take 500 mg by mouth 2 (two) times daily.   Yes Historical Provider, MD  cloNIDine (CATAPRES) 0.1 MG tablet Take 0.1 mg by mouth daily.   Yes Historical Provider, MD  Cyanocobalamin (VITAMIN B 12 PO) Take 1,000 mcg by mouth daily.   Yes Historical Provider, MD  lamoTRIgine (LAMICTAL) 25 MG tablet Take 2 tablets (50 mg total) by mouth 2 (two) times daily. 04/02/15  Yes Kathrynn Ducking, MD   levETIRAcetam (KEPPRA) 500 MG tablet Take 1 tablet (500 mg total) by mouth 2 (two) times daily. 05/07/15  Yes Kathrynn Ducking, MD  naproxen sodium (ANAPROX) 220 MG tablet Take 220 mg by mouth once as needed (pain).   Yes Historical Provider, MD  pantoprazole (PROTONIX) 40 MG tablet Take 40 mg by mouth 2 (two) times daily.   Yes Historical Provider, MD      Allergies  Allergen Reactions  . Zolpidem Other (See Comments)  . Diovan [Valsartan] Other (See Comments)    Lightheaded,dizziness  . Lyrica [Pregabalin] Other (See Comments)    Hallucinations.    . Sulfonamide Derivatives Other (See Comments)    unknown as a child    ROS:  Out of a complete 14 system review of symptoms, the patient complains only of the following symptoms, and all other reviewed systems are negative.  Blurred vision Gait instability Numbness in the feet, seizures  Blood pressure 163/83, pulse 65, height 5\' 7"  (1.702 m), weight 146 lb (66.225 kg).  Physical Exam  General: The patient is alert and cooperative at the time of the examination. The patient appears tremulous at the time of the examination.  Eyes: Pupils are equal, round, and reactive to light. Discs are flat bilaterally.  Neck: The neck is supple, no carotid bruits are noted.  Respiratory: The respiratory examination is clear.  Cardiovascular: The cardiovascular examination reveals a regular rate and rhythm, no obvious murmurs or rubs are noted.  Skin: Extremities are without significant edema.  Neurologic Exam  Mental status: The patient is alert and oriented x 3 at the time of the examination. The patient has apparent normal recent and remote memory, with an apparently normal attention span and concentration ability. Mini-Mental Status Examination done today shows a total score 28/30. The patient is able to name 11 animals in 30 seconds.  Cranial nerves: Facial symmetry is present. There is good sensation of the face to pinprick and soft  touch bilaterally. The strength of the facial muscles and the muscles to head turning and shoulder shrug are normal bilaterally. Speech is well enunciated, no aphasia or dysarthria is noted. Extraocular movements are full. Visual fields are full. The tongue is midline, and the patient has symmetric elevation of the soft palate. No obvious hearing deficits are noted.  Motor: The motor testing reveals 5 over 5 strength of all 4 extremities. Good symmetric motor tone is noted throughout.  Sensory: Sensory testing is intact to pinprick, soft touch, vibration sensation, and position sense on the upper extremities. With the lower extremities, the patient has a stocking pattern pinprick sensory deficit up to the knees bilaterally. The patient has significant impairment of vibration sensation in both feet, mild impairment of position sensation in both feet. No evidence of extinction is noted.  Coordination: Cerebellar testing reveals good finger-nose-finger and heel-to-shin bilaterally. A mild action tremor seen with finger-nose-finger.  Gait and station: Gait is wide-based, slightly unsteady. Tandem gait is unsteady. The patient normally walks with a cane. Romberg is negative. No drift is seen.  Reflexes: Deep tendon reflexes are symmetric, but are depressed bilaterally. Toes are downgoing bilaterally.   CT head 08/03/15:  IMPRESSION: 1. Stable atrophy and white matter disease. This likely reflects sequela of chronic microvascular ischemia. 2. No acute abnormality or significant interval change. 3. Atherosclerosis.  * CT scan images were reviewed online. I agree with the written report.    Assessment/Plan:  1. Reported memory disturbance  2. History of seizure disorder, well controlled  3. Alcoholism with recent relapse  4. Peripheral neuropathy, gait instability  The patient has had some decline in memory which seems to correlate with when he started drinking again. The patient has once  again returned to drinking large amounts of vodka. 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 The patient need to be evaluated for elevations in the ammonia level which may impair cognitive processing. He will be sent for further blood work today. We have discussed going on medications to help him get off of alcohol and to enter a rehabilitation program. The patient does not wish to engage in either of these therapies. We will follow-up in 6 months. The patient will contact me if he changes his mind regarding treatment. The patient appears to be tremulous today, he may be having some mild withdrawal symptoms.  Jill Alexanders MD 08/04/2015 12:34 PM  [facility] [address] [place], [place] [address]  Phone [phone] Fax [phone]

## 2015-08-06 LAB — HIV ANTIBODY (ROUTINE TESTING W REFLEX): HIV Screen 4th Generation wRfx: NONREACTIVE

## 2015-08-06 LAB — RPR: RPR Ser Ql: NONREACTIVE

## 2015-08-06 LAB — COPPER, SERUM: Copper: 89 ug/dL (ref 72–166)

## 2015-08-06 LAB — AMMONIA: Ammonia: 52 ug/dL (ref 27–102)

## 2015-08-06 LAB — LAMOTRIGINE LEVEL: Lamotrigine Lvl: 3.4 ug/mL (ref 2.0–20.0)

## 2015-08-12 DIAGNOSIS — Z0000 Encounter for general adult medical examination without abnormal findings: Secondary | ICD-10-CM | POA: Diagnosis not present

## 2015-08-12 DIAGNOSIS — N3020 Other chronic cystitis without hematuria: Secondary | ICD-10-CM | POA: Diagnosis not present

## 2015-08-12 DIAGNOSIS — N390 Urinary tract infection, site not specified: Secondary | ICD-10-CM | POA: Diagnosis not present

## 2015-08-19 DIAGNOSIS — R3129 Other microscopic hematuria: Secondary | ICD-10-CM | POA: Diagnosis not present

## 2015-08-19 DIAGNOSIS — R310 Gross hematuria: Secondary | ICD-10-CM | POA: Diagnosis not present

## 2015-08-26 DIAGNOSIS — N3020 Other chronic cystitis without hematuria: Secondary | ICD-10-CM | POA: Diagnosis not present

## 2015-08-26 DIAGNOSIS — R3129 Other microscopic hematuria: Secondary | ICD-10-CM | POA: Diagnosis not present

## 2015-09-08 ENCOUNTER — Other Ambulatory Visit: Payer: Self-pay | Admitting: General Surgery

## 2015-09-08 DIAGNOSIS — R3989 Other symptoms and signs involving the genitourinary system: Secondary | ICD-10-CM

## 2015-09-11 DIAGNOSIS — R3989 Other symptoms and signs involving the genitourinary system: Secondary | ICD-10-CM | POA: Diagnosis not present

## 2015-09-15 ENCOUNTER — Ambulatory Visit
Admission: RE | Admit: 2015-09-15 | Discharge: 2015-09-15 | Disposition: A | Discharge status: Home / Self Care | Payer: Medicare Other | Source: Ambulatory Visit | Attending: General Surgery | Admitting: General Surgery

## 2015-09-15 DIAGNOSIS — R3989 Other symptoms and signs involving the genitourinary system: Secondary | ICD-10-CM

## 2015-09-15 MED ORDER — IOPAMIDOL (ISOVUE-300) INJECTION 61%
100.0000 mL | Freq: Once | INTRAVENOUS | Status: AC | PRN
  Administered 2015-09-15: 100 mL via INTRAVENOUS

## 2015-09-29 ENCOUNTER — Other Ambulatory Visit: Payer: Self-pay | Admitting: Acute Care

## 2015-09-29 DIAGNOSIS — F17210 Nicotine dependence, cigarettes, uncomplicated: Secondary | ICD-10-CM

## 2015-10-06 ENCOUNTER — Other Ambulatory Visit: Payer: Self-pay | Admitting: Neurosurgery

## 2015-10-06 DIAGNOSIS — M5416 Radiculopathy, lumbar region: Secondary | ICD-10-CM

## 2015-10-06 DIAGNOSIS — M4806 Spinal stenosis, lumbar region: Secondary | ICD-10-CM | POA: Diagnosis not present

## 2015-10-06 DIAGNOSIS — M5136 Other intervertebral disc degeneration, lumbar region: Secondary | ICD-10-CM | POA: Diagnosis not present

## 2015-10-15 ENCOUNTER — Ambulatory Visit
Admission: RE | Admit: 2015-10-15 | Discharge: 2015-10-15 | Disposition: A | Discharge status: Home / Self Care | Payer: Medicare Other | Source: Ambulatory Visit | Attending: Neurosurgery | Admitting: Neurosurgery

## 2015-10-15 DIAGNOSIS — M5416 Radiculopathy, lumbar region: Secondary | ICD-10-CM

## 2015-10-15 DIAGNOSIS — M4806 Spinal stenosis, lumbar region: Secondary | ICD-10-CM | POA: Diagnosis not present

## 2015-10-15 MED ORDER — IOHEXOL 180 MG/ML  SOLN
15.0000 mL | Freq: Once | INTRAMUSCULAR | Status: AC | PRN
  Administered 2015-10-15: 15 mL via INTRATHECAL

## 2015-10-15 MED ORDER — DIAZEPAM 5 MG PO TABS
10.0000 mg | ORAL_TABLET | Freq: Once | ORAL | Status: AC
  Administered 2015-10-15: 5 mg via ORAL

## 2015-10-15 NOTE — Discharge Instructions (Signed)

## 2015-10-26 DIAGNOSIS — K5730 Diverticulosis of large intestine without perforation or abscess without bleeding: Secondary | ICD-10-CM | POA: Diagnosis not present

## 2015-10-26 DIAGNOSIS — K605 Anorectal fistula: Secondary | ICD-10-CM | POA: Diagnosis not present

## 2015-10-29 DIAGNOSIS — M545 Low back pain: Secondary | ICD-10-CM | POA: Diagnosis not present

## 2015-10-29 DIAGNOSIS — I10 Essential (primary) hypertension: Secondary | ICD-10-CM | POA: Diagnosis not present

## 2015-10-29 DIAGNOSIS — M4806 Spinal stenosis, lumbar region: Secondary | ICD-10-CM | POA: Diagnosis not present

## 2015-10-29 DIAGNOSIS — M5416 Radiculopathy, lumbar region: Secondary | ICD-10-CM | POA: Diagnosis not present

## 2015-10-30 ENCOUNTER — Other Ambulatory Visit: Payer: Self-pay | Admitting: Neurology

## 2015-11-05 ENCOUNTER — Other Ambulatory Visit: Payer: Self-pay | Admitting: Neurosurgery

## 2015-11-16 DIAGNOSIS — R3989 Other symptoms and signs involving the genitourinary system: Secondary | ICD-10-CM | POA: Diagnosis not present

## 2015-11-17 DIAGNOSIS — D485 Neoplasm of uncertain behavior of skin: Secondary | ICD-10-CM | POA: Diagnosis not present

## 2015-12-03 ENCOUNTER — Other Ambulatory Visit: Payer: Self-pay | Admitting: Neurology

## 2015-12-11 NOTE — Pre-Procedure Instructions (Signed)
    Jeffrey Castillo  12/11/2015      [facility] - [place], [place] [address] Phone: [phone] Fax: [phone]    Your procedure is scheduled on Wed. June 21  Report to [facility] Admitting at 7:15 A.M.  Call this number if you have problems the morning of surgery:  [phone]   Remember:  Do not eat food or drink liquids after midnight.  Take these medicines the morning of surgery with A SIP OF WATER : clonidine(catapress), levetiracetam (keppra), lamictal                          1 week prior to surgery stop NSAIDS: advil, motrin, ibuprofen aleve and vitamins.   Do not wear jewelry.  Do not wear lotions, powders, or perfumes.  You may  Not wear deodorant.  Do not shave 48 hours prior to surgery.  Men may shave face and neck.  Do not bring valuables to the hospital.  [facility] is not responsible for any belongings or valuables.  Contacts, dentures or bridgework may not be worn into surgery.  Leave your suitcase in the car.  After surgery it may be brought to your room.  For patients admitted to the hospital, discharge time will be determined by your treatment team.  Patients discharged the day of surgery will not be allowed to drive home.   Name and phone number of your driver:    Special instructions: review preparing for surgery handout  Please read over the following fact sheets that you were given. Blood Transfusion Information and MRSA Information

## 2015-12-14 ENCOUNTER — Encounter (HOSPITAL_COMMUNITY): Payer: Self-pay

## 2015-12-14 ENCOUNTER — Encounter (HOSPITAL_COMMUNITY)
Admission: RE | Admit: 2015-12-14 | Discharge: 2015-12-14 | Disposition: A | Discharge status: Home / Self Care | Payer: Medicare Other | Source: Ambulatory Visit | Attending: Neurosurgery | Admitting: Neurosurgery

## 2015-12-14 DIAGNOSIS — Z01818 Encounter for other preprocedural examination: Secondary | ICD-10-CM | POA: Diagnosis not present

## 2015-12-14 DIAGNOSIS — R001 Bradycardia, unspecified: Secondary | ICD-10-CM | POA: Diagnosis not present

## 2015-12-14 DIAGNOSIS — Z01812 Encounter for preprocedural laboratory examination: Secondary | ICD-10-CM | POA: Insufficient documentation

## 2015-12-14 DIAGNOSIS — M4806 Spinal stenosis, lumbar region: Secondary | ICD-10-CM | POA: Diagnosis not present

## 2015-12-14 LAB — SURGICAL PCR SCREEN
MRSA, PCR: NEGATIVE
Staphylococcus aureus: NEGATIVE

## 2015-12-14 NOTE — Progress Notes (Signed)
PCP: Dr. Kathyrn Lass @ [facility] Gastroenterologist: Dr. Teena Irani and Dr. Corene Cornea conway @ [facility] Pain Management:  Dr. Gerlene Fee in [place] 

## 2015-12-21 ENCOUNTER — Encounter (HOSPITAL_COMMUNITY)
Admission: RE | Admit: 2015-12-21 | Discharge: 2015-12-21 | Disposition: A | Discharge status: Home / Self Care | Payer: Medicare Other | Source: Ambulatory Visit | Attending: Neurosurgery | Admitting: Neurosurgery

## 2015-12-21 DIAGNOSIS — Z0183 Encounter for blood typing: Secondary | ICD-10-CM | POA: Insufficient documentation

## 2015-12-21 DIAGNOSIS — M4806 Spinal stenosis, lumbar region: Secondary | ICD-10-CM | POA: Diagnosis not present

## 2015-12-21 DIAGNOSIS — Z01812 Encounter for preprocedural laboratory examination: Secondary | ICD-10-CM | POA: Diagnosis not present

## 2015-12-21 LAB — CBC
HCT: 46.1 % (ref 39.0–52.0)
Hemoglobin: 15.5 g/dL (ref 13.0–17.0)
MCH: 30.9 pg (ref 26.0–34.0)
MCHC: 33.6 g/dL (ref 30.0–36.0)
MCV: 92 fL (ref 78.0–100.0)
Platelets: 222 10*3/uL (ref 150–400)
RBC: 5.01 MIL/uL (ref 4.22–5.81)
RDW: 13 % (ref 11.5–15.5)
WBC: 7.7 10*3/uL (ref 4.0–10.5)

## 2015-12-21 LAB — BASIC METABOLIC PANEL
Anion gap: 8 (ref 5–15)
BUN: 13 mg/dL (ref 6–20)
CO2: 22 mmol/L (ref 22–32)
Calcium: 9.7 mg/dL (ref 8.9–10.3)
Chloride: 107 mmol/L (ref 101–111)
Creatinine, Ser: 0.94 mg/dL (ref 0.61–1.24)
GFR calc Af Amer: >60 mL/min (ref >60)
GFR calc non Af Amer: >60 mL/min (ref >60)
Glucose, Bld: 102 mg/dL — ABNORMAL HIGH (ref 65–99)
Potassium: 3.9 mmol/L (ref 3.5–5.1)
Sodium: 137 mmol/L (ref 135–145)

## 2015-12-21 LAB — TYPE AND SCREEN
ABO/RH(D): O POS
Antibody Screen: NEGATIVE

## 2015-12-21 LAB — ABO/RH: ABO/RH(D): O POS

## 2015-12-29 MED ORDER — CEFAZOLIN SODIUM-DEXTROSE 2-4 GM/100ML-% IV SOLN
2.0000 g | INTRAVENOUS | Status: AC
  Administered 2015-12-30: 2 g via INTRAVENOUS
  Filled 2015-12-29: qty 100

## 2015-12-30 ENCOUNTER — Inpatient Hospital Stay (HOSPITAL_COMMUNITY)
Admission: RE | Admit: 2015-12-30 | Discharge: 2015-12-31 | DRG: 460 | Disposition: A | Discharge status: Home / Self Care | Payer: Medicare Other | Source: Ambulatory Visit | Attending: Neurosurgery | Admitting: Neurosurgery

## 2015-12-30 ENCOUNTER — Inpatient Hospital Stay (HOSPITAL_COMMUNITY): Payer: Medicare Other

## 2015-12-30 ENCOUNTER — Inpatient Hospital Stay (HOSPITAL_COMMUNITY): Payer: Medicare Other | Admitting: Anesthesiology

## 2015-12-30 ENCOUNTER — Encounter (HOSPITAL_COMMUNITY): Payer: Self-pay | Admitting: *Deleted

## 2015-12-30 ENCOUNTER — Encounter (HOSPITAL_COMMUNITY)
Admission: RE | Disposition: A | Discharge status: Home / Self Care | Payer: Self-pay | Source: Ambulatory Visit | Attending: Neurosurgery

## 2015-12-30 DIAGNOSIS — M5116 Intervertebral disc disorders with radiculopathy, lumbar region: Secondary | ICD-10-CM | POA: Diagnosis not present

## 2015-12-30 DIAGNOSIS — M4806 Spinal stenosis, lumbar region: Secondary | ICD-10-CM | POA: Diagnosis present

## 2015-12-30 DIAGNOSIS — M4326 Fusion of spine, lumbar region: Secondary | ICD-10-CM | POA: Diagnosis not present

## 2015-12-30 DIAGNOSIS — J449 Chronic obstructive pulmonary disease, unspecified: Secondary | ICD-10-CM | POA: Diagnosis not present

## 2015-12-30 DIAGNOSIS — M549 Dorsalgia, unspecified: Secondary | ICD-10-CM | POA: Diagnosis not present

## 2015-12-30 DIAGNOSIS — M4316 Spondylolisthesis, lumbar region: Secondary | ICD-10-CM | POA: Diagnosis present

## 2015-12-30 SURGERY — POSTERIOR LUMBAR FUSION 1 LEVEL
Anesthesia: General | Site: Back

## 2015-12-30 MED ORDER — MENTHOL 3 MG MT LOZG: 1.0000 | LOZENGE | OROMUCOSAL | Status: DC | PRN

## 2015-12-30 MED ORDER — VANCOMYCIN HCL 1000 MG IV SOLR
INTRAVENOUS | Status: DC | PRN
  Administered 2015-12-30: 1000 mg

## 2015-12-30 MED ORDER — ALUM & MAG HYDROXIDE-SIMETH 200-200-20 MG/5ML PO SUSP: 30.0000 mL | Freq: Four times a day (QID) | ORAL | Status: DC | PRN

## 2015-12-30 MED ORDER — PROMETHAZINE HCL 25 MG/ML IJ SOLN: 6.2500 mg | INTRAMUSCULAR | Status: DC | PRN

## 2015-12-30 MED ORDER — FENTANYL CITRATE (PF) 250 MCG/5ML IJ SOLN
INTRAMUSCULAR | Status: AC
  Filled 2015-12-30: qty 5

## 2015-12-30 MED ORDER — ARTIFICIAL TEARS OP OINT
TOPICAL_OINTMENT | OPHTHALMIC | Status: AC
  Filled 2015-12-30: qty 3.5

## 2015-12-30 MED ORDER — BISACODYL 10 MG RE SUPP: 10.0000 mg | Freq: Every day | RECTAL | Status: DC | PRN

## 2015-12-30 MED ORDER — HYDROMORPHONE HCL 1 MG/ML IJ SOLN
INTRAMUSCULAR | Status: AC
  Administered 2015-12-30: 0.5 mg via INTRAVENOUS
  Filled 2015-12-30: qty 1

## 2015-12-30 MED ORDER — PHENOL 1.4 % MT LIQD: 1.0000 | OROMUCOSAL | Status: DC | PRN

## 2015-12-30 MED ORDER — DOCUSATE SODIUM 100 MG PO CAPS
100.0000 mg | ORAL_CAPSULE | Freq: Two times a day (BID) | ORAL | Status: DC
  Administered 2015-12-30 – 2015-12-31 (×2): 100 mg via ORAL
  Filled 2015-12-30 (×2): qty 1

## 2015-12-30 MED ORDER — PROPOFOL 10 MG/ML IV BOLUS
INTRAVENOUS | Status: AC
  Filled 2015-12-30: qty 40

## 2015-12-30 MED ORDER — ONDANSETRON HCL 4 MG/2ML IJ SOLN: 4.0000 mg | INTRAMUSCULAR | Status: DC | PRN

## 2015-12-30 MED ORDER — LACTATED RINGERS IV SOLN: INTRAVENOUS | Status: DC

## 2015-12-30 MED ORDER — MORPHINE SULFATE (PF) 2 MG/ML IV SOLN
1.0000 mg | INTRAVENOUS | Status: DC | PRN
Start: 2015-12-30 — End: 2015-12-31

## 2015-12-30 MED ORDER — PROPOFOL 10 MG/ML IV BOLUS
INTRAVENOUS | Status: DC | PRN
  Administered 2015-12-30: 130 mg via INTRAVENOUS

## 2015-12-30 MED ORDER — LAMOTRIGINE 25 MG PO TABS
50.0000 mg | ORAL_TABLET | Freq: Two times a day (BID) | ORAL | Status: DC
  Administered 2015-12-30 – 2015-12-31 (×2): 50 mg via ORAL
  Filled 2015-12-30 (×2): qty 2

## 2015-12-30 MED ORDER — ROCURONIUM BROMIDE 50 MG/5ML IV SOLN
INTRAVENOUS | Status: AC
  Filled 2015-12-30: qty 1

## 2015-12-30 MED ORDER — FENTANYL CITRATE (PF) 100 MCG/2ML IJ SOLN
INTRAMUSCULAR | Status: DC | PRN
  Administered 2015-12-30 (×3): 50 ug via INTRAVENOUS
  Administered 2015-12-30: 100 ug via INTRAVENOUS

## 2015-12-30 MED ORDER — BUPIVACAINE LIPOSOME 1.3 % IJ SUSP
20.0000 mL | Freq: Once | INTRAMUSCULAR | Status: DC
  Filled 2015-12-30: qty 20

## 2015-12-30 MED ORDER — ONDANSETRON HCL 4 MG/2ML IJ SOLN
INTRAMUSCULAR | Status: AC
  Filled 2015-12-30: qty 2

## 2015-12-30 MED ORDER — LACTATED RINGERS IV SOLN
INTRAVENOUS | Status: DC
Start: 2015-12-30 — End: 2015-12-30
  Administered 2015-12-30 (×2): via INTRAVENOUS

## 2015-12-30 MED ORDER — THROMBIN 20000 UNITS EX SOLR
CUTANEOUS | Status: DC | PRN
  Administered 2015-12-30: 20 mL via TOPICAL

## 2015-12-30 MED ORDER — CEFAZOLIN SODIUM-DEXTROSE 2-4 GM/100ML-% IV SOLN
2.0000 g | Freq: Three times a day (TID) | INTRAVENOUS | Status: AC
  Administered 2015-12-30 – 2015-12-31 (×2): 2 g via INTRAVENOUS
  Filled 2015-12-30 (×2): qty 100

## 2015-12-30 MED ORDER — HYDROCODONE-ACETAMINOPHEN 7.5-325 MG PO TABS: 1.0000 | ORAL_TABLET | Freq: Once | ORAL | Status: DC | PRN

## 2015-12-30 MED ORDER — DIAZEPAM 5 MG PO TABS
5.0000 mg | ORAL_TABLET | Freq: Four times a day (QID) | ORAL | Status: DC | PRN
  Administered 2015-12-30 – 2015-12-31 (×3): 5 mg via ORAL
  Filled 2015-12-30 (×2): qty 1

## 2015-12-30 MED ORDER — OXYCODONE-ACETAMINOPHEN 5-325 MG PO TABS
1.0000 | ORAL_TABLET | ORAL | Status: DC | PRN
  Administered 2015-12-30: 2 via ORAL
  Administered 2015-12-30: 1 via ORAL
  Administered 2015-12-31 (×2): 2 via ORAL
  Filled 2015-12-30: qty 1
  Filled 2015-12-30 (×3): qty 2

## 2015-12-30 MED ORDER — ONDANSETRON HCL 4 MG/2ML IJ SOLN
INTRAMUSCULAR | Status: DC | PRN
  Administered 2015-12-30: 4 mg via INTRAVENOUS

## 2015-12-30 MED ORDER — BUPIVACAINE LIPOSOME 1.3 % IJ SUSP
INTRAMUSCULAR | Status: DC | PRN
  Administered 2015-12-30: 20 mL

## 2015-12-30 MED ORDER — LAMOTRIGINE 25 MG PO TABS
25.0000 mg | ORAL_TABLET | Freq: Two times a day (BID) | ORAL | Status: DC
  Filled 2015-12-30: qty 1

## 2015-12-30 MED ORDER — LEVETIRACETAM 500 MG PO TABS
500.0000 mg | ORAL_TABLET | Freq: Two times a day (BID) | ORAL | Status: DC
  Administered 2015-12-30 – 2015-12-31 (×2): 500 mg via ORAL
  Filled 2015-12-30 (×2): qty 1

## 2015-12-30 MED ORDER — HYDROCODONE-ACETAMINOPHEN 5-325 MG PO TABS: 1.0000 | ORAL_TABLET | ORAL | Status: DC | PRN

## 2015-12-30 MED ORDER — DIAZEPAM 5 MG PO TABS
ORAL_TABLET | ORAL | Status: AC
  Administered 2015-12-30: 5 mg via ORAL
  Filled 2015-12-30: qty 1

## 2015-12-30 MED ORDER — 0.9 % SODIUM CHLORIDE (POUR BTL) OPTIME
TOPICAL | Status: DC | PRN
  Administered 2015-12-30: 1000 mL

## 2015-12-30 MED ORDER — ACETAMINOPHEN 325 MG PO TABS: 650.0000 mg | ORAL_TABLET | ORAL | Status: DC | PRN

## 2015-12-30 MED ORDER — BACITRACIN ZINC 500 UNIT/GM EX OINT
TOPICAL_OINTMENT | CUTANEOUS | Status: DC | PRN
  Administered 2015-12-30: 1 via TOPICAL

## 2015-12-30 MED ORDER — HYDROMORPHONE HCL 1 MG/ML IJ SOLN
0.2500 mg | INTRAMUSCULAR | Status: DC | PRN
  Administered 2015-12-30 (×2): 0.5 mg via INTRAVENOUS

## 2015-12-30 MED ORDER — ACETAMINOPHEN 650 MG RE SUPP: 650.0000 mg | RECTAL | Status: DC | PRN

## 2015-12-30 MED ORDER — VANCOMYCIN HCL 1000 MG IV SOLR
INTRAVENOUS | Status: AC
  Filled 2015-12-30: qty 1000

## 2015-12-30 MED ORDER — LIDOCAINE HCL (CARDIAC) 20 MG/ML IV SOLN
INTRAVENOUS | Status: DC | PRN
  Administered 2015-12-30: 60 mg via INTRAVENOUS

## 2015-12-30 MED ORDER — BUPIVACAINE-EPINEPHRINE (PF) 0.5% -1:200000 IJ SOLN
INTRAMUSCULAR | Status: DC | PRN
  Administered 2015-12-30: 10 mL via PERINEURAL

## 2015-12-30 MED ORDER — BACITRACIN 50000 UNITS IM SOLR
INTRAMUSCULAR | Status: DC | PRN
  Administered 2015-12-30: 500 mL

## 2015-12-30 MED ORDER — SUGAMMADEX SODIUM 200 MG/2ML IV SOLN
INTRAVENOUS | Status: DC | PRN
  Administered 2015-12-30: 200 mg via INTRAVENOUS

## 2015-12-30 MED ORDER — MIDAZOLAM HCL 2 MG/2ML IJ SOLN
INTRAMUSCULAR | Status: AC
  Filled 2015-12-30: qty 2

## 2015-12-30 MED ORDER — CLONIDINE HCL 0.1 MG PO TABS
0.1000 mg | ORAL_TABLET | Freq: Every day | ORAL | Status: DC
  Administered 2015-12-31: 0.1 mg via ORAL
  Filled 2015-12-30: qty 1

## 2015-12-30 MED ORDER — ROCURONIUM BROMIDE 100 MG/10ML IV SOLN
INTRAVENOUS | Status: DC | PRN
  Administered 2015-12-30: 40 mg via INTRAVENOUS
  Administered 2015-12-30: 20 mg via INTRAVENOUS
  Administered 2015-12-30: 10 mg via INTRAVENOUS

## 2015-12-30 MED ORDER — MIDAZOLAM HCL 5 MG/5ML IJ SOLN
INTRAMUSCULAR | Status: DC | PRN
  Administered 2015-12-30: 2 mg via INTRAVENOUS

## 2015-12-30 MED FILL — Sodium Chloride IV Soln 0.9%: INTRAVENOUS | Qty: 1000 | Status: AC

## 2015-12-30 MED FILL — Heparin Sodium (Porcine) Inj 1000 Unit/ML: INTRAMUSCULAR | Qty: 30 | Status: AC

## 2015-12-30 SURGICAL SUPPLY — 69 items
APL SKNCLS STERI-STRIP NONHPOA (GAUZE/BANDAGES/DRESSINGS) ×1
BAG DECANTER FOR FLEXI CONT (MISCELLANEOUS) ×2 IMPLANT
BENZOIN TINCTURE PRP APPL 2/3 (GAUZE/BANDAGES/DRESSINGS) ×2 IMPLANT
BLADE CLIPPER SURG (BLADE) IMPLANT
BRUSH SCRUB EZ PLAIN DRY (MISCELLANEOUS) ×2 IMPLANT
BUR MATCHSTICK NEURO 3.0 LAGG (BURR) ×2 IMPLANT
BUR PRECISION FLUTE 6.0 (BURR) ×2 IMPLANT
CAGE ALTERA 10X31X9-13 15D (Cage) ×1 IMPLANT
CANISTER SUCT 3000ML PPV (MISCELLANEOUS) ×2 IMPLANT
CAP REVERE LOCKING (Cap) ×4 IMPLANT
CONT SPEC 4OZ CLIKSEAL STRL BL (MISCELLANEOUS) ×2 IMPLANT
COVER BACK TABLE 60X90IN (DRAPES) ×2 IMPLANT
DRAPE C-ARM 42X72 X-RAY (DRAPES) ×4 IMPLANT
DRAPE LAPAROTOMY 100X72X124 (DRAPES) ×2 IMPLANT
DRAPE POUCH INSTRU U-SHP 10X18 (DRAPES) ×2 IMPLANT
DRAPE PROXIMA HALF (DRAPES) ×2 IMPLANT
DRAPE SURG 17X23 STRL (DRAPES) ×8 IMPLANT
ELECT BLADE 4.0 EZ CLEAN MEGAD (MISCELLANEOUS) ×2
ELECT REM PT RETURN 9FT ADLT (ELECTROSURGICAL) ×2
ELECTRODE BLDE 4.0 EZ CLN MEGD (MISCELLANEOUS) ×1 IMPLANT
ELECTRODE REM PT RTRN 9FT ADLT (ELECTROSURGICAL) ×1 IMPLANT
EVACUATOR 1/8 PVC DRAIN (DRAIN) IMPLANT
GAUZE SPONGE 4X4 12PLY STRL (GAUZE/BANDAGES/DRESSINGS) ×2 IMPLANT
GAUZE SPONGE 4X4 16PLY XRAY LF (GAUZE/BANDAGES/DRESSINGS) ×2 IMPLANT
GLOVE BIO SURGEON STRL SZ8 (GLOVE) ×4 IMPLANT
GLOVE BIO SURGEON STRL SZ8.5 (GLOVE) ×4 IMPLANT
GLOVE BIOGEL PI IND STRL 6.5 (GLOVE) IMPLANT
GLOVE BIOGEL PI IND STRL 7.0 (GLOVE) IMPLANT
GLOVE BIOGEL PI IND STRL 8 (GLOVE) IMPLANT
GLOVE BIOGEL PI INDICATOR 6.5 (GLOVE) ×2
GLOVE BIOGEL PI INDICATOR 7.0 (GLOVE) ×1
GLOVE BIOGEL PI INDICATOR 8 (GLOVE) ×2
GLOVE ECLIPSE 7.5 STRL STRAW (GLOVE) ×4 IMPLANT
GLOVE EXAM NITRILE LRG STRL (GLOVE) IMPLANT
GLOVE EXAM NITRILE MD LF STRL (GLOVE) IMPLANT
GLOVE EXAM NITRILE XL STR (GLOVE) IMPLANT
GLOVE EXAM NITRILE XS STR PU (GLOVE) IMPLANT
GLOVE SURG SS PI 6.5 STRL IVOR (GLOVE) ×3 IMPLANT
GOWN STRL REUS W/ TWL LRG LVL3 (GOWN DISPOSABLE) IMPLANT
GOWN STRL REUS W/ TWL XL LVL3 (GOWN DISPOSABLE) ×2 IMPLANT
GOWN STRL REUS W/TWL 2XL LVL3 (GOWN DISPOSABLE) IMPLANT
GOWN STRL REUS W/TWL LRG LVL3 (GOWN DISPOSABLE)
GOWN STRL REUS W/TWL XL LVL3 (GOWN DISPOSABLE) ×4
KIT BASIN OR (CUSTOM PROCEDURE TRAY) ×2 IMPLANT
KIT ROOM TURNOVER OR (KITS) ×2 IMPLANT
NDL HYPO 21X1.5 SAFETY (NEEDLE) IMPLANT
NEEDLE HYPO 21X1.5 SAFETY (NEEDLE) IMPLANT
NEEDLE HYPO 22GX1.5 SAFETY (NEEDLE) ×2 IMPLANT
NS IRRIG 1000ML POUR BTL (IV SOLUTION) ×2 IMPLANT
PACK LAMINECTOMY NEURO (CUSTOM PROCEDURE TRAY) ×2 IMPLANT
PAD ARMBOARD 7.5X6 YLW CONV (MISCELLANEOUS) ×6 IMPLANT
PATTIES SURGICAL .5 X1 (DISPOSABLE) IMPLANT
ROD REVERE 6.35 40MM (Rod) ×2 IMPLANT
SCREW REVERE 6.5X50MM (Screw) ×4 IMPLANT
SPONGE LAP 4X18 X RAY DECT (DISPOSABLE) IMPLANT
SPONGE NEURO XRAY DETECT 1X3 (DISPOSABLE) IMPLANT
SPONGE SURGIFOAM ABS GEL 100 (HEMOSTASIS) ×2 IMPLANT
STRIP BIOACTIVE 20CC 25X100X8 (Miscellaneous) ×1 IMPLANT
STRIP CLOSURE SKIN 1/2X4 (GAUZE/BANDAGES/DRESSINGS) ×2 IMPLANT
SUT PROLENE 6 0 BV (SUTURE) ×1 IMPLANT
SUT VIC AB 1 CT1 18XBRD ANBCTR (SUTURE) ×2 IMPLANT
SUT VIC AB 1 CT1 8-18 (SUTURE) ×4
SUT VIC AB 2-0 CP2 18 (SUTURE) ×4 IMPLANT
TAPE CLOTH SURG 4X10 WHT LF (GAUZE/BANDAGES/DRESSINGS) ×1 IMPLANT
TAPE STRIPS DRAPE STRL (GAUZE/BANDAGES/DRESSINGS) ×1 IMPLANT
TOWEL OR 17X24 6PK STRL BLUE (TOWEL DISPOSABLE) ×2 IMPLANT
TOWEL OR 17X26 10 PK STRL BLUE (TOWEL DISPOSABLE) ×2 IMPLANT
TRAY FOLEY W/METER SILVER 16FR (SET/KITS/TRAYS/PACK) ×2 IMPLANT
WATER STERILE IRR 1000ML POUR (IV SOLUTION) ×2 IMPLANT

## 2015-12-30 NOTE — Anesthesia Preprocedure Evaluation (Addendum)
Anesthesia Evaluation  Patient identified by MRN, date of birth, ID band Patient awake    Reviewed: Allergy & Precautions, NPO status , Patient's Chart, lab work & pertinent test results  Airway Mallampati: I  TM Distance: >3 FB Neck ROM: Full    Dental  (+) Dental Advisory Given   Pulmonary COPD, Current Smoker,    breath sounds clear to auscultation       Cardiovascular hypertension, Pt. on medications  Rhythm:Regular Rate:Normal     Neuro/Psych Seizures -,   Neuromuscular disease    GI/Hepatic Neg liver ROS, PUD,   Endo/Other  negative endocrine ROS  Renal/GU negative Renal ROS     Musculoskeletal negative musculoskeletal ROS (+)   Abdominal   Peds  Hematology negative hematology ROS (+)   Anesthesia Other Findings   Reproductive/Obstetrics                            Lab Results  Component Value Date   WBC 7.7 12/21/2015   HGB 15.5 12/21/2015   HCT 46.1 12/21/2015   MCV 92.0 12/21/2015   PLT 222 12/21/2015   Lab Results  Component Value Date   CREATININE 0.94 12/21/2015   BUN 13 12/21/2015   NA 137 12/21/2015   K 3.9 12/21/2015   CL 107 12/21/2015   CO2 22 12/21/2015    Anesthesia Physical Anesthesia Plan  ASA: II  Anesthesia Plan: General   Post-op Pain Management:    Induction: Intravenous  Airway Management Planned: Oral ETT  Additional Equipment:   Intra-op Plan:   Post-operative Plan: Extubation in OR  Informed Consent: I have reviewed the patients History and Physical, chart, labs and discussed the procedure including the risks, benefits and alternatives for the proposed anesthesia with the patient or authorized representative who has indicated his/her understanding and acceptance.   Dental advisory given  Plan Discussed with: CRNA  Anesthesia Plan Comments:        Anesthesia Quick Evaluation

## 2015-12-30 NOTE — Anesthesia Postprocedure Evaluation (Signed)
Anesthesia Post Note  Patient: [name]  Procedure(s) Performed: Procedure(s) (LRB): L4-5 Posterior lumbar interbody fusion/interbody prosthesis/posterior non segmental instrumentation/posterolateral arthrodesis (N/A)  Patient location during evaluation: PACU Anesthesia Type: General Level of consciousness: awake and alert Pain management: pain level controlled Vital Signs Assessment: post-procedure vital signs reviewed and stable Respiratory status: spontaneous breathing, nonlabored ventilation, respiratory function stable and patient connected to nasal cannula oxygen Cardiovascular status: blood pressure returned to baseline and stable Postop Assessment: no signs of nausea or vomiting Anesthetic complications: no    Last Vitals:  Filed Vitals:   12/30/15 1625 12/30/15 1645  BP:  137/79  Pulse: 55 84  Temp:  36.8 C  Resp: 10 18    Last Pain:  Filed Vitals:   12/30/15 1654  PainSc: 3                  Tiajuana Amass

## 2015-12-30 NOTE — Transfer of Care (Signed)
Immediate Anesthesia Transfer of Care Note  Patient: Jeffrey Castillo  Procedure(s) Performed: Procedure(s) with comments: L4-5 Posterior lumbar interbody fusion/interbody prosthesis/posterior non segmental instrumentation/posterolateral arthrodesis (N/A) - L4-5 Posterior lumbar interbody fusion/interbody prosthesis/posterior non segmental instrumentation/posterolateral arthrodesis  Patient Location: PACU  Anesthesia Type:General  Level of Consciousness: awake, alert  and oriented  Airway & Oxygen Therapy: Patient Spontanous Breathing and Patient connected to nasal cannula oxygen  Post-op Assessment: Report given to RN and Post -op Vital signs reviewed and stable  Post vital signs: Reviewed and stable  Last Vitals:  Filed Vitals:   12/30/15 0744 12/30/15 1448  BP: 133/77 124/62  Pulse: 62 73  Temp: 36.7 C 36.8 C  Resp: 20 20    Last Pain: There were no vitals filed for this visit.    Patients Stated Pain Goal: 3 ([id number])  Complications: No apparent anesthesia complications

## 2015-12-30 NOTE — Anesthesia Procedure Notes (Signed)
Procedure Name: Intubation Date/Time: 12/30/2015 10:59 AM Performed by: [place] Pink Pre-anesthesia Checklist: Patient identified, Emergency Drugs available, Suction available, Patient being monitored and Timeout performed Patient Re-evaluated:Patient Re-evaluated prior to inductionOxygen Delivery Method: Circle system utilized Preoxygenation: Pre-oxygenation with 100% oxygen Intubation Type: IV induction Ventilation: Mask ventilation without difficulty Laryngoscope Size: Mac and 4 Grade View: Grade I Tube type: Oral Tube size: 7.5 mm Number of attempts: 1 Airway Equipment and Method: Stylet Placement Confirmation: ETT inserted through vocal cords under direct vision,  positive ETCO2 and breath sounds checked- equal and bilateral Secured at: 23 cm Tube secured with: Tape Dental Injury: Teeth and Oropharynx as per pre-operative assessment

## 2015-12-30 NOTE — Progress Notes (Signed)
Patient ID: Jeffrey Castillo, male   DOB: [birth year], 65 y.o.   MRN: [id number] Subjective:  The patient is somnolent but easily arousable. He is in no apparent distress. He looks well.  Objective: Vital signs in last 24 hours: Temp:  [98 F (36.7 C)-98.3 F (36.8 C)] 98.3 F (36.8 C) (06/21 1448) Pulse Rate:  [62-73] 73 (06/21 1448) Resp:  [20] 20 (06/21 1448) BP: (124-133)/(62-77) 124/62 mmHg (06/21 1448) SpO2:  [98 %-99 %] 99 % (06/21 1448) Weight:  [64.864 kg (143 lb)] 64.864 kg (143 lb) (06/21 0744)  Intake/Output from previous day:   Intake/Output this shift: Total I/O In: 1900 [I.V.:1900] Out: 350 [Urine:150; Blood:200]  Physical exam the patient is somnolent but arousable. He is moving his lower extremities well.  Lab Results: No results for input(s): WBC, HGB, HCT, PLT in the last 72 hours. BMET No results for input(s): NA, K, CL, CO2, GLUCOSE, BUN, CREATININE, CALCIUM in the last 72 hours.  Studies/Results: Dg Lumbar Spine 2-3 Views  12/30/2015  CLINICAL DATA:  L4-5 posterior lumbar interbody fusion. EXAM: DG C-ARM 61-120 MIN; LUMBAR SPINE - 2-3 VIEW COMPARISON:  Lumbar CT myelogram 10/15/2015. FINDINGS: Spot PA and lateral fluoroscopic images of the lower lumbar spine are submitted. These demonstrate laminectomy at L4-5 with placement of bilateral pedicle screws and an interbody spacer. The interconnecting rods have not yet been placed. The hardware is well positioned. No complications are identified. IMPRESSION: Intraoperative views during L4-5 PLIF. No demonstrated complication. Electronically Signed   By: Richardean Sale M.D.   On: 12/30/2015 14:21   Dg C-arm 1-60 Min  12/30/2015  CLINICAL DATA:  L4-5 posterior lumbar interbody fusion. EXAM: DG C-ARM 61-120 MIN; LUMBAR SPINE - 2-3 VIEW COMPARISON:  Lumbar CT myelogram 10/15/2015. FINDINGS: Spot PA and lateral fluoroscopic images of the lower lumbar spine are submitted. These demonstrate laminectomy at L4-5 with placement  of bilateral pedicle screws and an interbody spacer. The interconnecting rods have not yet been placed. The hardware is well positioned. No complications are identified. IMPRESSION: Intraoperative views during L4-5 PLIF. No demonstrated complication. Electronically Signed   By: Richardean Sale M.D.   On: 12/30/2015 14:21    Assessment/Plan: The patient is doing well.  LOS: 0 days     Jeffrey Castillo D 12/30/2015, 2:58 PM

## 2015-12-30 NOTE — Op Note (Signed)
Brief history: The patient is a 65 year old white male who's had chronic back pain. He's had previous back surgery including a placement of a spinal cord stimulator. He has developed worsening back and leg pain consistent with a lumbar radiculopathy. He was worked up with a lumbar myelo CT which demonstrated an L4-5 spondylolisthesis with foraminal stenosis as well as diffuse lumbar degenerative changes. I discussed the situation with the patient. We discussed the various treatment options including surgery. He has weighed the risks, benefits, and alternatives to surgery and decided proceed with a L4-5 decompression, instrumentation, and fusion.  Preoperative diagnosis: L4-5 spondylolisthesis, Degenerative disc disease, spinal stenosis compressing both the L4 and the L5 nerve roots; lumbago; lumbar radiculopathy  Postoperative diagnosis: The same  Procedure: Bilateral  L4-5 Laminotomy/foraminotomies to decompress the bilateral L4 and L5 nerve roots(the work required to do this was in addition to the work required to do the posterior lumbar interbody fusion because of the patient's spinal stenosis, facet arthropathy. Etc. requiring a wide decompression of the nerve roots.); L4-5 posterior lumbar interbody fusion with local morselized autograft bone and Kinnex graft extender; insertion of interbody prosthesis at L4-5 (globus peek interbody prosthesis); posterior nonsegmental instrumentation from L4 to L5 with globus titanium pedicle screws and rods; posterior lateral arthrodesis at L4-5 with local morselized autograft bone and Kinnex bone graft extender.  Surgeon: Dr. Earle Gell  Asst.: Dr. Cyndy Freeze  Anesthesia: Gen. endotracheal  Estimated blood loss: 200 mL  Drains: None  Complications: None  Description of procedure: The patient was brought to the operating room by the anesthesia team. General endotracheal anesthesia was induced. The patient was turned to the prone position on the Wilson frame.  The patient's lumbosacral region was then prepared with Betadine scrub and Betadine solution. Sterile drapes were applied.  I then injected the area to be incised with Marcaine with epinephrine solution. I then used the scalpel to make a linear midline incision over the L4-5 interspace, incising through the old surgical scar. I then used electrocautery to perform a bilateral subperiosteal dissection exposing the facets at L4-5 and L3-4. We then obtained intraoperative radiograph to confirm our location. We then inserted the Verstrac retractor to provide exposure.  I began the decompression by using the high speed drill to perform bilateral L4-5 laminotomies. We then used the Kerrison punches to widen the laminotomy and removed the ligamentum flavum at L4-5. We used the Kerrison punches to remove the medial facets at L4-5. We performed wide foraminotomies about the bilateral L4 and L5 nerve roots completing the decompression.  We now turned our attention to the posterior lumbar interbody fusion. I used a scalpel to incise the intervertebral disc at L4-5 bilaterally. I then performed a partial intervertebral discectomy at L4-5 laterally using the pituitary forceps. We prepared the vertebral endplates at 075-GRM bilaterally for the fusion by removing the soft tissues with the curettes. We then used the trial spacers to pick the appropriate sized interbody prosthesis. We prefilled his prosthesis with a combination of local morselized autograft bone that we obtained during the decompression as well as Kinnex bone graft extender. We inserted the prefilled prosthesis into the interspace at L4-5, we then expanded the prosthesis. There was a good snug fit of the prosthesis in the interspace. We then filled and the remainder of the intervertebral disc space with local morselized autograft bone and Kinnex. This completed the posterior lumbar interbody arthrodesis.  We now turned attention to the instrumentation. Under  fluoroscopic guidance we cannulated the bilateral L4  and L5 pedicles with the bone probe. We then removed the bone probe. We then tapped the pedicle with a 5.5 millimeter tap. We then removed the tap. We probed inside the tapped pedicle with a ball probe to rule out cortical breaches. We then inserted a 6.5 x 15 millimeter pedicle screw into the L4 and L5 pedicles bilaterally under fluoroscopic guidance. We then palpated along the medial aspect of the pedicles to rule out cortical breaches. There were none. The nerve roots were not injured. We then connected the unilateral pedicle screws with a lordotic rod. We compressed the construct and secured the rod in place with the caps. We then tightened the caps appropriately. This completed the instrumentation from L4-5.  We now turned our attention to the posterior lateral arthrodesis at L4-5. We used the high-speed drill to decorticate the remainder of the facets, pars, transverse process at L4-5. We then applied a combination of local morselized autograft bone and Kinnex bone graft extender over these decorticated posterior lateral structures. This completed the posterior lateral arthrodesis.  We then obtained hemostasis using bipolar electrocautery. We irrigated the wound out with bacitracin solution. We inspected the thecal sac and nerve roots and noted they were well decompressed. We then removed the retractor. We placed vancomycin powder in the wound. We reapproximated patient's thoracolumbar fascia with interrupted #1 Vicryl suture. We reapproximated patient's subcutaneous tissue with interrupted 2-0 Vicryl suture. The reapproximated patient's skin with Steri-Strips and benzoin. The wound was then coated with bacitracin ointment. A sterile dressing was applied. The drapes were removed. The patient was subsequently returned to the supine position where they were extubated by the anesthesia team. He was then transported to the post anesthesia care unit in stable  condition. All sponge instrument and needle counts were reportedly correct at the end of this case.

## 2015-12-30 NOTE — H&P (Signed)
Subjective: The patient is a 65 year old white male who has complained of back, buttock, and leg pain consistent with neurogenic claudication. He has failed medical management and was worked up with a lumbar myelo CT. This demonstrated diffuse degenerative changes, most significant at L4-5 where he has a spondylolisthesis and spinal stenosis. I discussed situation with the patient. We discussed the various treatment options. He has decided to proceed with surgery.   Past Medical History  Diagnosis Date  . HYPERTENSION 07/31/2009  . Gait disorder   . Neuropathy (HCC)     feet  . Complex regional pain syndrome of left lower extremity   . Osteopenia   . Memory disorder   . Right humeral fracture   . Essential tremor     hx of  . Alcohol abuse   . COPD (chronic obstructive pulmonary disease) ([place])   . 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 Seizures ([place]) 2006  . Complication of anesthesia 08-05-2013    slow to awaken  . Acute pancreatitis     pseudo cyst    Past Surgical History  Procedure Laterality Date  . Foot surgery Left     2 occasions  . Shoulder surgery Right   . Vasectomy    . Bladder leision    . Bunionectomy Left   . Lumbar spine surgery      2 occasions for left L5 radiculopathy  . Cervical spine surgery  08/2010    C5-6-7  . Spinal cord stimulator implant  02/2011  . Esophagogastroduodenoscopy N/A 08/05/2013    Procedure: ESOPHAGOGASTRODUODENOSCOPY (EGD);  Surgeon: Jeryl Columbia, MD;  Location: [facility];  Service: Endoscopy;  Laterality: N/A;  . Squamous cell carcinoma removed  4 weeks ago    upper left arm, heaing well  . Eus N/A 10/23/2013    Procedure: ESOPHAGEAL ENDOSCOPIC ULTRASOUND (EUS) RADIAL;  Surgeon: Arta Silence, MD;  Location: [facility];  Service: Endoscopy;  Laterality: N/A;  . Eye surgery Bilateral 2015    cataract surgery w/ lens implant    Allergies  Allergen Reactions  . Lyrica [Pregabalin] Other (See Comments)    Hallucinations.    .  Sulfonamide Derivatives Other (See Comments)    unknown as a child  . Ambien [Zolpidem] Other (See Comments)    "reverse effect"  . Diovan [Valsartan] Other (See Comments)    Lightheaded,dizziness    Social History  Substance Use Topics  . Smoking status: Current Every Day Smoker -- 1.00 packs/day for 50 years    Types: Cigarettes  . Smokeless tobacco: Never Used     Comment: Contemplating setting a quit date  . Alcohol Use: No     Comment: 07/17/13 was last drink-occasionally    Family History  Problem Relation Age of Onset  . Colon cancer Father   . Heart disease Brother    Prior to Admission medications   Medication Sig Start Date End Date Taking? Authorizing Provider  cloNIDine (CATAPRES) 0.1 MG tablet Take 0.1 mg by mouth daily.   Yes Historical Provider, MD  Cyanocobalamin (VITAMIN B 12 PO) Take 1,000 mcg by mouth daily.   Yes Historical Provider, MD  lamoTRIgine (LAMICTAL) 25 MG tablet TAKE (2) TABLETS TWICE DAILY. 11/02/15  Yes Kathrynn Ducking, MD  levETIRAcetam (KEPPRA) 500 MG tablet TAKE 1 TABLET TWICE DAILY. 12/03/15  Yes Kathrynn Ducking, MD  naproxen sodium (ANAPROX) 220 MG tablet Take 220 mg by mouth once as needed (pain).   Yes Historical Provider, MD     Review  of Systems  Positive ROS: As above  All other systems have been reviewed and were otherwise negative with the exception of those mentioned in the HPI and as above.  Objective: Vital signs in last 24 hours: Temp:  [98 F (36.7 C)] 98 F (36.7 C) (06/21 0744) Pulse Rate:  [62] 62 (06/21 0744) Resp:  [20] 20 (06/21 0744) BP: (133)/(77) 133/77 mmHg (06/21 0744) SpO2:  [98 %] 98 % (06/21 0744) Weight:  [64.864 kg (143 lb)] 64.864 kg (143 lb) (06/21 0744)  General Appearance: Alert, cooperative, no distress, Head: Normocephalic, without obvious abnormality, atraumatic Eyes: PERRL, conjunctiva/corneas clear, EOM's intact,    Ears: Normal  Throat: Normal  Neck: Supple, symmetrical, trachea midline, no  adenopathy; thyroid: No enlargement/tenderness/nodules; no carotid bruit or JVD Back: Symmetric, no curvature, ROM normal, no CVA tenderness Lungs: Clear to auscultation bilaterally, respirations unlabored Heart: Regular rate and rhythm, no murmur, rub or gallop Abdomen: Soft, non-tender,, no masses, no organomegaly Extremities: Extremities normal, atraumatic, no cyanosis or edema Pulses: 2+ and symmetric all extremities Skin: Skin color, texture, turgor normal, no rashes or lesions  NEUROLOGIC:   Mental status: alert and oriented, no aphasia, good attention span, Fund of knowledge/ memory ok Motor Exam - grossly normal Sensory Exam - grossly normal Reflexes:  Coordination - grossly normal Gait - grossly normal Balance - grossly normal Cranial Nerves: I: smell Not tested  II: visual acuity  OS: Normal  OD: Normal   II: visual fields Full to confrontation  II: pupils Equal, round, reactive to light  III,VII: ptosis None  III,IV,VI: extraocular muscles  Full ROM  V: mastication Normal  V: facial light touch sensation  Normal  V,VII: corneal reflex  Present  VII: facial muscle function - upper  Normal  VII: facial muscle function - lower Normal  VIII: hearing Not tested  IX: soft palate elevation  Normal  IX,X: gag reflex Present  XI: trapezius strength  5/5  XI: sternocleidomastoid strength 5/5  XI: neck flexion strength  5/5  XII: tongue strength  Normal    Data Review Lab Results  Component Value Date   WBC 7.7 12/21/2015   HGB 15.5 12/21/2015   HCT 46.1 12/21/2015   MCV 92.0 12/21/2015   PLT 222 12/21/2015   Lab Results  Component Value Date   NA 137 12/21/2015   K 3.9 12/21/2015   CL 107 12/21/2015   CO2 22 12/21/2015   BUN 13 12/21/2015   CREATININE 0.94 12/21/2015   GLUCOSE 102* 12/21/2015   Lab Results  Component Value Date   INR 1.37 08/01/2013    Assessment/Plan: L4-5 spondylolisthesis, spinal stenosis, lumbago, lumbar radiculopathy, neurogenic  claudication: I have discussed the situation with the patient. I have reviewed his imaging studies with him and pointed out the abnormalities. We have discussed the various treatment options including surgery. I have described the surgical treatment option and L4-5 decompression, instrumentation, and fusion. I have shown him surgical models. We have discussed the risks, benefits, alternatives, and likelihood of achieving her goals with surgery. I have answered all the patient's questions. He has decided to proceed with surgery.   Dash Cardarelli D 12/30/2015 10:49 AM

## 2015-12-31 LAB — CBC
HCT: 39.8 % (ref 39.0–52.0)
Hemoglobin: 12.8 g/dL — ABNORMAL LOW (ref 13.0–17.0)
MCH: 30.1 pg (ref 26.0–34.0)
MCHC: 32.2 g/dL (ref 30.0–36.0)
MCV: 93.6 fL (ref 78.0–100.0)
Platelets: 175 10*3/uL (ref 150–400)
RBC: 4.25 MIL/uL (ref 4.22–5.81)
RDW: 13.1 % (ref 11.5–15.5)
WBC: 8.2 10*3/uL (ref 4.0–10.5)

## 2015-12-31 LAB — BASIC METABOLIC PANEL
Anion gap: 4 — ABNORMAL LOW (ref 5–15)
BUN: 8 mg/dL (ref 6–20)
CO2: 25 mmol/L (ref 22–32)
Calcium: 8.2 mg/dL — ABNORMAL LOW (ref 8.9–10.3)
Chloride: 109 mmol/L (ref 101–111)
Creatinine, Ser: 0.9 mg/dL (ref 0.61–1.24)
GFR calc Af Amer: >60 mL/min (ref >60)
GFR calc non Af Amer: >60 mL/min (ref >60)
Glucose, Bld: 106 mg/dL — ABNORMAL HIGH (ref 65–99)
Potassium: 3.7 mmol/L (ref 3.5–5.1)
Sodium: 138 mmol/L (ref 135–145)

## 2015-12-31 MED ORDER — CYCLOBENZAPRINE HCL 10 MG PO TABS: 10.0000 mg | ORAL_TABLET | Freq: Three times a day (TID) | ORAL | Status: DC | PRN

## 2015-12-31 MED ORDER — OXYCODONE-ACETAMINOPHEN 5-325 MG PO TABS: 1.0000 | ORAL_TABLET | ORAL | Status: DC | PRN

## 2015-12-31 MED ORDER — DOCUSATE SODIUM 100 MG PO CAPS: 100.0000 mg | ORAL_CAPSULE | Freq: Two times a day (BID) | ORAL | Status: DC

## 2015-12-31 NOTE — Discharge Summary (Signed)
Physician Discharge Summary  Patient ID: Jeffrey Castillo MRN: [id number] DOB/AGE: March 21, 1951 65 y.o.  Admit date: 12/30/2015 Discharge date: 12/31/2015  Admission Diagnoses:L4-5 spondylolisthesis, foraminal stenosis, lumbago, lumbar radiculopathy, neurogenic claudication  Discharge Diagnoses: The same Active Problems:   Spondylolisthesis of lumbar region   Discharged Condition: good  Hospital Course: I performed an L4-5 decompression, instrumentation, and fusion on the patient on 12/30/2015. The surgery went well.  The patient's postoperative course was unremarkable. On postoperative day #1 the patient requested discharge to home. He was given written and oral discharge instructions. All his questions were answered.  Consults: Physical therapy Significant Diagnostic Studies: None Treatments: L4-5 decompression, instrumentation, and fusion. Discharge Exam: Blood pressure 143/75, pulse 69, temperature 97.9 F (36.6 C), temperature source Oral, resp. rate 18, weight 64.864 kg (143 lb), SpO2 96 %. The patient is alert and pleasant. He looks well. His strength is grossly normal in his lower extremities. He says his leg already feels better. His dressing is clean and dry.  Disposition: Home  Discharge Instructions    Call MD for:  difficulty breathing, headache or visual disturbances    Complete by:  As directed      Call MD for:  extreme fatigue    Complete by:  As directed      Call MD for:  hives    Complete by:  As directed      Call MD for:  persistant dizziness or light-headedness    Complete by:  As directed      Call MD for:  persistant nausea and vomiting    Complete by:  As directed      Call MD for:  redness, tenderness, or signs of infection (pain, swelling, redness, odor or green/yellow discharge around incision site)    Complete by:  As directed      Call MD for:  severe uncontrolled pain    Complete by:  As directed      Call MD for:  temperature >100.4     Complete by:  As directed      Diet - low sodium heart healthy    Complete by:  As directed      Discharge instructions    Complete by:  As directed   Call [phone] for a followup appointment. Take a stool softener while you are using pain medications.     Driving Restrictions    Complete by:  As directed   Do not drive for 2 weeks.     Increase activity slowly    Complete by:  As directed      Lifting restrictions    Complete by:  As directed   Do not lift more than 5 pounds. No excessive bending or twisting.     May shower / Bathe    Complete by:  As directed   He may shower after the pain she is removed 3 days after surgery. Leave the incision alone.     Remove dressing in 48 hours    Complete by:  As directed   Your stitches are under the scan and will dissolve by themselves. The Steri-Strips will fall off after you take a few showers. Do not rub back or pick at the wound, Leave the wound alone.            Medication List    STOP taking these medications        naproxen sodium 220 MG tablet  Commonly known as:  ANAPROX  TAKE these medications        cloNIDine 0.1 MG tablet  Commonly known as:  CATAPRES  Take 0.1 mg by mouth daily.     cyclobenzaprine 10 MG tablet  Commonly known as:  FLEXERIL  Take 1 tablet (10 mg total) by mouth 3 (three) times daily as needed for muscle spasms.     docusate sodium 100 MG capsule  Commonly known as:  COLACE  Take 1 capsule (100 mg total) by mouth 2 (two) times daily.     lamoTRIgine 25 MG tablet  Commonly known as:  LAMICTAL  TAKE (2) TABLETS TWICE DAILY.     levETIRAcetam 500 MG tablet  Commonly known as:  KEPPRA  TAKE 1 TABLET TWICE DAILY.     oxyCODONE-acetaminophen 5-325 MG tablet  Commonly known as:  PERCOCET/ROXICET  Take 1-2 tablets by mouth every 4 (four) hours as needed for moderate pain.     VITAMIN B 12 PO  Take 1,000 mcg by mouth daily.         SignedOphelia Charter 12/31/2015, 9:39  AM

## 2015-12-31 NOTE — Evaluation (Signed)
Physical Therapy Evaluation Patient Details Name: Jeffrey Castillo MRN: [id number] DOB: 18-Sep-1950 Today's Date: 12/31/2015   History of Present Illness  Pt is a 65 y/o male who presents s/p L4-L5 PLIF on 12/30/15.  Clinical Impression  Pt admitted with above diagnosis. Pt currently with functional limitations due to the deficits listed below (see PT Problem List). At the time of PT eval pt was able to perform transfers and ambulation with min guard assist for balance support and safety. Pt appears unsteady with SPC alone, however is refusing RW. Pt was encouraged to use RW at d/c for longer distances. Pt will benefit from skilled PT to increase their independence and safety with mobility to allow discharge to the venue listed below.       Follow Up Recommendations Outpatient PT;Supervision for mobility/OOB    Equipment Recommendations  None recommended by PT    Recommendations for Other Services       Precautions / Restrictions Precautions Precautions: Fall;Back Precaution Booklet Issued: Yes (comment) Precaution Comments: Reviewed handout. Pt was cued for precautions during functional mobility.  Required Braces or Orthoses: Spinal Brace Spinal Brace: Lumbar corset;Applied in sitting position Restrictions Weight Bearing Restrictions: No      Mobility  Bed Mobility               General bed mobility comments: Pt sitting up in the recliner upon PT arrival.   Transfers Overall transfer level: Needs assistance Equipment used: Straight cane Transfers: Sit to/from Stand Sit to Stand: Min guard         General transfer comment: VC's for hand placement on seated surface for safety. Pt was able to power up to full standing with no physical assist required.   Ambulation/Gait Ambulation/Gait assistance: Min guard Ambulation Distance (Feet): 300 Feet Assistive device: Straight cane Gait Pattern/deviations: Step-through pattern;Decreased stride length;Wide base of  support;Trendelenburg;Antalgic Gait velocity: Decreased Gait velocity interpretation: Below normal speed for age/gender General Gait Details: Pt appears fairly unsteady with SPC. Offered RW however pt refused. Balance improved with reach for wall/railing on L side.   Stairs Stairs: Yes Stairs assistance: Min guard Stair Management: One rail Left;Forwards;With cane;Step to pattern Number of Stairs: 2 General stair comments: VC's for sequencing and general safety awareness.   Wheelchair Mobility    Modified Rankin (Stroke Patients Only)       Balance Overall balance assessment: Needs assistance Sitting-balance support: Feet supported;No upper extremity supported Sitting balance-Leahy Scale: Good     Standing balance support: During functional activity;Single extremity supported Standing balance-Leahy Scale: Fair                               Pertinent Vitals/Pain Pain Assessment: Faces Faces Pain Scale: Hurts even more Pain Location: Incision site - increased with brace Pain Descriptors / Indicators: Operative site guarding;Sore Pain Intervention(s): Limited activity within patient's tolerance;Monitored during session;Repositioned    Home Living Family/patient expects to be discharged to:: Private residence Living Arrangements: Spouse/significant other Available Help at Discharge: Family;Available 24 hours/day Type of Home: House Home Access: Stairs to enter Entrance Stairs-Rails: Psychiatric nurse of Steps: 2 Home Layout: Two level;Able to live on main level with bedroom/bathroom Home Equipment: Gilford Rile - 2 wheels;Cane - single point;Shower seat;Grab bars - toilet;Grab bars - tub/shower      Prior Function Level of Independence: Independent with assistive device(s)         Comments: Used cane PTA  Hand Dominance   Dominant Hand: Right    Extremity/Trunk Assessment   Upper Extremity Assessment: Overall WFL for tasks  assessed           Lower Extremity Assessment: Generalized weakness      Cervical / Trunk Assessment: Other exceptions  Communication   Communication: No difficulties  Cognition Arousal/Alertness: Awake/alert Behavior During Therapy: WFL for tasks assessed/performed Overall Cognitive Status: Within Functional Limits for tasks assessed                      General Comments      Exercises        Assessment/Plan    PT Assessment Patient needs continued PT services  PT Diagnosis Difficulty walking;Acute pain   PT Problem List Decreased strength;Decreased activity tolerance;Decreased range of motion;Decreased balance;Decreased mobility;Decreased knowledge of use of DME;Decreased safety awareness;Decreased knowledge of precautions;Pain  PT Treatment Interventions DME instruction;Gait training;Stair training;Functional mobility training;Therapeutic activities;Therapeutic exercise;Neuromuscular re-education;Patient/family education   PT Goals (Current goals can be found in the Care Plan section) Acute Rehab PT Goals Patient Stated Goal: Home tomorrow PT Goal Formulation: With patient Time For Goal Achievement: 01/07/16 Potential to Achieve Goals: Good    Frequency Min 5X/week   Barriers to discharge        Co-evaluation               End of Session Equipment Utilized During Treatment: Back brace Activity Tolerance: Patient limited by fatigue;Patient limited by pain Patient left: with call bell/phone within reach (Sitting EOB for breakfast) Nurse Communication: Mobility status         Time: [id number] PT Time Calculation (min) (ACUTE ONLY): 18 min   Charges:   PT Evaluation $PT Eval Moderate Complexity: 1 Procedure     PT G CodesRolinda Roan 01-28-2016, 9:58 AM   Rolinda Roan, PT, DPT Acute Rehabilitation Services Pager: [phone]

## 2015-12-31 NOTE — Progress Notes (Signed)
Patient alert and oriented, mae's well, voiding adequate amount of urine, swallowing without difficulty, c/o mild pain. Patient discharged home with family. Script and discharged instructions given to patient. Patient and family stated understanding of d/c instructions given and has an appointment with MD.   

## 2016-01-20 DIAGNOSIS — M545 Low back pain: Secondary | ICD-10-CM | POA: Diagnosis not present

## 2016-01-20 DIAGNOSIS — M4806 Spinal stenosis, lumbar region: Secondary | ICD-10-CM | POA: Diagnosis not present

## 2016-02-01 ENCOUNTER — Other Ambulatory Visit: Payer: Self-pay | Admitting: Neurology

## 2016-02-02 ENCOUNTER — Ambulatory Visit: Payer: Medicare Other | Admitting: Neurology

## 2016-02-29 DIAGNOSIS — Z23 Encounter for immunization: Secondary | ICD-10-CM | POA: Diagnosis not present

## 2016-02-29 DIAGNOSIS — E538 Deficiency of other specified B group vitamins: Secondary | ICD-10-CM | POA: Diagnosis not present

## 2016-02-29 DIAGNOSIS — Z0000 Encounter for general adult medical examination without abnormal findings: Secondary | ICD-10-CM | POA: Diagnosis not present

## 2016-02-29 DIAGNOSIS — Z87898 Personal history of other specified conditions: Secondary | ICD-10-CM | POA: Diagnosis not present

## 2016-02-29 DIAGNOSIS — Z1159 Encounter for screening for other viral diseases: Secondary | ICD-10-CM | POA: Diagnosis not present

## 2016-02-29 DIAGNOSIS — R569 Unspecified convulsions: Secondary | ICD-10-CM | POA: Diagnosis not present

## 2016-02-29 DIAGNOSIS — F17210 Nicotine dependence, cigarettes, uncomplicated: Secondary | ICD-10-CM | POA: Diagnosis not present

## 2016-02-29 DIAGNOSIS — Z125 Encounter for screening for malignant neoplasm of prostate: Secondary | ICD-10-CM | POA: Diagnosis not present

## 2016-02-29 DIAGNOSIS — M47816 Spondylosis without myelopathy or radiculopathy, lumbar region: Secondary | ICD-10-CM | POA: Diagnosis not present

## 2016-02-29 DIAGNOSIS — I700 Atherosclerosis of aorta: Secondary | ICD-10-CM | POA: Diagnosis not present

## 2016-02-29 DIAGNOSIS — M810 Age-related osteoporosis without current pathological fracture: Secondary | ICD-10-CM | POA: Diagnosis not present

## 2016-03-17 DIAGNOSIS — M810 Age-related osteoporosis without current pathological fracture: Secondary | ICD-10-CM | POA: Diagnosis not present

## 2016-03-18 DIAGNOSIS — K7030 Alcoholic cirrhosis of liver without ascites: Secondary | ICD-10-CM | POA: Diagnosis not present

## 2016-03-18 DIAGNOSIS — Z882 Allergy status to sulfonamides status: Secondary | ICD-10-CM | POA: Diagnosis not present

## 2016-03-18 DIAGNOSIS — Z79899 Other long term (current) drug therapy: Secondary | ICD-10-CM | POA: Diagnosis not present

## 2016-03-18 DIAGNOSIS — G40909 Epilepsy, unspecified, not intractable, without status epilepticus: Secondary | ICD-10-CM | POA: Diagnosis not present

## 2016-03-18 DIAGNOSIS — K219 Gastro-esophageal reflux disease without esophagitis: Secondary | ICD-10-CM | POA: Diagnosis not present

## 2016-03-18 DIAGNOSIS — K317 Polyp of stomach and duodenum: Secondary | ICD-10-CM | POA: Diagnosis not present

## 2016-03-18 DIAGNOSIS — Z886 Allergy status to analgesic agent status: Secondary | ICD-10-CM | POA: Diagnosis not present

## 2016-03-18 DIAGNOSIS — D132 Benign neoplasm of duodenum: Secondary | ICD-10-CM | POA: Diagnosis not present

## 2016-03-18 DIAGNOSIS — F17210 Nicotine dependence, cigarettes, uncomplicated: Secondary | ICD-10-CM | POA: Diagnosis not present

## 2016-03-18 DIAGNOSIS — Z888 Allergy status to other drugs, medicaments and biological substances status: Secondary | ICD-10-CM | POA: Diagnosis not present

## 2016-03-18 DIAGNOSIS — J449 Chronic obstructive pulmonary disease, unspecified: Secondary | ICD-10-CM | POA: Diagnosis not present

## 2016-03-18 DIAGNOSIS — I10 Essential (primary) hypertension: Secondary | ICD-10-CM | POA: Diagnosis not present

## 2016-03-23 ENCOUNTER — Encounter: Payer: Self-pay | Admitting: Neurology

## 2016-03-23 ENCOUNTER — Ambulatory Visit (INDEPENDENT_AMBULATORY_CARE_PROVIDER_SITE_OTHER): Payer: Medicare Other | Admitting: Neurology

## 2016-03-23 VITALS — BP 156/78 | HR 63 | Ht 67.0 in | Wt 142.0 lb

## 2016-03-23 DIAGNOSIS — G40309 Generalized idiopathic epilepsy and epileptic syndromes, not intractable, without status epilepticus: Secondary | ICD-10-CM

## 2016-03-23 NOTE — Progress Notes (Signed)
Reason for visit: Seizures  NYCERE RENY is an 65 y.o. male  History of present illness:  Mr. Schlesser is a 65 year old right-handed white male with a history of alcohol abuse, and a history of seizures. The patient has done well since last seen, he has not had any recurrence of seizures. He is operating a motor vehicle. He has stopped drinking. He underwent surgery around 12/30/2015 on his low back at the L4-5 level. The patient has had significant benefit with his back pain and left leg discomfort. The patient has a spinal stimulator in place, but he is not using it at this time. The patient reports some gait instability, but no falls since last seen. The patient does also have a mild peripheral neuropathy.  Past Medical History:  Diagnosis Date  . Acute pancreatitis    pseudo cyst  . Alcohol abuse   . Complex regional pain syndrome of left lower extremity   . Complication of anesthesia 08-05-2013   slow to awaken  . COPD (chronic obstructive pulmonary disease) ([place])   . 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 Essential tremor    hx of  . Gait disorder   . HYPERTENSION 07/31/2009  . Memory disorder   . Neuropathy ([facility])    feet  . Osteopenia   . Right humeral fracture   . Seizures ([place]) 2006    Past Surgical History:  Procedure Laterality Date  . bladder leision    . BUNIONECTOMY Left   . CERVICAL SPINE SURGERY  08/2010   C5-6-7  . ESOPHAGOGASTRODUODENOSCOPY N/A 08/05/2013   Procedure: ESOPHAGOGASTRODUODENOSCOPY (EGD);  Surgeon: Jeryl Columbia, MD;  Location: [facility];  Service: Endoscopy;  Laterality: N/A;  . EUS N/A 10/23/2013   Procedure: ESOPHAGEAL ENDOSCOPIC ULTRASOUND (EUS) RADIAL;  Surgeon: Arta Silence, MD;  Location: [facility];  Service: Endoscopy;  Laterality: N/A;  . EYE SURGERY Bilateral 2015   cataract surgery w/ lens implant  . FOOT SURGERY Left    2 occasions  . LUMBAR SPINE SURGERY     2 occasions for left L5 radiculopathy  . SHOULDER SURGERY Right   .  SPINAL CORD STIMULATOR IMPLANT  02/2011  . 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 VASECTOMY      Family History  Problem Relation Age of Onset  . Stroke Mother   . Colon cancer Father   . Heart disease Brother     Social history:  reports that he has been smoking Cigarettes.  He has a 50.00 pack-year smoking history. He has never used smokeless tobacco. He reports that he does not drink alcohol or use drugs.    Allergies  Allergen Reactions  . Lyrica [Pregabalin] Other (See Comments)    Hallucinations.    . Sulfonamide Derivatives Other (See Comments)    unknown as a child  . Ambien [Zolpidem] Other (See Comments)    "reverse effect"  . Diovan [Valsartan] Other (See Comments)    Lightheaded,dizziness    Medications:  Prior to Admission medications   Medication Sig Start Date End Date Taking? Authorizing Provider  lamoTRIgine (LAMICTAL) 25 MG tablet TAKE (2) TABLETS TWICE DAILY. 11/02/15  Yes Kathrynn Ducking, MD  levETIRAcetam (KEPPRA) 500 MG tablet TAKE 1 TABLET TWICE DAILY. 02/02/16  Yes Kathrynn Ducking, MD    ROS:  Out of a complete 14 system review of symptoms, the patient complains only of the following symptoms, and all other reviewed systems  are negative.  History of seizures  Blood pressure (!) 156/78, pulse 63, height 5\' 7"  (1.702 m), weight 142 lb (64.4 kg).  Physical Exam  General: The patient is alert and cooperative at the time of the examination.  Skin: No significant peripheral edema is noted.   Neurologic Exam  Mental status: The patient is alert and oriented x 3 at the time of the examination. The patient has apparent normal recent and remote memory, with an apparently normal attention span and concentration ability.   Cranial nerves: Facial symmetry is present. Speech is normal, no aphasia or dysarthria is noted. Extraocular movements are full. Visual fields are full. At times a head tremor is  seen.  Motor: The patient has good strength in all 4 extremities.  Sensory examination: Soft touch sensation is symmetric on the face, arms, and legs. No definite stocking pattern pinprick sensory deficit is noted in the legs.  Coordination: The patient has good finger-nose-finger and heel-to-shin bilaterally.  Gait and station: The patient has a normal gait. Tandem gait is slightly unsteady. Romberg is negative, but is unsteady. No drift is seen.  Reflexes: Deep tendon reflexes are symmetric, but are depressed.   Assessment/Plan:  1. History of seizures  2. History of alcohol abuse  3. Chronic low back pain, improved following surgery  The patient is doing much better at this time. We will continue the Keppra and Lamictal, the patient is back to driving at this point and he claims that he is no longer drinking alcohol. The patient will follow-up in 6 months, sooner if needed. If he continues to do well, he can be followed once a year.  Jill Alexanders MD 03/23/2016 9:36 AM  [facility] [address] [place], [place] [address]  Phone [phone] Fax [phone]

## 2016-04-04 ENCOUNTER — Other Ambulatory Visit: Payer: Self-pay | Admitting: Neurology

## 2016-05-02 ENCOUNTER — Other Ambulatory Visit: Payer: Self-pay | Admitting: Neurology

## 2016-05-02 ENCOUNTER — Ambulatory Visit (INDEPENDENT_AMBULATORY_CARE_PROVIDER_SITE_OTHER)
Admission: RE | Admit: 2016-05-02 | Discharge: 2016-05-02 | Disposition: A | Discharge status: Home / Self Care | Payer: Medicare Other | Source: Ambulatory Visit | Attending: Acute Care | Admitting: Acute Care

## 2016-05-02 DIAGNOSIS — Z87891 Personal history of nicotine dependence: Secondary | ICD-10-CM | POA: Diagnosis not present

## 2016-05-02 DIAGNOSIS — F17210 Nicotine dependence, cigarettes, uncomplicated: Secondary | ICD-10-CM

## 2016-05-03 ENCOUNTER — Telehealth: Payer: Self-pay | Admitting: Acute Care

## 2016-05-03 DIAGNOSIS — F17210 Nicotine dependence, cigarettes, uncomplicated: Secondary | ICD-10-CM

## 2016-05-03 NOTE — Telephone Encounter (Signed)
I got in touch with Jeffrey Castillo regarding the results of his scan when I called his wife's results. I explained to him that his scan was read as a Lung RADS 2: nodules that are benign in appearance and behavior with a very low likelihood of becoming a clinically active cancer due to size or lack of growth. Recommendation per radiology is for a repeat LDCT in 12 months. I explained that we will order and schedule the scan for October 2018. 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 He states that this is not a new finding, and that he is aware of it. He is currently not taking a statin however has cholesterol checked annually by his primary care provider. I explained to him that I would send a copy of his results to Jeffrey Castillo, his primary care provider. Jeffrey Castillo verbalized understanding of the above and had no further questions. He has my contact information in the event he has questions in the future.

## 2016-05-03 NOTE — Telephone Encounter (Signed)
I attempted to call Jeffrey Castillo with the results of his low dose screening CT Chest. There was no answer. I left a message requesting that he return my call for his results with my contact information. I will await his return call.

## 2016-05-06 DIAGNOSIS — M5136 Other intervertebral disc degeneration, lumbar region: Secondary | ICD-10-CM | POA: Diagnosis not present

## 2016-05-06 DIAGNOSIS — M545 Low back pain: Secondary | ICD-10-CM | POA: Diagnosis not present

## 2016-05-10 ENCOUNTER — Ambulatory Visit: Payer: Medicare Other | Admitting: Nurse Practitioner

## 2016-09-13 DIAGNOSIS — M4316 Spondylolisthesis, lumbar region: Secondary | ICD-10-CM | POA: Diagnosis not present

## 2016-09-13 DIAGNOSIS — R030 Elevated blood-pressure reading, without diagnosis of hypertension: Secondary | ICD-10-CM | POA: Diagnosis not present

## 2016-09-15 ENCOUNTER — Encounter: Payer: Self-pay | Admitting: Nurse Practitioner

## 2016-09-15 ENCOUNTER — Ambulatory Visit (INDEPENDENT_AMBULATORY_CARE_PROVIDER_SITE_OTHER): Payer: Medicare Other | Admitting: Nurse Practitioner

## 2016-09-15 VITALS — BP 169/84 | HR 75 | Wt 144.0 lb

## 2016-09-15 DIAGNOSIS — G40309 Generalized idiopathic epilepsy and epileptic syndromes, not intractable, without status epilepticus: Secondary | ICD-10-CM | POA: Diagnosis not present

## 2016-09-15 DIAGNOSIS — R269 Unspecified abnormalities of gait and mobility: Secondary | ICD-10-CM | POA: Diagnosis not present

## 2016-09-15 MED ORDER — LAMOTRIGINE 25 MG PO TABS
ORAL_TABLET | ORAL | 3 refills | Status: DC
Start: 2016-09-15 — End: 2017-09-18

## 2016-09-15 NOTE — Patient Instructions (Signed)
Continue Keppra 500 mg twice daily Continue Lamictal 25 mg 2 tablets twice daily will refill Call for any seizure activity Follow-up yearly and when necessary 

## 2016-09-15 NOTE — Progress Notes (Signed)
I have read the note, and I agree with the clinical assessment and plan.  Ashly Yepez KEITH   

## 2016-09-15 NOTE — Progress Notes (Signed)
[facility]  PATIENT: Jeffrey Castillo DOB: Sep 12, 1950   REASON FOR VISIT: Follow-up for seizure disorder HISTORY FROM: Patient    HISTORY OF PRESENT ILLNESS:UPDATE 03/08/2018[name] 66 year old male returns for follow-up with history of seizure disorder and a history of alcohol abuse in the past. He has not had any seizures in over a year and is currently well-controlled on Lamictal and Keppra without side effects. He is operating a motor vehicle without difficulty. He claims he no longer drinks alcohol. The patient has a spinal stimulator in place but he is currently not being used. The patient denies any falls but does have gait instability and a mild peripheral neuropathy. He returns for reevaluation and refills  REVIEW OF SYSTEMS: Full 14 system review of systems performed and notable only for those listed, all others are neg:  Constitutional: neg  Cardiovascular: neg Ear/Nose/Throat: neg  Skin: neg Eyes: neg Respiratory: neg Gastroitestinal: neg  Hematology/Lymphatic: neg  Endocrine: neg Musculoskeletal:neg Allergy/Immunology: neg Neurological: Numbness Psychiatric: neg Sleep : neg   ALLERGIES: Allergies  Allergen Reactions  . Lyrica [Pregabalin] Other (See Comments)    Hallucinations.    . Sulfonamide Derivatives Other (See Comments)    unknown as a child  . Ambien [Zolpidem] Other (See Comments)    "reverse effect"  . Diovan [Valsartan] Other (See Comments)    Lightheaded,dizziness    HOME MEDICATIONS: Outpatient Medications Prior to Visit  Medication Sig Dispense Refill  . lamoTRIgine (LAMICTAL) 25 MG tablet TAKE (2) TABLETS TWICE DAILY. 120 tablet 5  . levETIRAcetam (KEPPRA) 500 MG tablet TAKE 1 TABLET TWICE DAILY. 60 tablet 11   No facility-administered medications prior to visit.     PAST MEDICAL HISTORY: Past Medical History:  Diagnosis Date  . Acute pancreatitis    pseudo cyst  . Alcohol abuse   . Complex regional pain  syndrome of left lower extremity   . Complication of anesthesia 08-05-2013   slow to awaken  . COPD (chronic obstructive pulmonary disease) ([place])   . 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 Essential tremor    hx of  . Gait disorder   . HYPERTENSION 07/31/2009  . Memory disorder   . Neuropathy ([facility])    feet  . Osteopenia   . Right humeral fracture   . Seizures ([place]) 2006    PAST SURGICAL HISTORY: Past Surgical History:  Procedure Laterality Date  . bladder leision    . BUNIONECTOMY Left   . CERVICAL SPINE SURGERY  08/2010   C5-6-7  . ESOPHAGOGASTRODUODENOSCOPY N/A 08/05/2013   Procedure: ESOPHAGOGASTRODUODENOSCOPY (EGD);  Surgeon: Jeryl Columbia, MD;  Location: [facility];  Service: Endoscopy;  Laterality: N/A;  . EUS N/A 10/23/2013   Procedure: ESOPHAGEAL ENDOSCOPIC ULTRASOUND (EUS) RADIAL;  Surgeon: Arta Silence, MD;  Location: [facility];  Service: Endoscopy;  Laterality: N/A;  . EYE SURGERY Bilateral 2015   cataract surgery w/ lens implant  . FOOT SURGERY Left    2 occasions  . LUMBAR SPINE SURGERY     2 occasions for left L5 radiculopathy  . SHOULDER SURGERY Right   . SPINAL CORD STIMULATOR IMPLANT  02/2011  . 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 VASECTOMY      FAMILY HISTORY: Family History  Problem Relation Age of Onset  . Stroke Mother   . Colon cancer Father   . Heart disease Brother     SOCIAL HISTORY: Social History   Social  History  . Marital status: Married    Spouse name: Rosemarie Ax   . Number of children: 5  . Years of education: Masters   Occupational History  . Retired    Social History Main Topics  . Smoking status: Current Every Day Smoker    Packs/day: 1.00    Years: 50.00    Types: Cigarettes  . Smokeless tobacco: Never Used     Comment: Contemplating setting a quit date  . Alcohol use No     Comment: Hx ov alcoholism  . Drug use: No  . Sexual activity: No   Other Topics Concern  . Not on file    Social History Narrative   Patient lives at home with wife Rosemarie Ax.    Patient has 5 children.    Patient has a Scientist, water quality.    Patient is currently on disability, now retired.   Patient is right handed.    Patient drinks 1-1.5 cups of coffee daily.      PHYSICAL EXAM  Vitals:   09/15/16 0903  BP: (!) 169/84  Pulse: 75  Weight: 144 lb (65.3 kg)   Body mass index is 22.55 kg/m.  Generalized: Well developed, in no acute distress  Head: normocephalic and atraumatic,. Oropharynx benign  Neck: Supple, no carotid bruits  Musculoskeletal: No deformity   Neurological examination   Mentation: Alert oriented to time, place, history taking. Attention span and concentration appropriate. Recent and remote memory intact.  Follows all commands speech and language fluent.   Cranial nerve II-XII: .Pupils were equal round reactive to light extraocular movements were full, visual field were full on confrontational test. Facial sensation and strength were normal. hearing was intact to finger rubbing bilaterally. Uvula tongue midline. head turning and shoulder shrug were normal and symmetric.Tongue protrusion into cheek strength was normal. Motor: normal bulk and tone, full strength in the BUE, BLE, fine finger movements normal, no pronator drift. Sensory: normal and symmetric to light touch, pinprick, In the upper and lower extremities, decreased   Vibration, in the ankles  Coordination: finger-nose-finger, heel-to-shin bilaterally, no dysmetria Reflexes: Depressed upper and lower plantar responses were flexor bilaterally. Gait and Station: Rising up from seated position without assistance, wide based  stance,  moderate stride, good arm swing, smooth turning, able to perform tiptoe, and heel walking without difficulty. Tandem gait is unsteady, Romberg is negative but unsteady, no drift is seen  DIAGNOSTIC DATA (LABS, IMAGING, TESTING) - I reviewed patient records, labs, notes, testing and  imaging myself where available.  Lab Results  Component Value Date   WBC 8.2 12/31/2015   HGB 12.8 (L) 12/31/2015   HCT 39.8 12/31/2015   MCV 93.6 12/31/2015   PLT 175 12/31/2015      Component Value Date/Time   NA 138 12/31/2015 0423   K 3.7 12/31/2015 0423   CL 109 12/31/2015 0423   CO2 25 12/31/2015 0423   GLUCOSE 106 (H) 12/31/2015 0423   BUN 8 12/31/2015 0423   CREATININE 0.90 12/31/2015 0423   CALCIUM 8.2 (L) 12/31/2015 0423   PROT 6.5 10/15/2013 1330   ALBUMIN 3.5 10/15/2013 1330   AST 14 10/15/2013 1330   ALT 7 10/15/2013 1330   ALKPHOS 79 10/15/2013 1330   BILITOT 0.2 (L) 10/15/2013 1330   GFRNONAA >60 12/31/2015 0423   GFRAA >60 12/31/2015 0423        ASSESSMENT AND PLAN  66 y.o. year old male  has a past medical history of Acute pancreatitis; Alcohol abuse; Complex  regional pain syndrome of left lower extremity; ; Gait disorder; HYPERTENSION (07/31/2009);  Neuropathy ([place]); and Seizures ([place]) (2006). here To follow-up for his seizure disorder. His chronic back pain improved following surgery last year    Continue Keppra 500 mg twice daily Continue Lamictal 25 mg 2 tablets twice daily will refill Call for any seizure activity Follow-up yearly and when necessary Please remember, common seizure triggers are: Sleep deprivation, dehydration, overheating, stress, hypoglycemia or skipping meals, certain medications or excessive alcohol use, especially stopping alcohol abruptly if you have had heavy alcohol use before (aka alcohol withdrawal seizure). If you have a prolonged seizure over 2-5 minutes or back to back seizures, call or have someone call 911 or take you to the nearest emergency room.  Take your medicine for seizure prevention regularly and do not skip doses or stop medication abruptly. Avoid taking Wellbutrin, narcotic pain medications and tramadol, as they can lower seizure threshold.  I spent 15 min  in total face to face time with the patient more than  50% of which was spent counseling and coordination of care, reviewing test results reviewing medications and discussing and reviewing the diagnosis of seizure disorder and common triggers Jeffrey Castillo, [facility], [facility], APRN  [facility] [address] [place], [place] [address] [phone]

## 2016-11-17 ENCOUNTER — Emergency Department (HOSPITAL_COMMUNITY): Payer: Medicare Other

## 2016-11-17 ENCOUNTER — Emergency Department (HOSPITAL_COMMUNITY)
Admission: EM | Admit: 2016-11-17 | Discharge: 2016-11-18 | Disposition: A | Discharge status: Home / Self Care | Payer: Medicare Other | Attending: Emergency Medicine | Admitting: Emergency Medicine

## 2016-11-17 ENCOUNTER — Encounter (HOSPITAL_COMMUNITY): Payer: Self-pay | Admitting: Emergency Medicine

## 2016-11-17 DIAGNOSIS — M79672 Pain in left foot: Secondary | ICD-10-CM

## 2016-11-17 DIAGNOSIS — S99922A Unspecified injury of left foot, initial encounter: Secondary | ICD-10-CM | POA: Diagnosis not present

## 2016-11-17 DIAGNOSIS — Y929 Unspecified place or not applicable: Secondary | ICD-10-CM | POA: Insufficient documentation

## 2016-11-17 DIAGNOSIS — F17210 Nicotine dependence, cigarettes, uncomplicated: Secondary | ICD-10-CM | POA: Diagnosis not present

## 2016-11-17 DIAGNOSIS — J449 Chronic obstructive pulmonary disease, unspecified: Secondary | ICD-10-CM | POA: Diagnosis not present

## 2016-11-17 DIAGNOSIS — Y9301 Activity, walking, marching and hiking: Secondary | ICD-10-CM | POA: Insufficient documentation

## 2016-11-17 DIAGNOSIS — I10 Essential (primary) hypertension: Secondary | ICD-10-CM | POA: Insufficient documentation

## 2016-11-17 DIAGNOSIS — W19XXXA Unspecified fall, initial encounter: Secondary | ICD-10-CM

## 2016-11-17 DIAGNOSIS — Y939 Activity, unspecified: Secondary | ICD-10-CM | POA: Insufficient documentation

## 2016-11-17 DIAGNOSIS — X501XXA Overexertion from prolonged static or awkward postures, initial encounter: Secondary | ICD-10-CM | POA: Insufficient documentation

## 2016-11-17 DIAGNOSIS — Z79899 Other long term (current) drug therapy: Secondary | ICD-10-CM | POA: Diagnosis not present

## 2016-11-17 DIAGNOSIS — Y999 Unspecified external cause status: Secondary | ICD-10-CM | POA: Insufficient documentation

## 2016-11-17 NOTE — ED Notes (Signed)
Called for triage x1.

## 2016-11-17 NOTE — ED Triage Notes (Signed)
Pt comes in with complaints of a fall at a restaurant earlier.  Hx of neuropathy.  Complains of left foot pain and thinks he may have broken it. Denies LOC or hitting head.  Does not take blood thinners. Drank a few alcoholic drinks at dinner. A&O x4.

## 2016-11-18 DIAGNOSIS — M79672 Pain in left foot: Secondary | ICD-10-CM | POA: Diagnosis not present

## 2016-11-18 MED ORDER — NAPROXEN 375 MG PO TABS: 375.0000 mg | ORAL_TABLET | Freq: Two times a day (BID) | ORAL | 0 refills | Status: DC

## 2016-11-18 NOTE — ED Provider Notes (Signed)
[facility] DEPT Provider Note   CSN: [id number] Arrival date & time: 11/17/16  2227   By signing my name below, I, Eunice Blase, attest that this documentation has been prepared under the direction and in the presence of Charlann Lange, PA-C. Electronically Signed: Eunice Blase, Scribe. 11/18/16. 12:23 AM.   History   Chief Complaint Chief Complaint  Patient presents with  . Fall  . Foot Pain   The history is provided by the patient and medical records. No language interpreter was used.    Jeffrey Castillo is a 66 y.o. male with h/o CRPS, multiple LLE surgeries to the foot and ankle, and neuropathy who presents to the Emergency Department with concern for gradually improving, acute on chronic L foot pain s/p a witnessed mechanical fall that occurred at a restaurant last night. No head trauma/ LOC noted. He states his L ankle twisted unexpectedly while he was walking leading to his fall, and he states chronic L foot pain was exacerbated by this fall. 3/10, constant, aching pain reported, worse with walking and movement. Mild relief noted with application of ice in [facility] ED. No other complaints at this time. ETOH use noted tonight at dinner last night. Last surgery performed by Dr. Percell Miller to the L ankle; prior surgery to the same foot performed by another orthopedist. Pt reportedly traveled recently and walked more than he usually does during his trip. No anticoagulant use noted. No other complaints at this time.   Past Medical History:  Diagnosis Date  . Acute pancreatitis    pseudo cyst  . Alcohol abuse   . Complex regional pain syndrome of left lower extremity   . Complication of anesthesia 08-05-2013   slow to awaken  . COPD (chronic obstructive pulmonary disease) ([place])   . 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 Essential tremor    hx of  . Gait disorder   . HYPERTENSION 07/31/2009  . Memory disorder   . Neuropathy    feet  . Osteopenia   . Right humeral fracture   . Seizures ([place])  2006    Patient Active Problem List   Diagnosis Date Noted  . Spondylolisthesis of lumbar region 12/30/2015  . Alcoholic cirrhosis ([place]) 59/93/5701  . Hypokalemia 09/07/2013  . 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 Acute pancreatitis 09/04/2013  . Pancreatic pseudocyst 09/04/2013  . Gastritis 09/04/2013  . Tobacco abuse 09/04/2013  . Chronic pain in left foot 08/01/2013  . Intra-abdominal abscess ([place]) 08/01/2013  . Alcohol abuse 07/18/2013  . Suspected Duodenal ulcer perforation 07/17/2013  . Memory loss 01/30/2013  . Unspecified hereditary and idiopathic peripheral neuropathy 09/11/2012  . Encounter for therapeutic drug monitoring 09/11/2012  . Spinal stenosis in cervical region 09/11/2012  . Nonspecific abnormal toxicological findings 09/11/2012  . Unspecified urinary incontinence 09/11/2012  . Nonspecific elevation of levels of transaminase or lactic acid dehydrogenase (LDH) 09/11/2012  . Abnormality of gait 09/11/2012  . Thoracic or lumbosacral neuritis or radiculitis, unspecified 09/11/2012  . 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 C O P D 08/03/2009  . COUGH 08/03/2009  . 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 HYPERLIPIDEMIA 07/31/2009  . HYPERTENSION 07/31/2009  . SEIZURE DISORDER, HX OF 07/31/2009  . COLONIC POLYPS, ADENOMATOUS, HX OF 07/31/2009  . FATTY LIVER DISEASE, HX OF 07/31/2009  . HEMATURIA, HX OF 07/31/2009    Past Surgical History:  Procedure Laterality Date  . bladder leision    . BUNIONECTOMY Left   . CERVICAL SPINE SURGERY  08/2010   C5-6-7  .  ESOPHAGOGASTRODUODENOSCOPY N/A 08/05/2013   Procedure: ESOPHAGOGASTRODUODENOSCOPY (EGD);  Surgeon: Jeryl Columbia, MD;  Location: [facility];  Service: Endoscopy;  Laterality: N/A;  . EUS N/A 10/23/2013   Procedure: ESOPHAGEAL ENDOSCOPIC ULTRASOUND (EUS) RADIAL;  Surgeon: Arta Silence, MD;  Location: [facility];  Service: Endoscopy;  Laterality: N/A;  . EYE SURGERY Bilateral 2015    cataract surgery w/ lens implant  . FOOT SURGERY Left    2 occasions  . LUMBAR SPINE SURGERY     2 occasions for left L5 radiculopathy  . SHOULDER SURGERY Right   . SPINAL CORD STIMULATOR IMPLANT  02/2011  . 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 VASECTOMY         Home Medications    Prior to Admission medications   Medication Sig Start Date End Date Taking? Authorizing Provider  lamoTRIgine (LAMICTAL) 25 MG tablet TAKE (2) TABLETS TWICE DAILY. 09/15/16   Dennie Bible, NP  levETIRAcetam (KEPPRA) 500 MG tablet TAKE 1 TABLET TWICE DAILY. 05/02/16   Kathrynn Ducking, MD    Family History Family History  Problem Relation Age of Onset  . Stroke Mother   . Colon cancer Father   . Heart disease Brother     Social History Social History  Substance Use Topics  . Smoking status: Current Every Day Smoker    Packs/day: 1.00    Years: 50.00    Types: Cigarettes  . Smokeless tobacco: Never Used     Comment: Contemplating setting a quit date  . Alcohol use No     Comment: Hx ov alcoholism     Allergies   Lyrica [pregabalin]; Sulfonamide derivatives; Ambien [zolpidem]; and Diovan [valsartan]   Review of Systems Review of Systems  Constitutional: Positive for activity change.  HENT: Negative for facial swelling.   Cardiovascular: Negative for leg swelling.  Musculoskeletal: Positive for arthralgias. Negative for joint swelling.  Skin: Negative for color change and wound.  Neurological: Negative for dizziness, weakness, light-headedness, numbness and headaches.  Hematological: Does not bruise/bleed easily.     Physical Exam Updated Vital Signs BP (!) 149/69 (BP Location: Right Arm)   Pulse 66   Temp 97.6 F (36.4 C) (Oral)   Resp 18   Ht 5\' 7"  (1.702 m)   Wt 140 lb (63.5 kg)   SpO2 95%   BMI 21.93 kg/m   Physical Exam  Constitutional: He is oriented to person, place, and time. He appears well-developed and  well-nourished.  HENT:  Head: Normocephalic.  Eyes: EOM are normal.  Neck: Normal range of motion.  Pulmonary/Chest: Effort normal.  Abdominal: He exhibits no distension.  Musculoskeletal: Normal range of motion. He exhibits tenderness.  L foot: no swelling of the ankle or foot, no bony deformity, joint stable, pulses present, FROM with full strength  Neurological: He is alert and oriented to person, place, and time.  Psychiatric: He has a normal mood and affect.  Nursing note and vitals reviewed.    ED Treatments / Results  DIAGNOSTIC STUDIES: Oxygen Saturation is 95% on RA, adequate by my interpretation.    COORDINATION OF CARE: 12:15 AM-Discussed next steps with pt. Pt verbalized understanding and is agreeable with the plan. Will order a splint.   Labs (all labs ordered are listed, but only abnormal results are displayed) Labs Reviewed - No data to display  EKG  EKG Interpretation None       Radiology Dg Foot Complete Left  Result Date: 11/17/2016 CLINICAL DATA:  Fall at [employer] with foot injury. Left foot pain. EXAM: LEFT FOOT - COMPLETE 3+ VIEW COMPARISON:  None. FINDINGS: Screws traverse the second and third metatarsal heads. Plate and multi screw fixation traverse remote distal fibular fracture. The visualized hardware is intact. No acute fracture or dislocation. 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 There is loss of normal Boehler's angle with pes plana, though these are not weight-bearing views. No focal soft tissue abnormality. IMPRESSION: No acute fracture or subluxation of the left foot. Chronic degenerative change at the first and fifth metatarsal phalangeal joints. Pes planus. Electronically Signed   By: Jeb Levering M.D.   On: 11/17/2016 23:53    Procedures Procedures (including critical care time)  Medications Ordered in ED Medications - No data to  display   Initial Impression / Assessment and Plan / ED Course  I have reviewed the triage vital signs and the nursing notes.  Pertinent labs & imaging results that were available during my care of the patient were reviewed by me and considered in my medical decision making (see chart for details).     Patient with complaint of foot injury after mechanical fall. He has had some soreness in the foot since going to [facility] and walking more than usual. History of multiple foot fractures requiring surgery with hardware placement. Imaging negative for acute injury.   ASO splint provided. He was a walker at home and a cane as well to help with ambulation. He is examined by Dr. Roxanne Mins and felt appropriate for discharge home with prn orthopedic follow up.  Final Clinical Impressions(s) / ED Diagnoses   Final diagnoses:  None   1. Left foot pain 2. Fall  New Prescriptions New Prescriptions   No medications on file  I personally performed the services described in this documentation, which was scribed in my presence. The recorded information has been reviewed and is accurate.      Charlann Lange, PA-C 92/33/00 [id number]    Delora Fuel, MD 63/33/54 [phone]

## 2016-11-18 NOTE — Discharge Instructions (Signed)
See your orthopedist if pain continues without improvement.

## 2016-11-25 DIAGNOSIS — S8262XD Displaced fracture of lateral malleolus of left fibula, subsequent encounter for closed fracture with routine healing: Secondary | ICD-10-CM | POA: Diagnosis not present

## 2016-12-02 DIAGNOSIS — Z961 Presence of intraocular lens: Secondary | ICD-10-CM | POA: Diagnosis not present

## 2016-12-12 ENCOUNTER — Telehealth: Payer: Self-pay | Admitting: Nurse Practitioner

## 2016-12-12 NOTE — Telephone Encounter (Signed)
Spoke to pt and he will call Dr. Arnoldo Morale, he feels like his back, with L 5 is causing the problem.  Will call them. He felt like pcp would refer to refer him out.

## 2016-12-12 NOTE — Telephone Encounter (Signed)
He would need to see ortho or  PCP for this

## 2016-12-12 NOTE — Telephone Encounter (Signed)
Patient calling. He has not been able to raise his toes on his left foot x 2 days.

## 2016-12-12 NOTE — Telephone Encounter (Signed)
Spoke to pt.  He had fall and hurt L foot 11-18-16, no fx.  Saw ortho, Dr. Percell Miller.  Since that time he now has issue with 3 days of not being able to move toes on his L foot.  We see for sz primary, has mild PN.  Taking aleve, notes swelling which is normal for him.  ? See pcp or ortho?

## 2016-12-30 DIAGNOSIS — R030 Elevated blood-pressure reading, without diagnosis of hypertension: Secondary | ICD-10-CM | POA: Diagnosis not present

## 2016-12-30 DIAGNOSIS — M21372 Foot drop, left foot: Secondary | ICD-10-CM | POA: Diagnosis not present

## 2017-01-09 ENCOUNTER — Other Ambulatory Visit: Payer: Self-pay | Admitting: Neurosurgery

## 2017-01-09 DIAGNOSIS — M21372 Foot drop, left foot: Secondary | ICD-10-CM

## 2017-01-17 DIAGNOSIS — R29898 Other symptoms and signs involving the musculoskeletal system: Secondary | ICD-10-CM | POA: Diagnosis not present

## 2017-01-17 DIAGNOSIS — M5416 Radiculopathy, lumbar region: Secondary | ICD-10-CM | POA: Diagnosis not present

## 2017-01-17 DIAGNOSIS — R200 Anesthesia of skin: Secondary | ICD-10-CM | POA: Diagnosis not present

## 2017-01-25 ENCOUNTER — Ambulatory Visit
Admission: RE | Admit: 2017-01-25 | Discharge: 2017-01-25 | Disposition: A | Discharge status: Home / Self Care | Payer: Medicare Other | Source: Ambulatory Visit | Attending: Neurosurgery | Admitting: Neurosurgery

## 2017-01-25 VITALS — BP 147/69 | HR 88

## 2017-01-25 DIAGNOSIS — M4316 Spondylolisthesis, lumbar region: Secondary | ICD-10-CM

## 2017-01-25 DIAGNOSIS — M21372 Foot drop, left foot: Secondary | ICD-10-CM

## 2017-01-25 DIAGNOSIS — M48061 Spinal stenosis, lumbar region without neurogenic claudication: Secondary | ICD-10-CM | POA: Diagnosis not present

## 2017-01-25 MED ORDER — DIAZEPAM 5 MG PO TABS
5.0000 mg | ORAL_TABLET | Freq: Once | ORAL | Status: AC
  Administered 2017-01-25: 5 mg via ORAL

## 2017-01-25 MED ORDER — IOPAMIDOL (ISOVUE-M 200) INJECTION 41%
15.0000 mL | Freq: Once | INTRAMUSCULAR | Status: AC
  Administered 2017-01-25: 15 mL via INTRATHECAL

## 2017-01-25 NOTE — Discharge Instructions (Signed)

## 2017-02-03 DIAGNOSIS — M5127 Other intervertebral disc displacement, lumbosacral region: Secondary | ICD-10-CM | POA: Diagnosis not present

## 2017-02-03 DIAGNOSIS — R030 Elevated blood-pressure reading, without diagnosis of hypertension: Secondary | ICD-10-CM | POA: Diagnosis not present

## 2017-02-09 ENCOUNTER — Other Ambulatory Visit: Payer: Self-pay | Admitting: Neurosurgery

## 2017-02-26 DIAGNOSIS — R569 Unspecified convulsions: Secondary | ICD-10-CM | POA: Diagnosis not present

## 2017-02-27 ENCOUNTER — Telehealth: Payer: Self-pay | Admitting: Neurology

## 2017-02-27 MED ORDER — CLONAZEPAM 0.5 MG PO TABS: ORAL_TABLET | ORAL | 0 refills | Status: DC

## 2017-02-27 NOTE — Addendum Note (Signed)
Addended by: Kathrynn Ducking on: 02/27/2017 11:26 AM   Modules accepted: Orders

## 2017-02-27 NOTE — Telephone Encounter (Signed)
Patient's wife Rosemarie Ax (listed on DPR) called office in reference to patient going to [facility] yesterday after having a seizure lasting 2-3 minutes due to alcohol decrease.  Per wife patient had not drank in 2 days causing the seizure, he is home and doing a little better.  Patient was given Adavan(with drawl medication- name wife wrote down) at hospital, but wasn't given any to go home wife. FYI.  Patient has pre-op tomorrow for back surgery and wife is afraid they will postpone due to seizure and alcohol consumption.  Claudia did contact Dr.Jenkin's office to make them aware of recent ED visit.  Please call wife she states she would like all the information she can get.

## 2017-02-27 NOTE — Telephone Encounter (Signed)
I called the wife. The patient had a seizure over the weekend, he had an averaging a fifth of alcohol daily. The patient had cut back on his alcohol intake and then had a seizure.  The patient is on Keppra and Lamictal, but the treatment of alcohol withdrawal seizures is to stop drinking alcohol.  The patient will be given clonazepam taking 0.5 mg twice daily for 3 days, then go to 1 tablet daily for 3 days, then stop the medication.

## 2017-02-27 NOTE — Telephone Encounter (Signed)
Faxed printed/signed rx clonazepam to pt pharmacy. Fax: [phone]. Received confirmation.

## 2017-02-28 ENCOUNTER — Encounter (HOSPITAL_COMMUNITY)
Admission: RE | Admit: 2017-02-28 | Discharge: 2017-02-28 | Disposition: A | Discharge status: Home / Self Care | Payer: Medicare Other | Source: Ambulatory Visit | Attending: Neurosurgery | Admitting: Neurosurgery

## 2017-02-28 ENCOUNTER — Encounter (HOSPITAL_COMMUNITY): Payer: Self-pay | Admitting: Vascular Surgery

## 2017-02-28 ENCOUNTER — Encounter (HOSPITAL_COMMUNITY): Payer: Self-pay

## 2017-02-28 DIAGNOSIS — M48062 Spinal stenosis, lumbar region with neurogenic claudication: Secondary | ICD-10-CM | POA: Diagnosis not present

## 2017-02-28 DIAGNOSIS — Z01818 Encounter for other preprocedural examination: Secondary | ICD-10-CM | POA: Diagnosis not present

## 2017-02-28 DIAGNOSIS — I10 Essential (primary) hypertension: Secondary | ICD-10-CM | POA: Insufficient documentation

## 2017-02-28 HISTORY — DX: Dyspnea, unspecified: R06.00

## 2017-02-28 HISTORY — DX: Unspecified osteoarthritis, unspecified site: M19.90

## 2017-02-28 HISTORY — DX: Personal history of urinary calculi: Z87.442

## 2017-02-28 HISTORY — DX: Anxiety disorder, unspecified: F41.9

## 2017-02-28 LAB — COMPREHENSIVE METABOLIC PANEL
ALT: 60 U/L (ref 17–63)
AST: 101 U/L — ABNORMAL HIGH (ref 15–41)
Albumin: 3.9 g/dL (ref 3.5–5.0)
Alkaline Phosphatase: 85 U/L (ref 38–126)
Anion gap: 13 (ref 5–15)
BUN: 10 mg/dL (ref 6–20)
CO2: 28 mmol/L (ref 22–32)
Calcium: 9 mg/dL (ref 8.9–10.3)
Chloride: 98 mmol/L — ABNORMAL LOW (ref 101–111)
Creatinine, Ser: 0.97 mg/dL (ref 0.61–1.24)
GFR calc Af Amer: >60 mL/min (ref >60)
GFR calc non Af Amer: >60 mL/min (ref >60)
Glucose, Bld: 92 mg/dL (ref 65–99)
Potassium: 2.6 mmol/L — CL (ref 3.5–5.1)
Sodium: 139 mmol/L (ref 135–145)
Total Bilirubin: 1.6 mg/dL — ABNORMAL HIGH (ref 0.3–1.2)
Total Protein: 6.8 g/dL (ref 6.5–8.1)

## 2017-02-28 LAB — APTT: aPTT: 36 seconds (ref 24–36)

## 2017-02-28 LAB — SURGICAL PCR SCREEN
MRSA, PCR: NEGATIVE
Staphylococcus aureus: NEGATIVE

## 2017-02-28 LAB — PROTIME-INR
INR: 0.95
Prothrombin Time: 12.7 seconds (ref 11.4–15.2)

## 2017-02-28 LAB — TYPE AND SCREEN
ABO/RH(D): O POS
Antibody Screen: NEGATIVE

## 2017-02-28 NOTE — Pre-Procedure Instructions (Signed)
PACEN WATFORD  02/28/2017      [address], [place] [address] Phone: [phone] Fax: [phone]    Your procedure is scheduled on Wednesday, August 23 .  Report to [facility] Admitting at 9:50 AM                    Your surgery or procedure is scheduled for  11:50 A.M.   Call this number if you have problems the morning of surgery: [phone]   Remember:  Do not eat food or drink liquids after midnight.lamoTRIgine (LAMICTAL)  Take these medicines the morning of surgery with A SIP OF WATER: clonazePAM (KLONOPIN),  lamoTRIgine (LAMICTAL), levETIRAcetam (KEPPRA).                STOP taking Aspiri , Aspirin Products (Goody Powder, Excedrin Migraine), Ibuprofen (Advil), Naproxen (Aleve), Vitamins and Herbal Products (ie Fish Oil)         Special Instructions:   [place]- Preparing For Surgery  Before surgery, you can play an important role. Because skin is not sterile, your skin needs to be as free of germs as possible. You can reduce the number of germs on your skin by washing with CHG (chlorahexidine gluconate) Soap before surgery.  CHG is an antiseptic cleaner which kills germs and bonds with the skin to continue killing germs even after washing.  Please do not use if you have an allergy to CHG or antibacterial soaps. If your skin becomes reddened/irritated stop using the CHG.  Do not shave (including legs and underarms) for at least 48 hours prior to first CHG shower. It is OK to shave your face.  Please follow these instructions carefully.   1. Shower the NIGHT BEFORE SURGERY and the MORNING OF SURGERY with CHG.   2. If you chose to wash your hair, wash your hair first as usual with your normal shampoo.  3. After you shampoo, rinse your hair and body thoroughly to remove the shampoo.  Wash your face and private area with the soap you use at home, then rinse.  4. Use  CHG as you would any other liquid soap. You can apply CHG directly to the skin and wash gently with a scrungie or a clean washcloth.   5. Apply the CHG Soap to your body ONLY FROM THE NECK DOWN.  Do not use on open wounds or open sores. Avoid contact with your eyes, ears, mouth and genitals (private parts). Wash genitals (private parts) with your normal soap.  6. Wash thoroughly, paying special attention to the area where your surgery will be performed.  7. Thoroughly rinse your body with warm water from the neck down.  8. DO NOT shower/wash with your normal soap after using and rinsing off the CHG Soap.  9. Pat yourself dry with a CLEAN TOWEL.   10. Wear CLEAN PAJAMAS   11. Place CLEAN SHEETS on your bed the night of your first shower and DO NOT SLEEP WITH PETS.  Day of Surgery:  Shower as above Do not apply any deodorants/lotions, perfumes, colognes. Please wear clean clothes to the hospital/surgery center.    Do not wear jewelry, make-up or nail polish.  Do not shave 48 hours prior to surgery.  Men may shave face and neck.  Do not bring valuables to the hospital.  [facility] is not responsible for any belongings or valuables.  Contacts, dentures or bridgework may not be worn into surgery.  Leave your suitcase in the car.  After surgery it may be brought to your room.  For patients admitted to the hospital, discharge time will be determined by your treatment team.  Patients discharged the day of surgery will not be allowed to drive home.   Please read over the following fact sheets that you were given:   Pain Booklet, Patient Instructions for Mupirocin Application, Incentive Spirometry, Surgical Site Infections.

## 2017-02-28 NOTE — Pre-Procedure Instructions (Signed)
JOCELYN LOWERY  02/28/2017      [address], [place] [address] Phone: [phone] Fax: [phone]    Your procedure is scheduled on Thursday, August 23 .  Report to [facility] Admitting at 9:50 AM                    Your surgery or procedure is scheduled for  11:50 A.M.   Call this number if you have problems the morning of surgery: [phone]   Remember:  Do not eat food or drink liquids after midnight.lamoTRIgine (LAMICTAL)  Take these medicines the morning of surgery with A SIP OF WATER: clonazePAM (KLONOPIN),  lamoTRIgine (LAMICTAL), levETIRAcetam (KEPPRA).                STOP taking Aspiri , Aspirin Products (Goody Powder, Excedrin Migraine), Ibuprofen (Advil), Naproxen (Aleve), Vitamins and Herbal Products (ie Fish Oil)         Special Instructions:   [place]- Preparing For Surgery  Before surgery, you can play an important role. Because skin is not sterile, your skin needs to be as free of germs as possible. You can reduce the number of germs on your skin by washing with CHG (chlorahexidine gluconate) Soap before surgery.  CHG is an antiseptic cleaner which kills germs and bonds with the skin to continue killing germs even after washing.  Please do not use if you have an allergy to CHG or antibacterial soaps. If your skin becomes reddened/irritated stop using the CHG.  Do not shave (including legs and underarms) for at least 48 hours prior to first CHG shower. It is OK to shave your face.  Please follow these instructions carefully.   1. Shower the NIGHT BEFORE SURGERY and the MORNING OF SURGERY with CHG.   2. If you chose to wash your hair, wash your hair first as usual with your normal shampoo.  3. After you shampoo, rinse your hair and body thoroughly to remove the shampoo.  Wash your face and private area with the soap you use at home, then rinse.  4. Use CHG  as you would any other liquid soap. You can apply CHG directly to the skin and wash gently with a scrungie or a clean washcloth.   5. Apply the CHG Soap to your body ONLY FROM THE NECK DOWN.  Do not use on open wounds or open sores. Avoid contact with your eyes, ears, mouth and genitals (private parts). Wash genitals (private parts) with your normal soap.  6. Wash thoroughly, paying special attention to the area where your surgery will be performed.  7. Thoroughly rinse your body with warm water from the neck down.  8. DO NOT shower/wash with your normal soap after using and rinsing off the CHG Soap.  9. Pat yourself dry with a CLEAN TOWEL.   10. Wear CLEAN PAJAMAS   11. Place CLEAN SHEETS on your bed the night of your first shower and DO NOT SLEEP WITH PETS.  Day of Surgery:  Shower as above Do not apply any deodorants/lotions, perfumes, colognes. Please wear clean clothes to the hospital/surgery center.    Do not wear jewelry, make-up or nail polish.  Do not shave 48 hours prior to surgery.  Men may shave face and neck.  Do not bring valuables to the hospital.  [facility] is not responsible for any belongings or valuables.  Contacts, dentures or bridgework may not be worn into surgery.  Leave your suitcase in the car.  After surgery it may be brought to your room.  For patients admitted to the hospital, discharge time will be determined by your treatment team.  Patients discharged the day of surgery will not be allowed to drive home.   Please read over the following fact sheets that you were given:   Pain Booklet, Patient Instructions for Mupirocin Application, Incentive Spirometry, Surgical Site Infections.

## 2017-02-28 NOTE — Progress Notes (Signed)
Pt. Denies chest concerns today. He reports that he had a stress test with Dr. Wynonia Lawman in perhaps 2010- told that there was not a need to follow up . Pt.'s skin integrity is threatened in several places. There is a gauze wrap on L forearm.  Pt. Falls often, pt.;s wife remarks that there have been 31 falls in the past couple of months. Since being seen in ED at [facility] on 8/19, he has consumed more alcohol for pain in his L foot, relative to his spine situation. Pt.'s wife is carrying a Rx from Dr. Jannifer Franklin for Clonazepam if he is not drinking; since he continues to consume alcohol he has not had any of that Rx. Pcp- Kathyrn Lass. Yesterday had review for surgical clearance with Dr. Radene Ou. His GI status is followed by Dr. Samara Deist. Pt. 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

## 2017-02-28 NOTE — Progress Notes (Signed)
Anesthesia PAT Evaluation: Patient is a 66 year old male scheduled for PLIF L5-S1, explore fusion on 03/02/17 by Dr. Newman Pies.  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  - Patient was seen at [facility] ED (see Care Everywhere) on 02/26/17 for a seizure believed to be related to alcohol withdrawal (although with known seizure disorder, previously last seizure was in 2006). By notes, he drinks 750 ml liquor ("fifth of liquer") per day and had only had a few beers since 02/23/17 and had not taken his last two Keppra doses. ED recommended an inpatient detoxification program of his choice. Patient's wife notified his neurologist Dr. Jannifer Franklin. She reported that patient was given Ativan at the hospital, but none at discharge. She was afraid surgery would be postponed due to his seizure and alcohol consumption. She reportedly also let Dr. Arnoldo Morale' staff aware. Dr. Jannifer Franklin spoke with patient's wife and wrote, "The patient had a seizure over the weekend, he had an averaging a fifth of alcohol daily. The patient had cut back on his alcohol intake and then had a seizure. The patient is on Keppra and Lamictal, but the treatment of alcohol withdrawal seizures is to stop drinking alcohol. The patient will be given clonazepam taking 0.5 mg twice daily for 3 days, then go to 1 tablet daily for 3 days, then stop the medication."  - PCP is Dr. Kathyrn Lass @ [facility]. He reports seeing Dr. Jordan Hawks Rankins 02/27/17 for ED follow-up. He is scheduled to see Dr. Sabra Heck for CPE on 02/28/17.  - Neurologist is Dr. Margette Fast.  -  Gastroenterologist is Dr. Teena Irani [facility]) and Dr. Steward Drone [facility]). Patient says he is seen for "colon issues" (not liver disease). - Pain Management is Dr. Gerlene Fee.  Meds include clonazepam 0.5 mg taper, Lamictal, Keppra. He has a prescription for clonazepam, but has not taken yet since he is continuing to drink a fifth of liquor daily. Overall he is compliant with Lamictal and Keppra with a rarely missed or late dose.   BP (!) 153/77   Pulse 94   Temp 37.1 C   Resp 18   Ht 5\' 7"  (1.702 m)   Wt 142 lb (64.4 kg)   SpO2 100%   BMI 22.24 kg/m  Patient presents with his wife at side. He is in a wheelchair, but can walk short distances independently. He denied SOB, chest pain, edema, palpitations, syncope. He had a brothHe confirms he is drinking "approximately a fifth of liquor" daily (he thinks a little less, his wife thinks a little more). He denied prior anesthesia complications. He activity is very limited currently--it would be difficult for him to walk down the hospital hallway entrance due to left foot pain. He also has a skin tear around his LUE AC site. He reports Dr. Radene Ou placed him on antibiotics X 5 days (Keflex?) and has noticed improvement in the redness. He denied fever. She also prescribed folic acid which he hasn't started yet. Exam shows a thin Caucasian male in NAD. Heart RRR. I did not appreciate a murmur. Lungs clear. No ankle edema. He has an Opsite-type dressing in his left AC area. There is mild erythema, but mainly look of skin  excoriation. There is a small amount of serous drainage with a scant amount of purulent drainage. (They have dressing supplies at home and will change dressing this afternoon. They were told to keep a close eye to ensure it continues to improve on antibiotics and local would care since any source of infection could lead to a surgery being postponed.) He does have a fine tremor. Mallampati II. No carotid bruits noted.    EKG 02/28/17:  NSR, non-specific ST abnormality (V4-6). Non-specific ST abnormality is new when compared to 12/14/15 tracing.   He reports a normal treadmill exercise stress test > 10 years ago.   Chest CT (lung CA screen low dose) 05/02/16: IMPRESSION: 1. Lung-RADS Category 2, benign appearance or behavior. Continue annual screening with low-dose chest CT without contrast in 12 months. 2. Coronary artery and thoracoabdominal aortic atherosclerosis. 3. Emphysema.  Abdominal aortic U/S 03/03/14: IMPRESSION: Abdominal aortic atherosclerosis without evidence of aneurysm.  CT/Myelogram L-spine 01/25/17: IMPRESSION: LUMBAR MYELOGRAM IMPRESSION: - Status post L4-5 posterior and interbody fusion. No solid arthrodesis is established. No residual impingement at the fusion level. - Mild dynamic instability, adjacent segment, L3-L4. Mild stenosis with RIGHT greater than LEFT L4 nerve root impingement. - Advanced disc space narrowing L5-S1 with osseous spurring. Shallow extradural defects affect both S1 nerve roots. 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 Hardware remains appropriately located and intact. - Advanced disc space narrowing L5-S1, central protrusion with osseous spurring. Severe foraminal narrowing on the LEFT due to loss of interspace height and bony overgrowth is likely contributory for patient's LEFT foot drop. - There is a nondisplaced fracture of the LEFT L5 pedicle. Significance uncertain. 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 Mild stenosis. RIGHT greater than LEFT L3 and L4 nerve root impingement.  Labs from 02/26/17 done in [facility]) included a UA that showed trace protein, 15 ketones, trace blood, 2.0 urobilinogen, no nitrates or leukocytes; lipase 41, total protein 6.8, H/H 13.9/40.0, PLT 136, Cr 0.90. Total bilirubin 1.3. AST 172. ALT 70. K 3.1. Anion gap 17. Repeat CMET today  showed total bilirubin 1.6, AST 100, ALT 60. PT/PTT WNL. K 2.6.   Discussed above with anesthesiologist Dr. Therisa Doyne. Dr. Jannifer Franklin is aware of recent seizure, thought to be related to alcohol withdrawal. Patient/wife will contact him or discuss with Dr. Sabra Heck whether or not to recheck seizure medication levels.  Patient is not taking the clonazepam since he is back to drinking ETOH. Prior to surgery he would be NPO, so would recommend that he at least take one clonazepam as prescribed on the morning of surgery with sips of water. He will also need his hypokalemia treated. Patient would need to be monitored closely post-operatively for alcohol withdrawal/DTs. May need hospitalist consult, DT protocol, or prescribed ETOH while hospitalized. (Update: I received a call from [place] at Dr. Adline Mango office. They will have Dr. Sabra Heck address patient's hypokalemia. Dr. Arnoldo Morale would also like patient to follow-up with Dr. Sabra Heck for preoperative clearance and discuss possible plan for reducing ETOH intake prior to surgery. Surgery to be cancelled for now.)  [facility] Short Stay Center/Anesthesiology Phone [phone] 02/28/2017 5:31 PM    .

## 2017-02-28 NOTE — Progress Notes (Signed)
Call from lab, re: panic K+, call to [employer]. PA-C with same information

## 2017-03-02 ENCOUNTER — Encounter (HOSPITAL_COMMUNITY): Admission: RE | Payer: Self-pay | Source: Ambulatory Visit

## 2017-03-02 ENCOUNTER — Inpatient Hospital Stay (HOSPITAL_COMMUNITY): Admission: RE | Admit: 2017-03-02 | Payer: Medicare Other | Source: Ambulatory Visit | Admitting: Neurosurgery

## 2017-03-02 SURGERY — POSTERIOR LUMBAR FUSION 1 LEVEL
Anesthesia: General

## 2017-03-07 ENCOUNTER — Telehealth (HOSPITAL_COMMUNITY): Payer: Self-pay | Admitting: Psychology

## 2017-03-14 ENCOUNTER — Emergency Department (HOSPITAL_COMMUNITY)
Admission: EM | Admit: 2017-03-14 | Discharge: 2017-03-16 | Disposition: A | Discharge status: Other Acute Inpatient Hospital | Payer: Medicare Other | Attending: Emergency Medicine | Admitting: Emergency Medicine

## 2017-03-14 DIAGNOSIS — I10 Essential (primary) hypertension: Secondary | ICD-10-CM | POA: Insufficient documentation

## 2017-03-14 DIAGNOSIS — F17210 Nicotine dependence, cigarettes, uncomplicated: Secondary | ICD-10-CM | POA: Diagnosis not present

## 2017-03-14 DIAGNOSIS — F411 Generalized anxiety disorder: Secondary | ICD-10-CM | POA: Diagnosis present

## 2017-03-14 DIAGNOSIS — J449 Chronic obstructive pulmonary disease, unspecified: Secondary | ICD-10-CM | POA: Insufficient documentation

## 2017-03-14 DIAGNOSIS — F1020 Alcohol dependence, uncomplicated: Secondary | ICD-10-CM | POA: Diagnosis present

## 2017-03-14 DIAGNOSIS — F10980 Alcohol use, unspecified with alcohol-induced anxiety disorder: Secondary | ICD-10-CM

## 2017-03-14 DIAGNOSIS — Z751 Person awaiting admission to adequate facility elsewhere: Secondary | ICD-10-CM

## 2017-03-14 DIAGNOSIS — F1010 Alcohol abuse, uncomplicated: Secondary | ICD-10-CM | POA: Insufficient documentation

## 2017-03-14 DIAGNOSIS — Z79899 Other long term (current) drug therapy: Secondary | ICD-10-CM | POA: Diagnosis not present

## 2017-03-14 DIAGNOSIS — R69 Illness, unspecified: Secondary | ICD-10-CM | POA: Diagnosis present

## 2017-03-14 DIAGNOSIS — R918 Other nonspecific abnormal finding of lung field: Secondary | ICD-10-CM | POA: Diagnosis not present

## 2017-03-14 LAB — RAPID URINE DRUG SCREEN, HOSP PERFORMED
Amphetamines: NOT DETECTED
Barbiturates: NOT DETECTED
Benzodiazepines: POSITIVE — AB
Cocaine: NOT DETECTED
Opiates: NOT DETECTED
Tetrahydrocannabinol: NOT DETECTED

## 2017-03-14 LAB — SALICYLATE LEVEL: Salicylate Lvl: <7 mg/dL (ref 2.8–30.0)

## 2017-03-14 LAB — CBC WITH DIFFERENTIAL/PLATELET
Basophils Absolute: 0 10*3/uL (ref 0.0–0.1)
Basophils Relative: 1 %
Eosinophils Absolute: 0.1 10*3/uL (ref 0.0–0.7)
Eosinophils Relative: 1 %
HCT: 42.2 % (ref 39.0–52.0)
Hemoglobin: 15 g/dL (ref 13.0–17.0)
Lymphocytes Relative: 22 %
Lymphs Abs: 1.6 10*3/uL (ref 0.7–4.0)
MCH: 37.7 pg — ABNORMAL HIGH (ref 26.0–34.0)
MCHC: 35.5 g/dL (ref 30.0–36.0)
MCV: 106 fL — ABNORMAL HIGH (ref 78.0–100.0)
Monocytes Absolute: 0.6 10*3/uL (ref 0.1–1.0)
Monocytes Relative: 8 %
Neutro Abs: 5 10*3/uL (ref 1.7–7.7)
Neutrophils Relative %: 68 %
Platelets: 267 10*3/uL (ref 150–400)
RBC: 3.98 MIL/uL — ABNORMAL LOW (ref 4.22–5.81)
RDW: 14 % (ref 11.5–15.5)
WBC: 7.3 10*3/uL (ref 4.0–10.5)

## 2017-03-14 LAB — COMPREHENSIVE METABOLIC PANEL
ALT: 56 U/L (ref 17–63)
AST: 84 U/L — ABNORMAL HIGH (ref 15–41)
Albumin: 3.9 g/dL (ref 3.5–5.0)
Alkaline Phosphatase: 112 U/L (ref 38–126)
Anion gap: 15 (ref 5–15)
BUN: 12 mg/dL (ref 6–20)
CO2: 25 mmol/L (ref 22–32)
Calcium: 9.4 mg/dL (ref 8.9–10.3)
Chloride: 103 mmol/L (ref 101–111)
Creatinine, Ser: 0.83 mg/dL (ref 0.61–1.24)
GFR calc Af Amer: >60 mL/min (ref >60)
GFR calc non Af Amer: >60 mL/min (ref >60)
Glucose, Bld: 133 mg/dL — ABNORMAL HIGH (ref 65–99)
Potassium: 4.1 mmol/L (ref 3.5–5.1)
Sodium: 143 mmol/L (ref 135–145)
Total Bilirubin: 0.6 mg/dL (ref 0.3–1.2)
Total Protein: 7.4 g/dL (ref 6.5–8.1)

## 2017-03-14 LAB — ETHANOL: Alcohol, Ethyl (B): 174 mg/dL — ABNORMAL HIGH (ref <5)

## 2017-03-14 LAB — ACETAMINOPHEN LEVEL: Acetaminophen (Tylenol), Serum: <10 ug/mL — ABNORMAL LOW (ref 10–30)

## 2017-03-14 MED ORDER — LORAZEPAM 2 MG/ML IJ SOLN: 0.0000 mg | Freq: Two times a day (BID) | INTRAMUSCULAR | Status: DC

## 2017-03-14 MED ORDER — LAMOTRIGINE 25 MG PO TABS
50.0000 mg | ORAL_TABLET | Freq: Two times a day (BID) | ORAL | Status: DC
  Administered 2017-03-14 – 2017-03-16 (×4): 50 mg via ORAL
  Filled 2017-03-14 (×4): qty 2

## 2017-03-14 MED ORDER — LEVETIRACETAM 500 MG PO TABS
500.0000 mg | ORAL_TABLET | Freq: Two times a day (BID) | ORAL | Status: DC
  Administered 2017-03-14 – 2017-03-16 (×4): 500 mg via ORAL
  Filled 2017-03-14 (×4): qty 1

## 2017-03-14 MED ORDER — CHLORDIAZEPOXIDE HCL 25 MG PO CAPS
150.0000 mg | ORAL_CAPSULE | Freq: Once | ORAL | Status: AC
  Administered 2017-03-14: 150 mg via ORAL
  Filled 2017-03-14: qty 6

## 2017-03-14 MED ORDER — FOLIC ACID 1 MG PO TABS
1.0000 mg | ORAL_TABLET | Freq: Every day | ORAL | Status: DC
  Administered 2017-03-14 – 2017-03-16 (×3): 1 mg via ORAL
  Filled 2017-03-14 (×3): qty 1

## 2017-03-14 MED ORDER — POTASSIUM CHLORIDE CRYS ER 20 MEQ PO TBCR
20.0000 meq | EXTENDED_RELEASE_TABLET | Freq: Every day | ORAL | Status: DC
  Administered 2017-03-14 – 2017-03-16 (×3): 20 meq via ORAL
  Filled 2017-03-14 (×3): qty 1

## 2017-03-14 MED ORDER — CHLORDIAZEPOXIDE HCL 25 MG PO CAPS
100.0000 mg | ORAL_CAPSULE | Freq: Three times a day (TID) | ORAL | Status: DC
  Administered 2017-03-14 – 2017-03-15 (×2): 100 mg via ORAL
  Filled 2017-03-14 (×2): qty 4

## 2017-03-14 MED ORDER — LORAZEPAM 1 MG PO TABS
0.0000 mg | ORAL_TABLET | Freq: Four times a day (QID) | ORAL | Status: AC
  Administered 2017-03-14: 1 mg via ORAL
  Administered 2017-03-14: 2 mg via ORAL
  Administered 2017-03-15 (×2): 1 mg via ORAL
  Administered 2017-03-16 (×2): 2 mg via ORAL
  Filled 2017-03-14 (×5): qty 2
  Filled 2017-03-14 (×2): qty 1

## 2017-03-14 MED ORDER — LORAZEPAM 2 MG/ML IJ SOLN
0.0000 mg | Freq: Four times a day (QID) | INTRAMUSCULAR | Status: AC
Start: 2017-03-14 — End: 2017-03-16
  Administered 2017-03-16: 2 mg via INTRAVENOUS

## 2017-03-14 MED ORDER — LORAZEPAM 1 MG PO TABS: 0.0000 mg | ORAL_TABLET | Freq: Two times a day (BID) | ORAL | Status: DC

## 2017-03-14 MED ORDER — ALUM & MAG HYDROXIDE-SIMETH 200-200-20 MG/5ML PO SUSP: 30.0000 mL | Freq: Four times a day (QID) | ORAL | Status: DC | PRN

## 2017-03-14 MED ORDER — ONDANSETRON HCL 4 MG PO TABS: 4.0000 mg | ORAL_TABLET | Freq: Three times a day (TID) | ORAL | Status: DC | PRN

## 2017-03-14 MED ORDER — VITAMIN B-1 100 MG PO TABS
100.0000 mg | ORAL_TABLET | Freq: Every day | ORAL | Status: DC
  Administered 2017-03-14 – 2017-03-16 (×3): 100 mg via ORAL
  Filled 2017-03-14 (×3): qty 1

## 2017-03-14 MED ORDER — THIAMINE HCL 100 MG/ML IJ SOLN: 100.0000 mg | Freq: Every day | INTRAMUSCULAR | Status: DC

## 2017-03-14 NOTE — ED Notes (Signed)
Concerned person from lobby informed EMT first that this Pt was threatening to throw himself on the floor and fake a heart attack to be seen faster. It was also heard that Pt's wife was going to leave and bring him alcohol because he drinks 1/5th of liquor a day.
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
## 2017-03-14 NOTE — ED Notes (Signed)
Bed: WA30 Expected date:  Expected time:  Means of arrival:  Comments: 

## 2017-03-14 NOTE — ED Triage Notes (Signed)
Per EMS-states PCP wanted him to come for detox-drinks a 5 th a day-states he went to [place] yesterday but he was drinking at the time-has chronic pain-history of CRPS

## 2017-03-14 NOTE — BH Assessment (Signed)
Zerita Boers, NP recommends inpatient treatment detox and geri-psych placement. TTS to look for placement

## 2017-03-15 ENCOUNTER — Emergency Department (HOSPITAL_COMMUNITY): Payer: Medicare Other

## 2017-03-15 DIAGNOSIS — F17210 Nicotine dependence, cigarettes, uncomplicated: Secondary | ICD-10-CM

## 2017-03-15 DIAGNOSIS — F1020 Alcohol dependence, uncomplicated: Secondary | ICD-10-CM | POA: Diagnosis present

## 2017-03-15 DIAGNOSIS — R918 Other nonspecific abnormal finding of lung field: Secondary | ICD-10-CM | POA: Diagnosis not present

## 2017-03-15 DIAGNOSIS — F10980 Alcohol use, unspecified with alcohol-induced anxiety disorder: Secondary | ICD-10-CM

## 2017-03-15 DIAGNOSIS — F1010 Alcohol abuse, uncomplicated: Secondary | ICD-10-CM | POA: Diagnosis not present

## 2017-03-15 DIAGNOSIS — F411 Generalized anxiety disorder: Secondary | ICD-10-CM | POA: Diagnosis present

## 2017-03-15 DIAGNOSIS — Z79899 Other long term (current) drug therapy: Secondary | ICD-10-CM

## 2017-03-15 LAB — URINALYSIS, ROUTINE W REFLEX MICROSCOPIC
Bacteria, UA: NONE SEEN
Bilirubin Urine: NEGATIVE
Glucose, UA: 50 mg/dL — AB
Ketones, ur: 5 mg/dL — AB
Leukocytes, UA: NEGATIVE
Nitrite: NEGATIVE
Protein, ur: NEGATIVE mg/dL
Specific Gravity, Urine: 1.018 (ref 1.005–1.030)
pH: 5 (ref 5.0–8.0)

## 2017-03-15 MED ORDER — NICOTINE 21 MG/24HR TD PT24
21.0000 mg | MEDICATED_PATCH | Freq: Once | TRANSDERMAL | Status: AC
  Administered 2017-03-15: 21 mg via TRANSDERMAL
  Filled 2017-03-15: qty 1

## 2017-03-15 NOTE — Progress Notes (Signed)
Pharmacy IV to PO conversion  The patient is ordered Thiamine by the intravenous route with a linked PO option available.  Based on criteria approved by the Pharmacy and [place], the IV option is being discontinued.   No active GI bleeding or impaired absorption  Not s/p esophagectomy  Documented ability to take oral medications for > 24 hr  Plan to continue treatment for at least 1 day  If you have any questions about this conversion, please contact the Pharmacy Department (ext [phone]).  Thank you.  Reuel Boom, PharmD Pager: [phone] 03/15/2017, 1:09 PM

## 2017-03-15 NOTE — ED Provider Notes (Signed)
Patient seen by myself this afternoon. The pt is well appearing, in NAD. He has no tremors or signs of significant w/d. No apparent hallucinations. I discussed pt's plan of care in detail with the pt and his wife, answering their questions. Pt is concerned that he has had some urinary urgency issues. I suspect this may be 2/2 BPH. UA reviewed, no signs of UTI. Renal function is normal. Will have RN check bladder scan, consider flomax if sx persist. Otherwise, pt will be continued on CIWA with Ativan.   Duffy Bruce, MD 03/15/17 2049
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
## 2017-03-15 NOTE — BH Assessment (Signed)
[place] Assessment Progress Note  Per Corena Pilgrim, MD, this pt would benefit from psychiatric hospitalization for detoxification at this time.  Please note that few facilities offer detoxification for geriatric patients.  The following facilities have been contacted to seek placement for this pt, with results as noted:  Beds available, information sent, decision pending:  [place]   At capacity:  [facility] [place], [place] Triage Specialist [phone]

## 2017-03-15 NOTE — Progress Notes (Signed)
03/15/17  1155 Went to speak with Dr. Loni Muse in regards to patients wife concerns about patient needing IV fluids, tremors, hallucinations, etc.. Per Dr. Loni Muse that is not what he is here to do. That needs to be address with ER doctor. Per Dr. Loni Muse he d/c Librium because that was not the right treatment for detox with Ativan. Patient's wife wanted to speak with Dr. Loni Muse, but Dr. Loni Muse states that's not what he do, family can speak with nurses, SW, CM in regards to patient care/ and placement.

## 2017-03-16 DIAGNOSIS — F10980 Alcohol use, unspecified with alcohol-induced anxiety disorder: Secondary | ICD-10-CM | POA: Diagnosis not present

## 2017-03-16 DIAGNOSIS — F17210 Nicotine dependence, cigarettes, uncomplicated: Secondary | ICD-10-CM | POA: Diagnosis not present

## 2017-03-16 DIAGNOSIS — F1010 Alcohol abuse, uncomplicated: Secondary | ICD-10-CM | POA: Diagnosis not present

## 2017-03-16 NOTE — Discharge Instructions (Signed)
Transfer to [facility].

## 2017-03-16 NOTE — Progress Notes (Signed)
Pt discharged to [facility] via [place] in good condition.

## 2017-03-16 NOTE — BH Assessment (Addendum)
[place] Assessment Progress Note  Per Corena Pilgrim, MD, this pt continues to require psychiatric hospitalization at this time.  At 13:11 Darlene calls from [facility].  Pt has been accepted to their facility by Dr Drusilla Kanner to Rm [id number].  The pt, who is currently under voluntary status, agrees to transfer, and has sign the [facility] consent for treatment form.  The signed form has been faxed to [facility].  EDP Lajean Saver, MD concurs with this decision.  Pt's nurse, Ginger, has been notified, and agrees to call report to [phone].  Pt is to be transported via [facility], along with the original signed consent form.  Jalene Mullet, [place] Triage Specialist [phone]

## 2017-03-16 NOTE — ED Provider Notes (Signed)
Beh health team indicates patient accepted at [facility] for inpatient tx by Dr Suella Broad.   Pt alert, and appear in no acute distress.  Vitals:   03/16/17 0035 03/16/17 0544  BP: (!) 167/78 (!) 155/70  Pulse: 70 88  Resp: 16 16  Temp:  98 F (36.7 C)  SpO2: 98% 98%   Pt currently appears stable for transfer.      Lajean Saver, MD 03/16/17 1435
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
## 2017-03-17 DIAGNOSIS — Z8673 Personal history of transient ischemic attack (TIA), and cerebral infarction without residual deficits: Secondary | ICD-10-CM | POA: Diagnosis not present

## 2017-03-17 DIAGNOSIS — R296 Repeated falls: Secondary | ICD-10-CM | POA: Diagnosis not present

## 2017-03-23 ENCOUNTER — Telehealth: Payer: Self-pay | Admitting: Neurology

## 2017-03-23 NOTE — Telephone Encounter (Signed)
Pt wife is calling to request the Dr Jannifer Franklin calls her re: state of pt.  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  Wife would like to know if records of pt CAT scan can be sent over to the facility that pt is at, please call

## 2017-03-23 NOTE — Telephone Encounter (Signed)
I called the wife. The patient had a recent CT the head done at [facility], I do not have access to that scan, she wanted me to fax the report to the doctor handling the detox rehabilitation for her husband.

## 2017-03-29 DIAGNOSIS — R4182 Altered mental status, unspecified: Secondary | ICD-10-CM | POA: Diagnosis not present

## 2017-03-29 DIAGNOSIS — Z7409 Other reduced mobility: Secondary | ICD-10-CM | POA: Diagnosis not present

## 2017-03-29 DIAGNOSIS — F0390 Unspecified dementia without behavioral disturbance: Secondary | ICD-10-CM | POA: Diagnosis not present

## 2017-04-14 DIAGNOSIS — G4089 Other seizures: Secondary | ICD-10-CM | POA: Diagnosis not present

## 2017-04-14 DIAGNOSIS — R279 Unspecified lack of coordination: Secondary | ICD-10-CM | POA: Diagnosis not present

## 2017-04-14 DIAGNOSIS — K7030 Alcoholic cirrhosis of liver without ascites: Secondary | ICD-10-CM | POA: Diagnosis not present

## 2017-04-17 ENCOUNTER — Telehealth: Payer: Self-pay | Admitting: Nurse Practitioner

## 2017-04-17 NOTE — Telephone Encounter (Signed)
Pt last seen 09/2016.  Was taking lamictal 25mg  tabs 2 tabs po bid.  (total 100mg  daily).

## 2017-04-17 NOTE — Telephone Encounter (Signed)
The patient has been on Lamictal taking 25 mg daily, he will convert to taking 25 mg twice daily for one week and then go to 50 mg twice daily, stay on this. He also remains on Keppra.

## 2017-04-17 NOTE — Telephone Encounter (Signed)
Patient called office in reference to lamoTRIgine (LAMICTAL) 25 MG tablet.  Patient states [facility] has discontinued the medication end of August.  Patient was placed on 25mg  every other day [facility].  Patient stated he complained with [facility] last week and they agreed to let patient take 25mg  daily, but has no further instructions.  Patient would like to know if he is able to start taking the 100mg  again.  Pharmacy- [facility].  Please call.

## 2017-05-02 DIAGNOSIS — R279 Unspecified lack of coordination: Secondary | ICD-10-CM | POA: Diagnosis not present

## 2017-05-02 DIAGNOSIS — M6281 Muscle weakness (generalized): Secondary | ICD-10-CM | POA: Diagnosis not present

## 2017-05-02 DIAGNOSIS — R262 Difficulty in walking, not elsewhere classified: Secondary | ICD-10-CM | POA: Diagnosis not present

## 2017-05-09 ENCOUNTER — Ambulatory Visit (INDEPENDENT_AMBULATORY_CARE_PROVIDER_SITE_OTHER)
Admission: RE | Admit: 2017-05-09 | Discharge: 2017-05-09 | Disposition: A | Discharge status: Home / Self Care | Payer: Medicare Other | Source: Ambulatory Visit | Attending: Acute Care | Admitting: Acute Care

## 2017-05-09 DIAGNOSIS — Z87891 Personal history of nicotine dependence: Secondary | ICD-10-CM | POA: Diagnosis not present

## 2017-05-09 DIAGNOSIS — F17210 Nicotine dependence, cigarettes, uncomplicated: Secondary | ICD-10-CM

## 2017-05-10 DIAGNOSIS — R279 Unspecified lack of coordination: Secondary | ICD-10-CM | POA: Diagnosis not present

## 2017-05-10 DIAGNOSIS — M6281 Muscle weakness (generalized): Secondary | ICD-10-CM | POA: Diagnosis not present

## 2017-05-10 DIAGNOSIS — R262 Difficulty in walking, not elsewhere classified: Secondary | ICD-10-CM | POA: Diagnosis not present

## 2017-05-11 DIAGNOSIS — R262 Difficulty in walking, not elsewhere classified: Secondary | ICD-10-CM | POA: Diagnosis not present

## 2017-05-11 DIAGNOSIS — M6281 Muscle weakness (generalized): Secondary | ICD-10-CM | POA: Diagnosis not present

## 2017-05-11 DIAGNOSIS — R279 Unspecified lack of coordination: Secondary | ICD-10-CM | POA: Diagnosis not present

## 2017-05-16 ENCOUNTER — Other Ambulatory Visit: Payer: Self-pay | Admitting: Acute Care

## 2017-05-16 DIAGNOSIS — S92354A Nondisplaced fracture of fifth metatarsal bone, right foot, initial encounter for closed fracture: Secondary | ICD-10-CM | POA: Diagnosis not present

## 2017-05-16 DIAGNOSIS — Z122 Encounter for screening for malignant neoplasm of respiratory organs: Secondary | ICD-10-CM

## 2017-05-16 DIAGNOSIS — F17210 Nicotine dependence, cigarettes, uncomplicated: Secondary | ICD-10-CM

## 2017-05-18 DIAGNOSIS — R279 Unspecified lack of coordination: Secondary | ICD-10-CM | POA: Diagnosis not present

## 2017-05-18 DIAGNOSIS — R262 Difficulty in walking, not elsewhere classified: Secondary | ICD-10-CM | POA: Diagnosis not present

## 2017-05-18 DIAGNOSIS — M6281 Muscle weakness (generalized): Secondary | ICD-10-CM | POA: Diagnosis not present

## 2017-05-22 DIAGNOSIS — I10 Essential (primary) hypertension: Secondary | ICD-10-CM | POA: Diagnosis not present

## 2017-05-25 DIAGNOSIS — R279 Unspecified lack of coordination: Secondary | ICD-10-CM | POA: Diagnosis not present

## 2017-05-25 DIAGNOSIS — R262 Difficulty in walking, not elsewhere classified: Secondary | ICD-10-CM | POA: Diagnosis not present

## 2017-05-25 DIAGNOSIS — M6281 Muscle weakness (generalized): Secondary | ICD-10-CM | POA: Diagnosis not present

## 2017-06-06 DIAGNOSIS — M6281 Muscle weakness (generalized): Secondary | ICD-10-CM | POA: Diagnosis not present

## 2017-06-06 DIAGNOSIS — R279 Unspecified lack of coordination: Secondary | ICD-10-CM | POA: Diagnosis not present

## 2017-06-06 DIAGNOSIS — R262 Difficulty in walking, not elsewhere classified: Secondary | ICD-10-CM | POA: Diagnosis not present

## 2017-06-08 DIAGNOSIS — R262 Difficulty in walking, not elsewhere classified: Secondary | ICD-10-CM | POA: Diagnosis not present

## 2017-06-08 DIAGNOSIS — R279 Unspecified lack of coordination: Secondary | ICD-10-CM | POA: Diagnosis not present

## 2017-06-08 DIAGNOSIS — M6281 Muscle weakness (generalized): Secondary | ICD-10-CM | POA: Diagnosis not present

## 2017-06-09 ENCOUNTER — Other Ambulatory Visit: Payer: Self-pay | Admitting: Neurology

## 2017-06-13 DIAGNOSIS — R262 Difficulty in walking, not elsewhere classified: Secondary | ICD-10-CM | POA: Diagnosis not present

## 2017-06-13 DIAGNOSIS — S92354D Nondisplaced fracture of fifth metatarsal bone, right foot, subsequent encounter for fracture with routine healing: Secondary | ICD-10-CM | POA: Diagnosis not present

## 2017-06-13 DIAGNOSIS — M6281 Muscle weakness (generalized): Secondary | ICD-10-CM | POA: Diagnosis not present

## 2017-06-13 DIAGNOSIS — R279 Unspecified lack of coordination: Secondary | ICD-10-CM | POA: Diagnosis not present

## 2017-06-14 ENCOUNTER — Ambulatory Visit (HOSPITAL_COMMUNITY)
Admission: RE | Admit: 2017-06-14 | Discharge: 2017-06-14 | Disposition: A | Discharge status: Home / Self Care | Payer: Medicare Other | Source: Ambulatory Visit | Attending: Family Medicine | Admitting: Family Medicine

## 2017-06-14 ENCOUNTER — Encounter (HOSPITAL_COMMUNITY): Payer: Self-pay

## 2017-06-14 DIAGNOSIS — M810 Age-related osteoporosis without current pathological fracture: Secondary | ICD-10-CM | POA: Diagnosis not present

## 2017-06-14 MED ORDER — SODIUM CHLORIDE 0.9 % IV SOLN
Freq: Once | INTRAVENOUS | Status: AC
  Administered 2017-06-14: 11:00:00 via INTRAVENOUS

## 2017-06-14 MED ORDER — ZOLEDRONIC ACID 5 MG/100ML IV SOLN
5.0000 mg | Freq: Once | INTRAVENOUS | Status: AC
  Administered 2017-06-14: 5 mg via INTRAVENOUS
  Filled 2017-06-14: qty 100

## 2017-06-14 NOTE — Discharge Instructions (Signed)
Check with your MD with any problems or questions. Call 911 for any emergencies.  Drink  Fluids/water as tolerated over the next 72 hours Tylenol or ibuprofen if needed for aches and pains Continue Calcium and Vit D as directed by your MD      Reclast  Zoledronic Acid injection (Paget's Disease, Osteoporosis) What is this medicine? ZOLEDRONIC ACID (ZOE le dron ik AS id) lowers the amount of calcium loss from bone. It is used to treat Paget's disease and osteoporosis in women. This medicine may be used for other purposes; ask your health care provider or pharmacist if you have questions. COMMON BRAND NAME(S): Reclast, Zometa What should I tell my health care provider before I take this medicine? They need to know if you have any of these conditions: -aspirin-sensitive asthma -cancer, especially if you are receiving medicines used to treat cancer -dental disease or wear dentures -infection -kidney disease -low levels of calcium in the blood -past surgery on the parathyroid gland or intestines -receiving corticosteroids like dexamethasone or prednisone -an unusual or allergic reaction to zoledronic acid, other medicines, foods, dyes, or preservatives -pregnant or trying to get pregnant -breast-feeding How should I use this medicine? This medicine is for infusion into a vein. It is given by a health care professional in a hospital or clinic setting. Talk to your pediatrician regarding the use of this medicine in children. This medicine is not approved for use in children. Overdosage: If you think you have taken too much of this medicine contact a poison control center or emergency room at once. NOTE: This medicine is only for you. Do not share this medicine with others. What if I miss a dose? It is important not to miss your dose. Call your doctor or health care professional if you are unable to keep an appointment. What may interact with this medicine? -certain antibiotics  given by injection -NSAIDs, medicines for pain and inflammation, like ibuprofen or naproxen -some diuretics like bumetanide, furosemide -teriparatide This list may not describe all possible interactions. Give your health care provider a list of all the medicines, herbs, non-prescription drugs, or dietary supplements you use. Also tell them if you smoke, drink alcohol, or use illegal drugs. Some items may interact with your medicine. What should I watch for while using this medicine? Visit your doctor or health care professional for regular checkups. It may be some time before you see the benefit from this medicine. Do not stop taking your medicine unless your doctor tells you to. Your doctor may order blood tests or other tests to see how you are doing. Women should inform their doctor if they wish to become pregnant or think they might be pregnant. There is a potential for serious side effects to an unborn child. Talk to your health care professional or pharmacist for more information. You should make sure that you get enough calcium and vitamin D while you are taking this medicine. Discuss the foods you eat and the vitamins you take with your health care professional. Some people who take this medicine have severe bone, joint, and/or muscle pain. This medicine may also increase your risk for jaw problems or a broken thigh bone. Tell your doctor right away if you have severe pain in your jaw, bones, joints, or muscles. Tell your doctor if you have any pain that does not go away or that gets worse. Tell your dentist and dental surgeon that you are taking this medicine. You should not have major  dental surgery while on this medicine. See your dentist to have a dental exam and fix any dental problems before starting this medicine. Take good care of your teeth while on this medicine. Make sure you see your dentist for regular follow-up appointments. What side effects may I notice from receiving this  medicine? Side effects that you should report to your doctor or health care professional as soon as possible: -allergic reactions like skin rash, itching or hives, swelling of the face, lips, or tongue -anxiety, confusion, or depression -breathing problems -changes in vision -eye pain -feeling faint or lightheaded, falls -jaw pain, especially after dental work -mouth sores -muscle cramps, stiffness, or weakness -redness, blistering, peeling or loosening of the skin, including inside the mouth -trouble passing urine or change in the amount of urine Side effects that usually do not require medical attention (report to your doctor or health care professional if they continue or are bothersome): -bone, joint, or muscle pain -constipation -diarrhea -fever -hair loss -irritation at site where injected -loss of appetite -nausea, vomiting -stomach upset -trouble sleeping -trouble swallowing -weak or tired This list may not describe all possible side effects. Call your doctor for medical advice about side effects. You may report side effects to FDA at 1-800-FDA-1088. Where should I keep my medicine? This drug is given in a hospital or clinic and will not be stored at home. NOTE: This sheet is a summary. It may not cover all possible information. If you have questions about this medicine, talk to your doctor, pharmacist, or health care provider.  2018 Elsevier/Gold Standard (2013-11-23 14:19:57)

## 2017-06-15 DIAGNOSIS — R262 Difficulty in walking, not elsewhere classified: Secondary | ICD-10-CM | POA: Diagnosis not present

## 2017-06-15 DIAGNOSIS — M6281 Muscle weakness (generalized): Secondary | ICD-10-CM | POA: Diagnosis not present

## 2017-06-15 DIAGNOSIS — R279 Unspecified lack of coordination: Secondary | ICD-10-CM | POA: Diagnosis not present

## 2017-06-16 DIAGNOSIS — R262 Difficulty in walking, not elsewhere classified: Secondary | ICD-10-CM | POA: Diagnosis not present

## 2017-06-16 DIAGNOSIS — M6281 Muscle weakness (generalized): Secondary | ICD-10-CM | POA: Diagnosis not present

## 2017-06-16 DIAGNOSIS — R279 Unspecified lack of coordination: Secondary | ICD-10-CM | POA: Diagnosis not present

## 2017-06-21 DIAGNOSIS — R279 Unspecified lack of coordination: Secondary | ICD-10-CM | POA: Diagnosis not present

## 2017-06-21 DIAGNOSIS — R262 Difficulty in walking, not elsewhere classified: Secondary | ICD-10-CM | POA: Diagnosis not present

## 2017-06-21 DIAGNOSIS — M6281 Muscle weakness (generalized): Secondary | ICD-10-CM | POA: Diagnosis not present

## 2017-06-22 DIAGNOSIS — M6281 Muscle weakness (generalized): Secondary | ICD-10-CM | POA: Diagnosis not present

## 2017-06-22 DIAGNOSIS — R279 Unspecified lack of coordination: Secondary | ICD-10-CM | POA: Diagnosis not present

## 2017-06-22 DIAGNOSIS — R262 Difficulty in walking, not elsewhere classified: Secondary | ICD-10-CM | POA: Diagnosis not present

## 2017-06-23 DIAGNOSIS — R262 Difficulty in walking, not elsewhere classified: Secondary | ICD-10-CM | POA: Diagnosis not present

## 2017-06-23 DIAGNOSIS — R279 Unspecified lack of coordination: Secondary | ICD-10-CM | POA: Diagnosis not present

## 2017-06-23 DIAGNOSIS — M6281 Muscle weakness (generalized): Secondary | ICD-10-CM | POA: Diagnosis not present

## 2017-06-26 DIAGNOSIS — R279 Unspecified lack of coordination: Secondary | ICD-10-CM | POA: Diagnosis not present

## 2017-06-26 DIAGNOSIS — M6281 Muscle weakness (generalized): Secondary | ICD-10-CM | POA: Diagnosis not present

## 2017-06-26 DIAGNOSIS — R262 Difficulty in walking, not elsewhere classified: Secondary | ICD-10-CM | POA: Diagnosis not present

## 2017-06-27 DIAGNOSIS — R262 Difficulty in walking, not elsewhere classified: Secondary | ICD-10-CM | POA: Diagnosis not present

## 2017-06-27 DIAGNOSIS — M6281 Muscle weakness (generalized): Secondary | ICD-10-CM | POA: Diagnosis not present

## 2017-06-27 DIAGNOSIS — R279 Unspecified lack of coordination: Secondary | ICD-10-CM | POA: Diagnosis not present

## 2017-06-28 DIAGNOSIS — R279 Unspecified lack of coordination: Secondary | ICD-10-CM | POA: Diagnosis not present

## 2017-06-28 DIAGNOSIS — R262 Difficulty in walking, not elsewhere classified: Secondary | ICD-10-CM | POA: Diagnosis not present

## 2017-06-28 DIAGNOSIS — M6281 Muscle weakness (generalized): Secondary | ICD-10-CM | POA: Diagnosis not present

## 2017-06-29 DIAGNOSIS — L820 Inflamed seborrheic keratosis: Secondary | ICD-10-CM | POA: Diagnosis not present

## 2017-06-29 DIAGNOSIS — L570 Actinic keratosis: Secondary | ICD-10-CM | POA: Diagnosis not present

## 2017-06-29 DIAGNOSIS — R279 Unspecified lack of coordination: Secondary | ICD-10-CM | POA: Diagnosis not present

## 2017-06-29 DIAGNOSIS — L821 Other seborrheic keratosis: Secondary | ICD-10-CM | POA: Diagnosis not present

## 2017-06-29 DIAGNOSIS — L814 Other melanin hyperpigmentation: Secondary | ICD-10-CM | POA: Diagnosis not present

## 2017-06-29 DIAGNOSIS — R262 Difficulty in walking, not elsewhere classified: Secondary | ICD-10-CM | POA: Diagnosis not present

## 2017-06-29 DIAGNOSIS — M6281 Muscle weakness (generalized): Secondary | ICD-10-CM | POA: Diagnosis not present

## 2017-06-29 DIAGNOSIS — D229 Melanocytic nevi, unspecified: Secondary | ICD-10-CM | POA: Diagnosis not present

## 2017-06-30 DIAGNOSIS — R279 Unspecified lack of coordination: Secondary | ICD-10-CM | POA: Diagnosis not present

## 2017-06-30 DIAGNOSIS — M6281 Muscle weakness (generalized): Secondary | ICD-10-CM | POA: Diagnosis not present

## 2017-06-30 DIAGNOSIS — R262 Difficulty in walking, not elsewhere classified: Secondary | ICD-10-CM | POA: Diagnosis not present

## 2017-07-06 DIAGNOSIS — M6281 Muscle weakness (generalized): Secondary | ICD-10-CM | POA: Diagnosis not present

## 2017-07-06 DIAGNOSIS — R279 Unspecified lack of coordination: Secondary | ICD-10-CM | POA: Diagnosis not present

## 2017-07-06 DIAGNOSIS — R262 Difficulty in walking, not elsewhere classified: Secondary | ICD-10-CM | POA: Diagnosis not present

## 2017-07-07 DIAGNOSIS — R279 Unspecified lack of coordination: Secondary | ICD-10-CM | POA: Diagnosis not present

## 2017-07-07 DIAGNOSIS — R262 Difficulty in walking, not elsewhere classified: Secondary | ICD-10-CM | POA: Diagnosis not present

## 2017-07-07 DIAGNOSIS — M6281 Muscle weakness (generalized): Secondary | ICD-10-CM | POA: Diagnosis not present

## 2017-07-12 DIAGNOSIS — R279 Unspecified lack of coordination: Secondary | ICD-10-CM | POA: Diagnosis not present

## 2017-07-12 DIAGNOSIS — R262 Difficulty in walking, not elsewhere classified: Secondary | ICD-10-CM | POA: Diagnosis not present

## 2017-07-12 DIAGNOSIS — M6281 Muscle weakness (generalized): Secondary | ICD-10-CM | POA: Diagnosis not present

## 2017-07-13 DIAGNOSIS — R279 Unspecified lack of coordination: Secondary | ICD-10-CM | POA: Diagnosis not present

## 2017-07-13 DIAGNOSIS — R262 Difficulty in walking, not elsewhere classified: Secondary | ICD-10-CM | POA: Diagnosis not present

## 2017-07-13 DIAGNOSIS — M6281 Muscle weakness (generalized): Secondary | ICD-10-CM | POA: Diagnosis not present

## 2017-07-17 DIAGNOSIS — S92354D Nondisplaced fracture of fifth metatarsal bone, right foot, subsequent encounter for fracture with routine healing: Secondary | ICD-10-CM | POA: Diagnosis not present

## 2017-07-19 DIAGNOSIS — R262 Difficulty in walking, not elsewhere classified: Secondary | ICD-10-CM | POA: Diagnosis not present

## 2017-07-19 DIAGNOSIS — M6281 Muscle weakness (generalized): Secondary | ICD-10-CM | POA: Diagnosis not present

## 2017-07-19 DIAGNOSIS — R279 Unspecified lack of coordination: Secondary | ICD-10-CM | POA: Diagnosis not present

## 2017-07-20 DIAGNOSIS — M6281 Muscle weakness (generalized): Secondary | ICD-10-CM | POA: Diagnosis not present

## 2017-07-20 DIAGNOSIS — R262 Difficulty in walking, not elsewhere classified: Secondary | ICD-10-CM | POA: Diagnosis not present

## 2017-07-20 DIAGNOSIS — R279 Unspecified lack of coordination: Secondary | ICD-10-CM | POA: Diagnosis not present

## 2017-07-25 DIAGNOSIS — R279 Unspecified lack of coordination: Secondary | ICD-10-CM | POA: Diagnosis not present

## 2017-07-25 DIAGNOSIS — R262 Difficulty in walking, not elsewhere classified: Secondary | ICD-10-CM | POA: Diagnosis not present

## 2017-07-25 DIAGNOSIS — M6281 Muscle weakness (generalized): Secondary | ICD-10-CM | POA: Diagnosis not present

## 2017-08-01 DIAGNOSIS — R279 Unspecified lack of coordination: Secondary | ICD-10-CM | POA: Diagnosis not present

## 2017-08-01 DIAGNOSIS — R262 Difficulty in walking, not elsewhere classified: Secondary | ICD-10-CM | POA: Diagnosis not present

## 2017-08-01 DIAGNOSIS — M6281 Muscle weakness (generalized): Secondary | ICD-10-CM | POA: Diagnosis not present

## 2017-08-03 DIAGNOSIS — R262 Difficulty in walking, not elsewhere classified: Secondary | ICD-10-CM | POA: Diagnosis not present

## 2017-08-03 DIAGNOSIS — M6281 Muscle weakness (generalized): Secondary | ICD-10-CM | POA: Diagnosis not present

## 2017-08-03 DIAGNOSIS — R279 Unspecified lack of coordination: Secondary | ICD-10-CM | POA: Diagnosis not present

## 2017-08-08 DIAGNOSIS — M6281 Muscle weakness (generalized): Secondary | ICD-10-CM | POA: Diagnosis not present

## 2017-08-08 DIAGNOSIS — R279 Unspecified lack of coordination: Secondary | ICD-10-CM | POA: Diagnosis not present

## 2017-08-08 DIAGNOSIS — R262 Difficulty in walking, not elsewhere classified: Secondary | ICD-10-CM | POA: Diagnosis not present

## 2017-08-10 DIAGNOSIS — R279 Unspecified lack of coordination: Secondary | ICD-10-CM | POA: Diagnosis not present

## 2017-08-10 DIAGNOSIS — M6281 Muscle weakness (generalized): Secondary | ICD-10-CM | POA: Diagnosis not present

## 2017-08-10 DIAGNOSIS — R262 Difficulty in walking, not elsewhere classified: Secondary | ICD-10-CM | POA: Diagnosis not present

## 2017-08-14 DIAGNOSIS — M6281 Muscle weakness (generalized): Secondary | ICD-10-CM | POA: Diagnosis not present

## 2017-08-14 DIAGNOSIS — R279 Unspecified lack of coordination: Secondary | ICD-10-CM | POA: Diagnosis not present

## 2017-08-14 DIAGNOSIS — R262 Difficulty in walking, not elsewhere classified: Secondary | ICD-10-CM | POA: Diagnosis not present

## 2017-08-15 DIAGNOSIS — M6281 Muscle weakness (generalized): Secondary | ICD-10-CM | POA: Diagnosis not present

## 2017-08-15 DIAGNOSIS — R279 Unspecified lack of coordination: Secondary | ICD-10-CM | POA: Diagnosis not present

## 2017-08-15 DIAGNOSIS — R262 Difficulty in walking, not elsewhere classified: Secondary | ICD-10-CM | POA: Diagnosis not present

## 2017-08-17 DIAGNOSIS — R279 Unspecified lack of coordination: Secondary | ICD-10-CM | POA: Diagnosis not present

## 2017-08-17 DIAGNOSIS — R262 Difficulty in walking, not elsewhere classified: Secondary | ICD-10-CM | POA: Diagnosis not present

## 2017-08-17 DIAGNOSIS — M6281 Muscle weakness (generalized): Secondary | ICD-10-CM | POA: Diagnosis not present

## 2017-08-18 DIAGNOSIS — K210 Gastro-esophageal reflux disease with esophagitis: Secondary | ICD-10-CM | POA: Diagnosis not present

## 2017-08-18 DIAGNOSIS — I10 Essential (primary) hypertension: Secondary | ICD-10-CM | POA: Diagnosis not present

## 2017-08-18 DIAGNOSIS — D132 Benign neoplasm of duodenum: Secondary | ICD-10-CM | POA: Diagnosis not present

## 2017-08-18 DIAGNOSIS — K317 Polyp of stomach and duodenum: Secondary | ICD-10-CM | POA: Diagnosis not present

## 2017-08-18 DIAGNOSIS — K2980 Duodenitis without bleeding: Secondary | ICD-10-CM | POA: Diagnosis not present

## 2017-08-21 DIAGNOSIS — R262 Difficulty in walking, not elsewhere classified: Secondary | ICD-10-CM | POA: Diagnosis not present

## 2017-08-21 DIAGNOSIS — R279 Unspecified lack of coordination: Secondary | ICD-10-CM | POA: Diagnosis not present

## 2017-08-21 DIAGNOSIS — M6281 Muscle weakness (generalized): Secondary | ICD-10-CM | POA: Diagnosis not present

## 2017-08-22 DIAGNOSIS — M6281 Muscle weakness (generalized): Secondary | ICD-10-CM | POA: Diagnosis not present

## 2017-08-22 DIAGNOSIS — R262 Difficulty in walking, not elsewhere classified: Secondary | ICD-10-CM | POA: Diagnosis not present

## 2017-08-22 DIAGNOSIS — E538 Deficiency of other specified B group vitamins: Secondary | ICD-10-CM | POA: Diagnosis not present

## 2017-08-22 DIAGNOSIS — R279 Unspecified lack of coordination: Secondary | ICD-10-CM | POA: Diagnosis not present

## 2017-08-24 DIAGNOSIS — R279 Unspecified lack of coordination: Secondary | ICD-10-CM | POA: Diagnosis not present

## 2017-08-24 DIAGNOSIS — R262 Difficulty in walking, not elsewhere classified: Secondary | ICD-10-CM | POA: Diagnosis not present

## 2017-08-24 DIAGNOSIS — M6281 Muscle weakness (generalized): Secondary | ICD-10-CM | POA: Diagnosis not present

## 2017-08-28 DIAGNOSIS — M6281 Muscle weakness (generalized): Secondary | ICD-10-CM | POA: Diagnosis not present

## 2017-08-28 DIAGNOSIS — R262 Difficulty in walking, not elsewhere classified: Secondary | ICD-10-CM | POA: Diagnosis not present

## 2017-08-28 DIAGNOSIS — R279 Unspecified lack of coordination: Secondary | ICD-10-CM | POA: Diagnosis not present

## 2017-08-29 DIAGNOSIS — R262 Difficulty in walking, not elsewhere classified: Secondary | ICD-10-CM | POA: Diagnosis not present

## 2017-08-29 DIAGNOSIS — M6281 Muscle weakness (generalized): Secondary | ICD-10-CM | POA: Diagnosis not present

## 2017-08-29 DIAGNOSIS — R279 Unspecified lack of coordination: Secondary | ICD-10-CM | POA: Diagnosis not present

## 2017-09-04 DIAGNOSIS — M6281 Muscle weakness (generalized): Secondary | ICD-10-CM | POA: Diagnosis not present

## 2017-09-04 DIAGNOSIS — R262 Difficulty in walking, not elsewhere classified: Secondary | ICD-10-CM | POA: Diagnosis not present

## 2017-09-04 DIAGNOSIS — R279 Unspecified lack of coordination: Secondary | ICD-10-CM | POA: Diagnosis not present

## 2017-09-05 DIAGNOSIS — M6281 Muscle weakness (generalized): Secondary | ICD-10-CM | POA: Diagnosis not present

## 2017-09-05 DIAGNOSIS — R262 Difficulty in walking, not elsewhere classified: Secondary | ICD-10-CM | POA: Diagnosis not present

## 2017-09-05 DIAGNOSIS — R279 Unspecified lack of coordination: Secondary | ICD-10-CM | POA: Diagnosis not present

## 2017-09-07 DIAGNOSIS — R262 Difficulty in walking, not elsewhere classified: Secondary | ICD-10-CM | POA: Diagnosis not present

## 2017-09-07 DIAGNOSIS — R279 Unspecified lack of coordination: Secondary | ICD-10-CM | POA: Diagnosis not present

## 2017-09-07 DIAGNOSIS — M6281 Muscle weakness (generalized): Secondary | ICD-10-CM | POA: Diagnosis not present

## 2017-09-13 DIAGNOSIS — R279 Unspecified lack of coordination: Secondary | ICD-10-CM | POA: Diagnosis not present

## 2017-09-13 DIAGNOSIS — R262 Difficulty in walking, not elsewhere classified: Secondary | ICD-10-CM | POA: Diagnosis not present

## 2017-09-13 DIAGNOSIS — M6281 Muscle weakness (generalized): Secondary | ICD-10-CM | POA: Diagnosis not present
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
## 2017-09-18 ENCOUNTER — Ambulatory Visit (INDEPENDENT_AMBULATORY_CARE_PROVIDER_SITE_OTHER): Payer: Medicare Other | Admitting: Nurse Practitioner

## 2017-09-18 ENCOUNTER — Encounter: Payer: Self-pay | Admitting: Nurse Practitioner

## 2017-09-18 VITALS — BP 150/80 | HR 64 | Ht 67.72 in | Wt 158.0 lb

## 2017-09-18 DIAGNOSIS — R269 Unspecified abnormalities of gait and mobility: Secondary | ICD-10-CM

## 2017-09-18 DIAGNOSIS — G40309 Generalized idiopathic epilepsy and epileptic syndromes, not intractable, without status epilepticus: Secondary | ICD-10-CM | POA: Diagnosis not present

## 2017-09-18 MED ORDER — LEVETIRACETAM 500 MG PO TABS: 500.0000 mg | ORAL_TABLET | Freq: Two times a day (BID) | ORAL | 3 refills | Status: DC

## 2017-09-18 MED ORDER — LAMOTRIGINE 25 MG PO TABS: ORAL_TABLET | ORAL | 3 refills | Status: DC

## 2017-09-18 NOTE — Patient Instructions (Signed)
Continue Keppra 500 mg twice daily Continue Lamictal 25 mg 2 tablets twice daily will refill Call for any seizure activity Follow-up yearly and when necessary

## 2017-09-18 NOTE — Progress Notes (Signed)
I have read the note, and I agree with the clinical assessment and plan.  Khalil Belote K Keshawn Sundberg   

## 2017-11-03 DIAGNOSIS — R300 Dysuria: Secondary | ICD-10-CM | POA: Diagnosis not present

## 2017-11-03 DIAGNOSIS — R3915 Urgency of urination: Secondary | ICD-10-CM | POA: Diagnosis not present

## 2017-11-17 DIAGNOSIS — N3020 Other chronic cystitis without hematuria: Secondary | ICD-10-CM | POA: Diagnosis not present

## 2017-12-08 DIAGNOSIS — N400 Enlarged prostate without lower urinary tract symptoms: Secondary | ICD-10-CM | POA: Diagnosis not present

## 2017-12-08 DIAGNOSIS — R300 Dysuria: Secondary | ICD-10-CM | POA: Diagnosis not present

## 2017-12-19 DIAGNOSIS — Z961 Presence of intraocular lens: Secondary | ICD-10-CM | POA: Diagnosis not present

## 2018-01-22 ENCOUNTER — Telehealth: Payer: Self-pay | Admitting: Nurse Practitioner

## 2018-01-22 ENCOUNTER — Encounter: Payer: Self-pay | Admitting: *Deleted

## 2018-01-22 NOTE — Telephone Encounter (Signed)
Patient calling to schedule an appointment. He says neuropathy has spread from his left foot to his right foot.in the last 2 months. Can appointment be scheduled with Hoyle Sauer or does he need to be scheduled with Dr. Jannifer Franklin after this week?

## 2018-01-22 NOTE — Telephone Encounter (Signed)
We see him for seizure. He has not been evaluated here for neuropathy

## 2018-01-23 NOTE — Telephone Encounter (Signed)
Patient is scheduled for NP in August for neuropathy.
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
## 2018-02-14 ENCOUNTER — Encounter: Payer: Self-pay | Admitting: Nurse Practitioner

## 2018-02-14 ENCOUNTER — Ambulatory Visit (INDEPENDENT_AMBULATORY_CARE_PROVIDER_SITE_OTHER): Payer: Medicare Other | Admitting: Nurse Practitioner

## 2018-02-14 VITALS — BP 130/64 | HR 56 | Ht 67.0 in | Wt 151.8 lb

## 2018-02-14 DIAGNOSIS — G40309 Generalized idiopathic epilepsy and epileptic syndromes, not intractable, without status epilepticus: Secondary | ICD-10-CM

## 2018-02-14 NOTE — Patient Instructions (Signed)
Continue Keppra 500 mg twice daily will refill Continue Lamictal 25 mg 2 tablets twice daily will refill Call for any seizure activity Follow-up yearly and when necessary

## 2018-03-27 DIAGNOSIS — M545 Low back pain: Secondary | ICD-10-CM | POA: Diagnosis not present

## 2018-03-27 DIAGNOSIS — M21372 Foot drop, left foot: Secondary | ICD-10-CM | POA: Diagnosis not present

## 2018-03-27 DIAGNOSIS — M21371 Foot drop, right foot: Secondary | ICD-10-CM | POA: Diagnosis not present

## 2018-03-28 ENCOUNTER — Other Ambulatory Visit: Payer: Self-pay | Admitting: Neurosurgery

## 2018-03-28 DIAGNOSIS — M21372 Foot drop, left foot: Principal | ICD-10-CM

## 2018-03-28 DIAGNOSIS — M21371 Foot drop, right foot: Secondary | ICD-10-CM

## 2018-04-03 DIAGNOSIS — Z23 Encounter for immunization: Secondary | ICD-10-CM | POA: Diagnosis not present

## 2018-04-16 ENCOUNTER — Ambulatory Visit
Admission: RE | Admit: 2018-04-16 | Discharge: 2018-04-16 | Disposition: A | Discharge status: Home / Self Care | Payer: Medicare Other | Source: Ambulatory Visit | Attending: Neurosurgery | Admitting: Neurosurgery

## 2018-04-16 DIAGNOSIS — M21371 Foot drop, right foot: Secondary | ICD-10-CM

## 2018-04-16 DIAGNOSIS — M48061 Spinal stenosis, lumbar region without neurogenic claudication: Secondary | ICD-10-CM | POA: Diagnosis not present

## 2018-04-16 DIAGNOSIS — M21372 Foot drop, left foot: Principal | ICD-10-CM

## 2018-04-16 MED ORDER — IOPAMIDOL (ISOVUE-M 200) INJECTION 41%
15.0000 mL | Freq: Once | INTRAMUSCULAR | Status: AC
  Administered 2018-04-16: 15 mL via INTRATHECAL

## 2018-04-16 MED ORDER — DIAZEPAM 5 MG PO TABS
5.0000 mg | ORAL_TABLET | Freq: Once | ORAL | Status: AC
  Administered 2018-04-16: 5 mg via ORAL

## 2018-04-16 NOTE — Discharge Instructions (Signed)

## 2018-05-15 DIAGNOSIS — R030 Elevated blood-pressure reading, without diagnosis of hypertension: Secondary | ICD-10-CM | POA: Diagnosis not present

## 2018-05-15 DIAGNOSIS — M4317 Spondylolisthesis, lumbosacral region: Secondary | ICD-10-CM | POA: Diagnosis not present

## 2018-05-18 ENCOUNTER — Other Ambulatory Visit: Payer: Self-pay | Admitting: Neurosurgery

## 2018-06-01 NOTE — Pre-Procedure Instructions (Signed)
WAKE CONLEE  06/01/2018      [place], [place] [address] Phone: [phone] Fax: [phone]    Your procedure is scheduled on Mon., Dec. 2, 2019 from 11:38AM-4:34PM  Report to [facility] Admitting Entrance "A" at 9:35AM  Call this number if you have problems the morning of surgery:  [phone]   Remember:  Do not eat or drink after midnight on Dec. 1st    Take these medicines the morning of surgery with A SIP OF WATER: LamoTRIgine (LAMICTAL), LevETIRAcetam (KEPPRA), and Pantoprazole (PROTONIX)  If needed: Acetaminophen (TYLENOL)  As of today, stop taking all Other Aspirin Products, Vitamins, Fish oils, and Herbal medications. Also stop all NSAIDS i.e. Advil, Ibuprofen, Motrin, Aleve, Anaprox, Naproxen, BC, Goody Powders, and all Supplements.   Do not wear jewelry.  Do not wear lotions, powders, colognes, or deodorant.  Do not shave 48 hours prior to surgery.  Men may shave face.  Do not bring valuables to the hospital.  [facility] is not responsible for any belongings or valuables.  Contacts, dentures or bridgework may not be worn into surgery.  Leave your suitcase in the car.  After surgery it may be brought to your room.  For patients admitted to the hospital, discharge time will be determined by your treatment team.  Patients discharged the day of surgery will not be allowed to drive home.   Special instructions:  [facility]- Preparing For Surgery  Before surgery, you can play an important role. Because skin is not sterile, your skin needs to be as free of germs as possible. You can reduce the number of germs on your skin by washing with CHG (chlorahexidine gluconate) Soap before surgery.  CHG is an antiseptic cleaner which kills germs and bonds with the skin to continue killing germs even after washing.    Oral Hygiene is also important to reduce your risk  of infection.  Remember - BRUSH YOUR TEETH THE MORNING OF SURGERY WITH YOUR REGULAR TOOTHPASTE  Please do not use if you have an allergy to CHG or antibacterial soaps. If your skin becomes reddened/irritated stop using the CHG.  Do not shave (including legs and underarms) for at least 48 hours prior to first CHG shower. It is OK to shave your face.  Please follow these instructions carefully.   1. Shower the NIGHT BEFORE SURGERY and the MORNING OF SURGERY with CHG.   2. If you chose to wash your hair, wash your hair first as usual with your normal shampoo.  3. After you shampoo, rinse your hair and body thoroughly to remove the shampoo.  4. Use CHG as you would any other liquid soap. You can apply CHG directly to the skin and wash gently with a scrungie or a clean washcloth.   5. Apply the CHG Soap to your body ONLY FROM THE NECK DOWN.  Do not use on open wounds or open sores. Avoid contact with your eyes, ears, mouth and genitals (private parts). Wash Face and genitals (private parts)  with your normal soap.  6. Wash thoroughly, paying special attention to the area where your surgery will be performed.  7. Thoroughly rinse your body with warm water from the neck down.  8. DO NOT shower/wash with your normal soap after using and rinsing off the CHG Soap.  9. Pat yourself dry with a CLEAN TOWEL.  10. Wear CLEAN PAJAMAS  to bed the night before surgery, wear comfortable clothes the morning of surgery  11. Place CLEAN SHEETS on your bed the night of your first shower and DO NOT SLEEP WITH PETS.  Day of Surgery:  Do not apply any deodorants/lotions.  Please wear clean clothes to the hospital/surgery center.   Remember to brush your teeth WITH YOUR REGULAR TOOTHPASTE.  Please read over the following fact sheets that you were given. Pain Booklet, Coughing and Deep Breathing, MRSA Information and Surgical Site Infection Prevention

## 2018-06-04 ENCOUNTER — Encounter (HOSPITAL_COMMUNITY)
Admission: RE | Admit: 2018-06-04 | Discharge: 2018-06-04 | Disposition: A | Discharge status: Home / Self Care | Payer: Medicare Other | Source: Ambulatory Visit | Attending: Neurosurgery | Admitting: Neurosurgery

## 2018-06-04 ENCOUNTER — Other Ambulatory Visit: Payer: Self-pay

## 2018-06-04 ENCOUNTER — Encounter (HOSPITAL_COMMUNITY): Payer: Self-pay

## 2018-06-04 DIAGNOSIS — Z01818 Encounter for other preprocedural examination: Secondary | ICD-10-CM | POA: Insufficient documentation

## 2018-06-04 DIAGNOSIS — R001 Bradycardia, unspecified: Secondary | ICD-10-CM | POA: Diagnosis not present

## 2018-06-04 LAB — CBC
HCT: 49.3 % (ref 39.0–52.0)
Hemoglobin: 15.9 g/dL (ref 13.0–17.0)
MCH: 31.1 pg (ref 26.0–34.0)
MCHC: 32.3 g/dL (ref 30.0–36.0)
MCV: 96.5 fL (ref 80.0–100.0)
Platelets: 222 10*3/uL (ref 150–400)
RBC: 5.11 MIL/uL (ref 4.22–5.81)
RDW: 12.9 % (ref 11.5–15.5)
WBC: 8.9 10*3/uL (ref 4.0–10.5)
nRBC: 0 % (ref 0.0–0.2)

## 2018-06-04 LAB — SURGICAL PCR SCREEN
MRSA, PCR: NEGATIVE
Staphylococcus aureus: NEGATIVE

## 2018-06-04 LAB — BASIC METABOLIC PANEL
Anion gap: 8 (ref 5–15)
BUN: 10 mg/dL (ref 8–23)
CO2: 24 mmol/L (ref 22–32)
Calcium: 9.8 mg/dL (ref 8.9–10.3)
Chloride: 108 mmol/L (ref 98–111)
Creatinine, Ser: 0.99 mg/dL (ref 0.61–1.24)
GFR calc Af Amer: >60 mL/min (ref >60)
GFR calc non Af Amer: >60 mL/min (ref >60)
Glucose, Bld: 111 mg/dL — ABNORMAL HIGH (ref 70–99)
Potassium: 4.4 mmol/L (ref 3.5–5.1)
Sodium: 140 mmol/L (ref 135–145)

## 2018-06-04 NOTE — Progress Notes (Signed)
PCP: Kathyrn Lass  Cardiologist: Wynonia Lawman  DM: denies  SA: denies  Pt denies SOB, chest pain, cough, fever  Pt stated understanding of instructions given for DOS.

## 2018-06-05 LAB — TYPE AND SCREEN
ABO/RH(D): O POS
Antibody Screen: NEGATIVE

## 2018-06-06 ENCOUNTER — Other Ambulatory Visit: Payer: Self-pay | Admitting: Neurosurgery

## 2018-06-10 NOTE — Anesthesia Preprocedure Evaluation (Addendum)
Anesthesia Evaluation  Patient identified by MRN, date of birth, ID band Patient awake    Reviewed: Allergy & Precautions, NPO status , Patient's Chart, lab work & pertinent test results  Airway Mallampati: II  TM Distance: >3 FB Neck ROM: Full    Dental  (+) Teeth Intact, Dental Advisory Given, Caps,    Pulmonary COPD, Current Smoker,    Pulmonary exam normal breath sounds clear to auscultation       Cardiovascular hypertension, Normal cardiovascular exam Rhythm:Regular Rate:Normal     Neuro/Psych Seizures - (last seizure 2006), Well Controlled,  PSYCHIATRIC DISORDERS Anxiety  Neuromuscular disease    GI/Hepatic PUD, GERD  Medicated and Controlled,(+)     substance abuse (H/o EtOH abuse)  ,   Endo/Other  negative endocrine ROS  Renal/GU negative Renal ROS     Musculoskeletal  (+) Arthritis ,   Abdominal   Peds  Hematology negative hematology ROS (+)   Anesthesia Other Findings   Reproductive/Obstetrics                            Anesthesia Physical Anesthesia Plan  ASA: III  Anesthesia Plan: General   Post-op Pain Management:    Induction: Intravenous  PONV Risk Score and Plan: 2 and Dexamethasone, Ondansetron and Midazolam  Airway Management Planned: Oral ETT  Additional Equipment:   Intra-op Plan:   Post-operative Plan: Extubation in OR  Informed Consent: I have reviewed the patients History and Physical, chart, labs and discussed the procedure including the risks, benefits and alternatives for the proposed anesthesia with the patient or authorized representative who has indicated his/her understanding and acceptance.   Dental advisory given  Plan Discussed with: CRNA  Anesthesia Plan Comments:        Anesthesia Quick Evaluation

## 2018-06-11 ENCOUNTER — Inpatient Hospital Stay (HOSPITAL_COMMUNITY): Payer: Medicare Other | Admitting: Anesthesiology

## 2018-06-11 ENCOUNTER — Inpatient Hospital Stay (HOSPITAL_COMMUNITY)
Admission: RE | Disposition: A | Discharge status: Home / Self Care | Payer: Self-pay | Source: Home / Self Care | Attending: Neurosurgery

## 2018-06-11 ENCOUNTER — Inpatient Hospital Stay (HOSPITAL_COMMUNITY)
Admission: RE | Admit: 2018-06-11 | Discharge: 2018-06-12 | DRG: 454 | Disposition: A | Discharge status: Home / Self Care | Payer: Medicare Other | Attending: Neurosurgery | Admitting: Neurosurgery

## 2018-06-11 ENCOUNTER — Inpatient Hospital Stay (HOSPITAL_COMMUNITY): Payer: Medicare Other

## 2018-06-11 ENCOUNTER — Encounter (HOSPITAL_COMMUNITY): Payer: Self-pay | Admitting: Certified Registered Nurse Anesthetist

## 2018-06-11 DIAGNOSIS — I10 Essential (primary) hypertension: Secondary | ICD-10-CM | POA: Diagnosis present

## 2018-06-11 DIAGNOSIS — M5116 Intervertebral disc disorders with radiculopathy, lumbar region: Principal | ICD-10-CM | POA: Diagnosis present

## 2018-06-11 DIAGNOSIS — M4807 Spinal stenosis, lumbosacral region: Secondary | ICD-10-CM | POA: Diagnosis present

## 2018-06-11 DIAGNOSIS — M4317 Spondylolisthesis, lumbosacral region: Secondary | ICD-10-CM | POA: Diagnosis present

## 2018-06-11 DIAGNOSIS — N3289 Other specified disorders of bladder: Secondary | ICD-10-CM | POA: Diagnosis not present

## 2018-06-11 DIAGNOSIS — M48061 Spinal stenosis, lumbar region without neurogenic claudication: Secondary | ICD-10-CM | POA: Diagnosis present

## 2018-06-11 DIAGNOSIS — F17210 Nicotine dependence, cigarettes, uncomplicated: Secondary | ICD-10-CM | POA: Diagnosis present

## 2018-06-11 DIAGNOSIS — M4316 Spondylolisthesis, lumbar region: Secondary | ICD-10-CM | POA: Diagnosis present

## 2018-06-11 DIAGNOSIS — M4697 Unspecified inflammatory spondylopathy, lumbosacral region: Secondary | ICD-10-CM | POA: Diagnosis present

## 2018-06-11 DIAGNOSIS — M1990 Unspecified osteoarthritis, unspecified site: Secondary | ICD-10-CM | POA: Diagnosis present

## 2018-06-11 DIAGNOSIS — Z7983 Long term (current) use of bisphosphonates: Secondary | ICD-10-CM

## 2018-06-11 DIAGNOSIS — Y838 Other surgical procedures as the cause of abnormal reaction of the patient, or of later complication, without mention of misadventure at the time of the procedure: Secondary | ICD-10-CM | POA: Diagnosis present

## 2018-06-11 DIAGNOSIS — J449 Chronic obstructive pulmonary disease, unspecified: Secondary | ICD-10-CM | POA: Diagnosis present

## 2018-06-11 DIAGNOSIS — G40909 Epilepsy, unspecified, not intractable, without status epilepticus: Secondary | ICD-10-CM | POA: Diagnosis present

## 2018-06-11 DIAGNOSIS — Z888 Allergy status to other drugs, medicaments and biological substances status: Secondary | ICD-10-CM

## 2018-06-11 DIAGNOSIS — G629 Polyneuropathy, unspecified: Secondary | ICD-10-CM | POA: Diagnosis present

## 2018-06-11 DIAGNOSIS — M5117 Intervertebral disc disorders with radiculopathy, lumbosacral region: Secondary | ICD-10-CM | POA: Diagnosis present

## 2018-06-11 DIAGNOSIS — Z87442 Personal history of urinary calculi: Secondary | ICD-10-CM | POA: Diagnosis not present

## 2018-06-11 DIAGNOSIS — M549 Dorsalgia, unspecified: Secondary | ICD-10-CM | POA: Diagnosis present

## 2018-06-11 DIAGNOSIS — M960 Pseudarthrosis after fusion or arthrodesis: Secondary | ICD-10-CM | POA: Diagnosis present

## 2018-06-11 DIAGNOSIS — Z8249 Family history of ischemic heart disease and other diseases of the circulatory system: Secondary | ICD-10-CM | POA: Diagnosis not present

## 2018-06-11 DIAGNOSIS — Z882 Allergy status to sulfonamides status: Secondary | ICD-10-CM

## 2018-06-11 DIAGNOSIS — M4327 Fusion of spine, lumbosacral region: Secondary | ICD-10-CM | POA: Diagnosis not present

## 2018-06-11 DIAGNOSIS — Z79899 Other long term (current) drug therapy: Secondary | ICD-10-CM

## 2018-06-11 DIAGNOSIS — M4696 Unspecified inflammatory spondylopathy, lumbar region: Secondary | ICD-10-CM | POA: Diagnosis present

## 2018-06-11 DIAGNOSIS — M8580 Other specified disorders of bone density and structure, unspecified site: Secondary | ICD-10-CM | POA: Diagnosis present

## 2018-06-11 DIAGNOSIS — Z419 Encounter for procedure for purposes other than remedying health state, unspecified: Secondary | ICD-10-CM

## 2018-06-11 SURGERY — POSTERIOR LUMBAR FUSION 2 LEVEL
Anesthesia: General

## 2018-06-11 MED ORDER — CEFAZOLIN SODIUM 1 G IJ SOLR
INTRAMUSCULAR | Status: AC
  Filled 2018-06-11: qty 20

## 2018-06-11 MED ORDER — ROCURONIUM BROMIDE 50 MG/5ML IV SOSY
PREFILLED_SYRINGE | INTRAVENOUS | Status: AC
  Filled 2018-06-11: qty 5

## 2018-06-11 MED ORDER — PROPOFOL 10 MG/ML IV BOLUS
INTRAVENOUS | Status: DC | PRN
  Administered 2018-06-11: 130 mg via INTRAVENOUS

## 2018-06-11 MED ORDER — CEFAZOLIN SODIUM-DEXTROSE 2-4 GM/100ML-% IV SOLN
INTRAVENOUS | Status: AC
  Filled 2018-06-11: qty 100

## 2018-06-11 MED ORDER — DIPHENHYDRAMINE HCL 50 MG/ML IJ SOLN
INTRAMUSCULAR | Status: AC
  Filled 2018-06-11: qty 1

## 2018-06-11 MED ORDER — ROCURONIUM BROMIDE 50 MG/5ML IV SOSY
PREFILLED_SYRINGE | INTRAVENOUS | Status: DC | PRN
  Administered 2018-06-11: 50 mg via INTRAVENOUS
  Administered 2018-06-11: 30 mg via INTRAVENOUS
  Administered 2018-06-11: 50 mg via INTRAVENOUS
  Administered 2018-06-11: 10 mg via INTRAVENOUS
  Administered 2018-06-11: 50 mg via INTRAVENOUS

## 2018-06-11 MED ORDER — VANCOMYCIN HCL 1000 MG IV SOLR
INTRAVENOUS | Status: AC
  Filled 2018-06-11: qty 1000

## 2018-06-11 MED ORDER — SODIUM CHLORIDE 0.9 % IV SOLN
INTRAVENOUS | Status: DC | PRN
  Administered 2018-06-11: 08:00:00

## 2018-06-11 MED ORDER — MIDAZOLAM HCL 5 MG/5ML IJ SOLN
INTRAMUSCULAR | Status: DC | PRN
  Administered 2018-06-11: 2 mg via INTRAVENOUS

## 2018-06-11 MED ORDER — DOCUSATE SODIUM 100 MG PO CAPS
100.0000 mg | ORAL_CAPSULE | Freq: Two times a day (BID) | ORAL | Status: DC
  Administered 2018-06-11 – 2018-06-12 (×3): 100 mg via ORAL
  Filled 2018-06-11 (×3): qty 1

## 2018-06-11 MED ORDER — CYCLOBENZAPRINE HCL 10 MG PO TABS
ORAL_TABLET | ORAL | Status: AC
  Filled 2018-06-11: qty 1

## 2018-06-11 MED ORDER — BACITRACIN ZINC 500 UNIT/GM EX OINT
TOPICAL_OINTMENT | CUTANEOUS | Status: DC | PRN
  Administered 2018-06-11: 1 via TOPICAL

## 2018-06-11 MED ORDER — DEXAMETHASONE SODIUM PHOSPHATE 10 MG/ML IJ SOLN
INTRAMUSCULAR | Status: AC
  Filled 2018-06-11: qty 1

## 2018-06-11 MED ORDER — OXYCODONE HCL 5 MG PO TABS
ORAL_TABLET | ORAL | Status: AC
  Filled 2018-06-11: qty 1

## 2018-06-11 MED ORDER — EPHEDRINE SULFATE-NACL 50-0.9 MG/10ML-% IV SOSY
PREFILLED_SYRINGE | INTRAVENOUS | Status: DC | PRN
  Administered 2018-06-11: 10 mg via INTRAVENOUS

## 2018-06-11 MED ORDER — LIDOCAINE 2% (20 MG/ML) 5 ML SYRINGE
INTRAMUSCULAR | Status: AC
  Filled 2018-06-11: qty 5

## 2018-06-11 MED ORDER — TAMSULOSIN HCL 0.4 MG PO CAPS
0.4000 mg | ORAL_CAPSULE | Freq: Every day | ORAL | Status: DC
  Administered 2018-06-11: 0.4 mg via ORAL
  Filled 2018-06-11: qty 1

## 2018-06-11 MED ORDER — 0.9 % SODIUM CHLORIDE (POUR BTL) OPTIME
TOPICAL | Status: DC | PRN
  Administered 2018-06-11: 1000 mL

## 2018-06-11 MED ORDER — PANTOPRAZOLE SODIUM 40 MG PO TBEC
40.0000 mg | DELAYED_RELEASE_TABLET | Freq: Every day | ORAL | Status: DC
  Administered 2018-06-12: 40 mg via ORAL
  Filled 2018-06-11: qty 1

## 2018-06-11 MED ORDER — ONDANSETRON HCL 4 MG/2ML IJ SOLN
INTRAMUSCULAR | Status: AC
  Filled 2018-06-11: qty 2

## 2018-06-11 MED ORDER — THROMBIN 5000 UNITS EX SOLR
CUTANEOUS | Status: AC
  Filled 2018-06-11: qty 5000

## 2018-06-11 MED ORDER — THROMBIN 20000 UNITS EX SOLR
CUTANEOUS | Status: DC | PRN
  Administered 2018-06-11: 10:00:00

## 2018-06-11 MED ORDER — DEXAMETHASONE SODIUM PHOSPHATE 10 MG/ML IJ SOLN
INTRAMUSCULAR | Status: DC | PRN
  Administered 2018-06-11: 10 mg via INTRAVENOUS

## 2018-06-11 MED ORDER — ACETAMINOPHEN 650 MG RE SUPP
650.0000 mg | RECTAL | Status: DC | PRN
  Filled 2018-06-11: qty 1

## 2018-06-11 MED ORDER — SODIUM CHLORIDE 0.9 % IV SOLN: 250.0000 mL | INTRAVENOUS | Status: DC

## 2018-06-11 MED ORDER — BACITRACIN ZINC 500 UNIT/GM EX OINT
TOPICAL_OINTMENT | CUTANEOUS | Status: AC
  Filled 2018-06-11: qty 28.35

## 2018-06-11 MED ORDER — PHENYLEPHRINE 40 MCG/ML (10ML) SYRINGE FOR IV PUSH (FOR BLOOD PRESSURE SUPPORT)
PREFILLED_SYRINGE | INTRAVENOUS | Status: AC
  Filled 2018-06-11: qty 10

## 2018-06-11 MED ORDER — ACETAMINOPHEN 325 MG PO TABS: 650.0000 mg | ORAL_TABLET | ORAL | Status: DC | PRN

## 2018-06-11 MED ORDER — ROCURONIUM BROMIDE 50 MG/5ML IV SOSY
PREFILLED_SYRINGE | INTRAVENOUS | Status: AC
  Filled 2018-06-11: qty 10

## 2018-06-11 MED ORDER — LAMOTRIGINE 25 MG PO TABS
50.0000 mg | ORAL_TABLET | Freq: Two times a day (BID) | ORAL | Status: DC
  Administered 2018-06-11 – 2018-06-12 (×2): 50 mg via ORAL
  Filled 2018-06-11 (×2): qty 2

## 2018-06-11 MED ORDER — PHENOL 1.4 % MT LIQD: 1.0000 | OROMUCOSAL | Status: DC | PRN

## 2018-06-11 MED ORDER — LIDOCAINE-EPINEPHRINE 1 %-1:100000 IJ SOLN
INTRAMUSCULAR | Status: AC
  Filled 2018-06-11: qty 1

## 2018-06-11 MED ORDER — SODIUM CHLORIDE 0.9% FLUSH: 3.0000 mL | INTRAVENOUS | Status: DC | PRN

## 2018-06-11 MED ORDER — CEFAZOLIN SODIUM-DEXTROSE 2-4 GM/100ML-% IV SOLN
2.0000 g | INTRAVENOUS | Status: AC
  Administered 2018-06-11 (×2): 2 g via INTRAVENOUS

## 2018-06-11 MED ORDER — FENTANYL CITRATE (PF) 250 MCG/5ML IJ SOLN
INTRAMUSCULAR | Status: AC
  Filled 2018-06-11: qty 5

## 2018-06-11 MED ORDER — BISACODYL 10 MG RE SUPP: 10.0000 mg | Freq: Every day | RECTAL | Status: DC | PRN

## 2018-06-11 MED ORDER — BUPIVACAINE LIPOSOME 1.3 % IJ SUSP
20.0000 mL | INTRAMUSCULAR | Status: AC
  Administered 2018-06-11: 20 mL
  Filled 2018-06-11: qty 20

## 2018-06-11 MED ORDER — MORPHINE SULFATE (PF) 4 MG/ML IV SOLN
4.0000 mg | INTRAVENOUS | Status: DC | PRN
  Filled 2018-06-11: qty 1

## 2018-06-11 MED ORDER — CHLORHEXIDINE GLUCONATE CLOTH 2 % EX PADS: 6.0000 | MEDICATED_PAD | Freq: Once | CUTANEOUS | Status: DC

## 2018-06-11 MED ORDER — EPHEDRINE 5 MG/ML INJ
INTRAVENOUS | Status: AC
  Filled 2018-06-11: qty 10

## 2018-06-11 MED ORDER — ONDANSETRON HCL 4 MG/2ML IJ SOLN: 4.0000 mg | Freq: Four times a day (QID) | INTRAMUSCULAR | Status: DC | PRN

## 2018-06-11 MED ORDER — FENTANYL CITRATE (PF) 100 MCG/2ML IJ SOLN
INTRAMUSCULAR | Status: DC | PRN
  Administered 2018-06-11 (×7): 50 ug via INTRAVENOUS
  Administered 2018-06-11: 150 ug via INTRAVENOUS

## 2018-06-11 MED ORDER — ACETAMINOPHEN 500 MG PO TABS
1000.0000 mg | ORAL_TABLET | Freq: Four times a day (QID) | ORAL | Status: AC
  Administered 2018-06-11 – 2018-06-12 (×3): 1000 mg via ORAL
  Filled 2018-06-11 (×3): qty 2

## 2018-06-11 MED ORDER — MIDAZOLAM HCL 2 MG/2ML IJ SOLN
INTRAMUSCULAR | Status: AC
  Filled 2018-06-11: qty 2

## 2018-06-11 MED ORDER — CEFAZOLIN SODIUM-DEXTROSE 2-4 GM/100ML-% IV SOLN
2.0000 g | Freq: Three times a day (TID) | INTRAVENOUS | Status: AC
  Administered 2018-06-11 – 2018-06-12 (×2): 2 g via INTRAVENOUS
  Filled 2018-06-11 (×2): qty 100

## 2018-06-11 MED ORDER — BUPIVACAINE-EPINEPHRINE (PF) 0.5% -1:200000 IJ SOLN
INTRAMUSCULAR | Status: DC | PRN
  Administered 2018-06-11: 10 mL

## 2018-06-11 MED ORDER — VANCOMYCIN HCL 1 G IV SOLR
INTRAVENOUS | Status: DC | PRN
  Administered 2018-06-11: 1000 mg

## 2018-06-11 MED ORDER — OXYCODONE HCL 5 MG PO TABS
10.0000 mg | ORAL_TABLET | ORAL | Status: DC | PRN
  Administered 2018-06-11 – 2018-06-12 (×2): 10 mg via ORAL
  Filled 2018-06-11 (×2): qty 2

## 2018-06-11 MED ORDER — OXYCODONE HCL 5 MG PO TABS
5.0000 mg | ORAL_TABLET | ORAL | Status: DC | PRN
  Administered 2018-06-11 (×2): 5 mg via ORAL
  Filled 2018-06-11: qty 1

## 2018-06-11 MED ORDER — LACTATED RINGERS IV SOLN
INTRAVENOUS | Status: DC | PRN
  Administered 2018-06-11 (×2): via INTRAVENOUS

## 2018-06-11 MED ORDER — THROMBIN 5000 UNITS EX SOLR
OROMUCOSAL | Status: DC | PRN
  Administered 2018-06-11 (×2)

## 2018-06-11 MED ORDER — ONDANSETRON HCL 4 MG/2ML IJ SOLN
INTRAMUSCULAR | Status: DC | PRN
  Administered 2018-06-11: 4 mg via INTRAVENOUS

## 2018-06-11 MED ORDER — FOLIC ACID 1 MG PO TABS
1.0000 mg | ORAL_TABLET | Freq: Every day | ORAL | Status: DC
  Administered 2018-06-11 – 2018-06-12 (×2): 1 mg via ORAL
  Filled 2018-06-11 (×2): qty 1

## 2018-06-11 MED ORDER — SODIUM CHLORIDE 0.9 % IV SOLN
INTRAVENOUS | Status: DC | PRN
  Administered 2018-06-11: 25 ug/min via INTRAVENOUS
  Administered 2018-06-11: 60 ug/min via INTRAVENOUS

## 2018-06-11 MED ORDER — MENTHOL 3 MG MT LOZG: 1.0000 | LOZENGE | OROMUCOSAL | Status: DC | PRN

## 2018-06-11 MED ORDER — LIDOCAINE 2% (20 MG/ML) 5 ML SYRINGE
INTRAMUSCULAR | Status: DC | PRN
  Administered 2018-06-11: 100 mg via INTRAVENOUS

## 2018-06-11 MED ORDER — PHENYLEPHRINE 40 MCG/ML (10ML) SYRINGE FOR IV PUSH (FOR BLOOD PRESSURE SUPPORT)
PREFILLED_SYRINGE | INTRAVENOUS | Status: DC | PRN
  Administered 2018-06-11: 80 ug via INTRAVENOUS
  Administered 2018-06-11: 40 ug via INTRAVENOUS
  Administered 2018-06-11 (×3): 80 ug via INTRAVENOUS

## 2018-06-11 MED ORDER — SUGAMMADEX SODIUM 200 MG/2ML IV SOLN
INTRAVENOUS | Status: DC | PRN
  Administered 2018-06-11: 136 mg via INTRAVENOUS

## 2018-06-11 MED ORDER — ADULT MULTIVITAMIN W/MINERALS CH
1.0000 | ORAL_TABLET | Freq: Every day | ORAL | Status: DC
  Administered 2018-06-11 – 2018-06-12 (×2): 1 via ORAL
  Filled 2018-06-11 (×5): qty 1

## 2018-06-11 MED ORDER — FENTANYL CITRATE (PF) 100 MCG/2ML IJ SOLN
25.0000 ug | INTRAMUSCULAR | Status: DC | PRN
  Administered 2018-06-11: 50 ug via INTRAVENOUS

## 2018-06-11 MED ORDER — CYCLOBENZAPRINE HCL 10 MG PO TABS
10.0000 mg | ORAL_TABLET | Freq: Three times a day (TID) | ORAL | Status: DC | PRN
  Administered 2018-06-11 – 2018-06-12 (×2): 10 mg via ORAL
  Filled 2018-06-11: qty 1

## 2018-06-11 MED ORDER — ONDANSETRON HCL 4 MG PO TABS: 4.0000 mg | ORAL_TABLET | Freq: Four times a day (QID) | ORAL | Status: DC | PRN

## 2018-06-11 MED ORDER — GLYCOPYRROLATE PF 0.2 MG/ML IJ SOSY
PREFILLED_SYRINGE | INTRAMUSCULAR | Status: DC | PRN
  Administered 2018-06-11: .2 mg via INTRAVENOUS

## 2018-06-11 MED ORDER — FENTANYL CITRATE (PF) 100 MCG/2ML IJ SOLN
INTRAMUSCULAR | Status: AC
  Filled 2018-06-11: qty 2

## 2018-06-11 MED ORDER — ONDANSETRON HCL 4 MG/2ML IJ SOLN: 4.0000 mg | Freq: Once | INTRAMUSCULAR | Status: DC | PRN

## 2018-06-11 MED ORDER — SODIUM CHLORIDE 0.9% FLUSH
3.0000 mL | Freq: Two times a day (BID) | INTRAVENOUS | Status: DC
  Administered 2018-06-11: 3 mL via INTRAVENOUS

## 2018-06-11 MED ORDER — SODIUM CHLORIDE (PF) 0.9 % IJ SOLN
INTRAMUSCULAR | Status: AC
  Filled 2018-06-11: qty 10

## 2018-06-11 MED ORDER — LEVETIRACETAM 500 MG PO TABS
500.0000 mg | ORAL_TABLET | Freq: Two times a day (BID) | ORAL | Status: DC
  Administered 2018-06-11 – 2018-06-12 (×2): 500 mg via ORAL
  Filled 2018-06-11 (×2): qty 1

## 2018-06-11 MED ORDER — ATORVASTATIN CALCIUM 10 MG PO TABS
10.0000 mg | ORAL_TABLET | Freq: Every day | ORAL | Status: DC
  Administered 2018-06-11: 10 mg via ORAL
  Filled 2018-06-11: qty 1

## 2018-06-11 MED ORDER — THROMBIN 20000 UNITS EX SOLR
CUTANEOUS | Status: AC
  Filled 2018-06-11: qty 20000

## 2018-06-11 SURGICAL SUPPLY — 77 items
ADH SKN CLS APL DERMABOND .7 (GAUZE/BANDAGES/DRESSINGS) ×1
APL SKNCLS STERI-STRIP NONHPOA (GAUZE/BANDAGES/DRESSINGS) ×1
BAG DECANTER FOR FLEXI CONT (MISCELLANEOUS) ×3 IMPLANT
BASKET BONE COLLECTION (BASKET) ×2 IMPLANT
BENZOIN TINCTURE PRP APPL 2/3 (GAUZE/BANDAGES/DRESSINGS) ×3 IMPLANT
BLADE CLIPPER SURG (BLADE) IMPLANT
BUR MATCHSTICK NEURO 3.0 LAGG (BURR) ×3 IMPLANT
BUR PRECISION FLUTE 6.0 (BURR) ×3 IMPLANT
CAGE ALTERA 10X31X9-13 15D (Cage) ×1 IMPLANT
CAGE ALTERA 9-13-15-31MM (Cage) ×1 IMPLANT
CANISTER SUCT 3000ML PPV (MISCELLANEOUS) ×3 IMPLANT
CAP REVERE LOCKING (Cap) ×16 IMPLANT
CARTRIDGE OIL MAESTRO DRILL (MISCELLANEOUS) ×1 IMPLANT
CLOSURE WOUND 1/2 X4 (GAUZE/BANDAGES/DRESSINGS) ×1
CONT SPEC 4OZ CLIKSEAL STRL BL (MISCELLANEOUS) ×3 IMPLANT
COVER BACK TABLE 60X90IN (DRAPES) ×3 IMPLANT
COVER WAND RF STERILE (DRAPES) ×3 IMPLANT
DECANTER SPIKE VIAL GLASS SM (MISCELLANEOUS) ×3 IMPLANT
DERMABOND ADVANCED (GAUZE/BANDAGES/DRESSINGS) ×2
DERMABOND ADVANCED .7 DNX12 (GAUZE/BANDAGES/DRESSINGS) IMPLANT
DIFFUSER DRILL AIR PNEUMATIC (MISCELLANEOUS) ×3 IMPLANT
DRAPE C-ARM 42X72 X-RAY (DRAPES) ×6 IMPLANT
DRAPE HALF SHEET 40X57 (DRAPES) ×3 IMPLANT
DRAPE LAPAROTOMY 100X72X124 (DRAPES) ×3 IMPLANT
DRAPE SURG 17X23 STRL (DRAPES) ×12 IMPLANT
DRSG OPSITE POSTOP 4X6 (GAUZE/BANDAGES/DRESSINGS) ×2 IMPLANT
ELECT BLADE 4.0 EZ CLEAN MEGAD (MISCELLANEOUS) ×3
ELECT REM PT RETURN 9FT ADLT (ELECTROSURGICAL) ×3
ELECTRODE BLDE 4.0 EZ CLN MEGD (MISCELLANEOUS) ×1 IMPLANT
ELECTRODE REM PT RTRN 9FT ADLT (ELECTROSURGICAL) ×1 IMPLANT
GAUZE 4X4 16PLY RFD (DISPOSABLE) ×3 IMPLANT
GAUZE SPONGE 4X4 12PLY STRL (GAUZE/BANDAGES/DRESSINGS) ×3 IMPLANT
GLOVE BIO SURGEON STRL SZ 6.5 (GLOVE) ×4 IMPLANT
GLOVE BIO SURGEON STRL SZ8 (GLOVE) ×10 IMPLANT
GLOVE BIO SURGEON STRL SZ8.5 (GLOVE) ×10 IMPLANT
GLOVE BIO SURGEONS STRL SZ 6.5 (GLOVE) ×4
GLOVE BIOGEL PI IND STRL 6.5 (GLOVE) IMPLANT
GLOVE BIOGEL PI INDICATOR 6.5 (GLOVE) ×8
GLOVE ECLIPSE 9.0 STRL (GLOVE) ×2 IMPLANT
GOWN STRL REUS W/ TWL LRG LVL3 (GOWN DISPOSABLE) IMPLANT
GOWN STRL REUS W/ TWL XL LVL3 (GOWN DISPOSABLE) ×2 IMPLANT
GOWN STRL REUS W/TWL 2XL LVL3 (GOWN DISPOSABLE) IMPLANT
GOWN STRL REUS W/TWL LRG LVL3 (GOWN DISPOSABLE) ×6
GOWN STRL REUS W/TWL XL LVL3 (GOWN DISPOSABLE) ×9
HEMOSTAT POWDER KIT SURGIFOAM (HEMOSTASIS) ×5 IMPLANT
KIT BASIN OR (CUSTOM PROCEDURE TRAY) ×3 IMPLANT
KIT INFUSE X SMALL 1.4CC (Orthopedic Implant) ×2 IMPLANT
KIT TURNOVER KIT B (KITS) ×3 IMPLANT
MILL MEDIUM DISP (BLADE) ×3 IMPLANT
NDL HYPO 21X1.5 SAFETY (NEEDLE) IMPLANT
NEEDLE HYPO 21X1.5 SAFETY (NEEDLE) IMPLANT
NEEDLE HYPO 22GX1.5 SAFETY (NEEDLE) ×3 IMPLANT
NS IRRIG 1000ML POUR BTL (IV SOLUTION) ×3 IMPLANT
OIL CARTRIDGE MAESTRO DRILL (MISCELLANEOUS) ×3
PACK LAMINECTOMY NEURO (CUSTOM PROCEDURE TRAY) ×3 IMPLANT
PAD ARMBOARD 7.5X6 YLW CONV (MISCELLANEOUS) ×9 IMPLANT
PATTIES SURGICAL .5 X1 (DISPOSABLE) IMPLANT
PATTIES SURGICAL 1X1 (DISPOSABLE) ×2 IMPLANT
ROD CURVED 100MM (Rod) ×4 IMPLANT
SCREW 7.5X45MM (Screw) ×4 IMPLANT
SCREW 7.5X50MM (Screw) ×4 IMPLANT
SEALANT ADHERUS EXTEND TIP (MISCELLANEOUS) ×2 IMPLANT
SPACER ALTERA 10X31 8-12MM-8 (Spacer) ×2 IMPLANT
SPONGE LAP 4X18 RFD (DISPOSABLE) IMPLANT
SPONGE NEURO XRAY DETECT 1X3 (DISPOSABLE) IMPLANT
SPONGE SURGIFOAM ABS GEL 100 (HEMOSTASIS) ×2 IMPLANT
STRIP BIOACTIVE 10CC 25X50X8 (Miscellaneous) ×4 IMPLANT
STRIP CLOSURE SKIN 1/2X4 (GAUZE/BANDAGES/DRESSINGS) ×2 IMPLANT
SUT PROLENE 6 0 BV (SUTURE) ×2 IMPLANT
SUT VIC AB 1 CT1 18XBRD ANBCTR (SUTURE) ×2 IMPLANT
SUT VIC AB 1 CT1 8-18 (SUTURE) ×9
SUT VIC AB 2-0 CP2 18 (SUTURE) ×6 IMPLANT
SYR 20CC LL (SYRINGE) IMPLANT
TOWEL GREEN STERILE (TOWEL DISPOSABLE) ×3 IMPLANT
TOWEL GREEN STERILE FF (TOWEL DISPOSABLE) ×3 IMPLANT
TRAY FOLEY MTR SLVR 16FR STAT (SET/KITS/TRAYS/PACK) ×3 IMPLANT
WATER STERILE IRR 1000ML POUR (IV SOLUTION) ×3 IMPLANT

## 2018-06-11 NOTE — Op Note (Signed)
Brief history: The patient is a 67 year old white male whose had multiple lumbar surgeries including a lumbar fusion at L4-5.  He has developed recurrent back and leg pain numbness tingling weakness.  He has failed medical management.  He was worked up with a lumbar myelo CT which demonstrated spondylolisthesis at L3-4 and L5-S1 as well as degenerative disc disease and stenosis.  I discussed the various treatment options with him including surgery.  He has weighed the risks, benefits and alternatives surgery and decided proceed with an exploration with lumbar fusion as well as an L3-4 and L5-S1 decompression, instrumentation and fusion.  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 Etc. requiring a wide decompression of the nerve roots.);  L3-4 and L5-S1 transforaminal lumbar interbody fusion with local morselized autograft bone and Kinnex graft extender; insertion of interbody prosthesis at L3-4 and L5-S1 (globus peek expandable interbody prosthesis); posterior segmental instrumentation from L3 to S1 with globus titanium pedicle screws and rods; posterior lateral arthrodesis at L3-4, L4-5 and L5-S1 with local morselized autograft bone and Kinnex bone graft extender; exploration of lumbar fusion/removal of lumbar hardware.  Surgeon: Dr. Earle Gell  Asst.: Arnetha Massy nurse practitioner  Anesthesia: Gen. endotracheal  Estimated blood loss: 350 cc  Drains: None  Complications:  Durotomy  Description of procedure: The patient was brought to the operating room by the anesthesia team. General endotracheal anesthesia was induced. The patient was turned to the prone position on the Wilson frame. The patient's lumbosacral region was then prepared with Betadine scrub and Betadine solution. Sterile drapes were applied.  I then injected the area to be incised with Marcaine with epinephrine solution. I then used the scalpel to make a linear midline incision over the L3-4, L4-5 and L5-S1 interspace, incising through the old surgical scar. I then used electrocautery to perform a bilateral subperiosteal dissection exposing the spinous process and lamina of L3-S1 and exposing the old hardware at L4-5.  We explored the arthrodesis by removing the caps from the screws at L4-5 and removing the rods.  The screws were not loose but I did not see any bony evidence of a posterior lateral.  I therefore decided to repeat the posterior lateral arthrodesis at L4-5.  I began the decompression by using the high speed drill to perform laminotomies at L3-4 and L5-S1 bilaterally. We then used the Kerrison punches to widen the laminotomy and removed the ligamentum flavum at L3-4 and L5-S1 bilaterally.  We encountered scar tissue at L5-S1 on the left and he had a previous left L5 hemilaminectomy.. We used the Kerrison punches to remove the medial facets at L3-4 and L5-S1 bilaterally. We performed wide foraminotomies about the bilateral L3, L4, L5 and S1 nerve roots completing the decompression.  We now turned our attention to the posterior lumbar interbody fusion. I used a scalpel to incise the intervertebral disc at L3-4 and L5-S1 bilaterally.  The disc space was quite collapsed.. I then performed a partial intervertebral discectomy at L3-4 and L5-S1 bilaterally using the pituitary forceps. We prepared the vertebral endplates at G4-0 and N0-U7 bilaterally  for the fusion by removing the soft tissues with the  curettes. We then used the trial spacers to pick the appropriate sized interbody prosthesis. We prefilled his prosthesis with a combination of local morselized autograft bone that we obtained during the decompression as well as Kinnex bone graft extender. We inserted the prefilled prosthesis into the interspace at L3-4 and L5-S1 from the right, we then turned and expanded the prosthesis. There was a good snug fit of the prosthesis in the interspace. We then filled and the remainder of the intervertebral disc space with local morselized autograft bone and Kinnex. This completed the posterior lumbar interbody arthrodesis.  While inserting the prosthesis we created a durotomy at the axilla of the L3 nerve root.  I repaired this with a figure-of-eight 6-0 Prolene suture.  We now turned attention to the instrumentation. Under fluoroscopic guidance we cannulated the bilateral L3 and S1 pedicles with the bone probe. We then removed the bone probe. We then tapped the pedicle with a 6.5 millimeter tap. We then removed the tap. We probed inside the tapped pedicle with a ball probe to rule out cortical breaches. We then inserted a 7.5 x 45 and 50 millimeter pedicle screw into the L3 and S1 pedicles bilaterally under fluoroscopic guidance. We then palpated along the medial aspect of the pedicles to rule out cortical breaches. There were none. The nerve roots were not injured. We then connected the unilateral pedicle screws with a lordotic rod. We compressed the construct and secured the rod in place with the caps. We then tightened the caps appropriately. This completed the instrumentation from L3-S1 bilaterally.  We now turned our attention to the posterior lateral arthrodesis at L3-4, L4-5 and L5-S1. We used the high-speed drill to decorticate the remainder of the facets, pars, transverse process at L3-4, L4-5 and L5-S1. We then applied a combination of local morselized autograft bone and Kinnex bone graft extender over  these decorticated posterior lateral structures. This completed the posterior lateral arthrodesis.  We then obtained hemostasis using bipolar electrocautery. We irrigated the wound out with bacitracin solution. We inspected the thecal sac and nerve roots and noted they were well decompressed.  I placed DuraSeal over the repaired durotomy site.  We then removed the retractor. We placed vancomycin powder in the wound.  We injected Exparel . We reapproximated patient's thoracolumbar fascia with interrupted #1 Vicryl suture. We reapproximated patient's subcutaneous tissue with interrupted 2-0 Vicryl suture. The reapproximated patient's skin with Steri-Strips and benzoin. The wound was then coated with bacitracin ointment. A sterile dressing was applied. The drapes were removed. The patient was subsequently returned to the supine position where they were extubated by the anesthesia team. He was then transported to the post anesthesia care unit in stable condition. All sponge instrument and needle counts were reportedly correct at the end of this case.

## 2018-06-11 NOTE — Plan of Care (Signed)
  Problem: Education: Goal: Ability to verbalize activity precautions or restrictions will improve Outcome: Progressing Goal: Knowledge of the prescribed therapeutic regimen will improve Outcome: Progressing Goal: Understanding of discharge needs will improve Outcome: Progressing   Problem: Pain Management: Goal: Pain level will decrease Outcome: Progressing   Problem: Bladder/Genitourinary: Goal: Urinary functional status for postoperative course will improve Outcome: Progressing   Problem: Pain Managment: Goal: General experience of comfort will improve Outcome: Progressing

## 2018-06-11 NOTE — Progress Notes (Signed)
Patient not voided post FC removal. Noted bladder distention ,patient complained of abdominal fulness. Bladder scan done,548ml. In and cath was performed, drained 562ml.patient verbalized relief.

## 2018-06-11 NOTE — Anesthesia Postprocedure Evaluation (Signed)
Anesthesia Post Note  Patient: ZEEV DEAKINS  Procedure(s) Performed: POSTERIOR LUMBAR INTERBODY FUSION, INTERBODY PROSTHESIS, INSTRUMENTATION LUMBAR THREE- LUMBAR FOUR, LUMBAR FIVE- SACRAL ONE (N/A )     Patient location during evaluation: PACU Anesthesia Type: General Level of consciousness: awake and alert, awake and oriented Pain management: pain level controlled Vital Signs Assessment: post-procedure vital signs reviewed and stable Respiratory status: spontaneous breathing, nonlabored ventilation and respiratory function stable Cardiovascular status: blood pressure returned to baseline and stable Postop Assessment: no apparent nausea or vomiting Anesthetic complications: no    Last Vitals:  Vitals:   06/11/18 1315 06/11/18 1342  BP: 118/64 (!) 120/57  Pulse: 73 (!) 56  Resp: 10 20  Temp: (!) 36.3 C 36.4 C  SpO2: 93% 95%    Last Pain:  Vitals:   06/11/18 1544  TempSrc:   PainSc: 7                  Catalina Gravel
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
## 2018-06-11 NOTE — Anesthesia Procedure Notes (Signed)
Procedure Name: Intubation Date/Time: 06/11/2018 7:35 AM Performed by: Alain Marion, CRNA Pre-anesthesia Checklist: Patient identified, Emergency Drugs available, Suction available and Patient being monitored Patient Re-evaluated:Patient Re-evaluated prior to induction Oxygen Delivery Method: Circle System Utilized Preoxygenation: Pre-oxygenation with 100% oxygen Induction Type: IV induction Ventilation: Mask ventilation without difficulty Laryngoscope Size: 2 Grade View: Grade I Tube type: Oral Tube size: 7.5 mm Number of attempts: 1 Airway Equipment and Method: Stylet and Oral airway Placement Confirmation: ETT inserted through vocal cords under direct vision,  positive ETCO2 and breath sounds checked- equal and bilateral Secured at: 24 cm Tube secured with: Tape Dental Injury: Teeth and Oropharynx as per pre-operative assessment

## 2018-06-11 NOTE — Transfer of Care (Signed)
Immediate Anesthesia Transfer of Care Note  Patient: Jeffrey Castillo  Procedure(s) Performed: POSTERIOR LUMBAR INTERBODY FUSION, INTERBODY PROSTHESIS, INSTRUMENTATION LUMBAR THREE- LUMBAR FOUR, LUMBAR FIVE- SACRAL ONE (N/A )  Patient Location: PACU  Anesthesia Type:General  Level of Consciousness: awake, alert  and oriented  Airway & Oxygen Therapy: Patient Spontanous Breathing and Patient connected to nasal cannula oxygen  Post-op Assessment: Report given to RN and Post -op Vital signs reviewed and stable  Post vital signs: Reviewed and stable  Last Vitals:  Vitals Value Taken Time  BP 151/57 06/11/2018 12:15 PM  Temp 36.3 C 06/11/2018 12:15 PM  Pulse 67 06/11/2018 12:16 PM  Resp 16 06/11/2018 12:16 PM  SpO2 96 % 06/11/2018 12:16 PM  Vitals shown include unvalidated device data.  Last Pain:  Vitals:   06/11/18 1215  PainSc: (P) Asleep         Complications: No apparent anesthesia complications

## 2018-06-12 ENCOUNTER — Other Ambulatory Visit: Payer: Self-pay

## 2018-06-12 MED ORDER — DOCUSATE SODIUM 100 MG PO CAPS: 100.0000 mg | ORAL_CAPSULE | Freq: Two times a day (BID) | ORAL | 0 refills | Status: DC

## 2018-06-12 MED ORDER — TAMSULOSIN HCL 0.4 MG PO CAPS: 0.4000 mg | ORAL_CAPSULE | Freq: Every day | ORAL | 0 refills | Status: DC

## 2018-06-12 MED ORDER — CYCLOBENZAPRINE HCL 10 MG PO TABS: 10.0000 mg | ORAL_TABLET | Freq: Three times a day (TID) | ORAL | 1 refills | Status: DC | PRN

## 2018-06-12 MED ORDER — OXYCODONE HCL 5 MG PO TABS: 5.0000 mg | ORAL_TABLET | ORAL | 0 refills | Status: DC | PRN

## 2018-06-12 MED FILL — Gelatin Absorbable MT Powder: OROMUCOSAL | Qty: 1 | Status: AC

## 2018-06-12 MED FILL — Heparin Sodium (Porcine) Inj 1000 Unit/ML: INTRAMUSCULAR | Qty: 30 | Status: AC

## 2018-06-12 MED FILL — Sodium Chloride IV Soln 0.9%: INTRAVENOUS | Qty: 1000 | Status: AC

## 2018-06-12 MED FILL — Thrombin For Soln 5000 Unit: CUTANEOUS | Qty: 5000 | Status: AC

## 2018-06-12 NOTE — Discharge Instructions (Signed)
Wound Care °Leave incision open to air. °You may shower. °Do not scrub directly on incision.  °Do not put any creams, lotions, or ointments on incision. °Activity °Walk each and every day, increasing distance each day. °No lifting greater than 5 lbs.  Avoid bending, arching, and twisting. °No driving for 2 weeks; may ride as a passenger locally. °If provided with back brace, wear when out of bed.  It is not necessary to wear in bed. °Diet °Resume your normal diet.  °Return to Work °Will be discussed at you follow up appointment. °Call Your Doctor If Any of These Occur °Redness, drainage, or swelling at the wound.  °Temperature greater than 101 degrees. °Severe pain not relieved by pain medication. °Incision starts to come apart. °Follow Up Appt °Call today for appointment in 1-2 weeks ([phone]) or for problems.  If you have any hardware placed in your spine, you will need an x-ray before your appointment. °

## 2018-06-12 NOTE — Discharge Summary (Signed)
Physician Discharge Summary  Patient ID: Jeffrey Castillo MRN: [id number] DOB/AGE: 1951-01-31 67 y.o.  Admit date: 06/11/2018 Discharge date: 06/12/2018  Admission Diagnoses: Lumbar spondylolisthesis, degenerative disc disease, spinal stenosis, lumbago, lumbar radiculopathy  Discharge Diagnoses: The same Active Problems:   Spondylolisthesis of lumbar region   Discharged Condition: good  Hospital Course: I performed an L3-4, L4-5 and L5-S1 decompression, instrumentation and fusion on the patient on 06/11/2018.  The patient's postoperative course was unremarkable.  On postoperative day #1 he requested discharge home.  He was given written and oral discharge instructions.  All his questions were answered.  Consults: Physical therapy Significant Diagnostic Studies: None Treatments: Exploration of lumbar fusion/removal of lumbar hardware, L3-4, L4-5 and L5-S1 decompression, instrumentation and fusion. Discharge Exam: Blood pressure (!) 150/63, pulse 61, temperature 98.9 F (37.2 C), temperature source Oral, resp. rate 18, height 5\' 7"  (1.702 m), weight 68 kg, SpO2 97 %. The patient is alert and pleasant.  He looks well.  His wound has a small amount of blood staining.  He has no headache.  His strength is normal in his lower extremities.  Disposition: Home  Discharge Instructions    Call MD for:  difficulty breathing, headache or visual disturbances   Complete by:  As directed    Call MD for:  extreme fatigue   Complete by:  As directed    Call MD for:  hives   Complete by:  As directed    Call MD for:  persistant dizziness or light-headedness   Complete by:  As directed    Call MD for:  persistant nausea and vomiting   Complete by:  As directed    Call MD for:  redness, tenderness, or signs of infection (pain, swelling, redness, odor or green/yellow discharge around incision site)   Complete by:  As directed    Call MD for:  severe uncontrolled pain   Complete by:  As directed    Call MD for:  temperature >100.4   Complete by:  As directed    Diet - low sodium heart healthy   Complete by:  As directed    Discharge instructions   Complete by:  As directed    Call [phone] for a followup appointment. Take a stool softener while you are using pain medications.   Driving Restrictions   Complete by:  As directed    Do not drive for 2 weeks.   Increase activity slowly   Complete by:  As directed    Lifting restrictions   Complete by:  As directed    Do not lift more than 5 pounds. No excessive bending or twisting.   May shower / Bathe   Complete by:  As directed    Remove the dressing for 3 days after surgery.  You may shower, but leave the incision alone.   Remove dressing in 48 hours   Complete by:  As directed    Your stitches are under the scan and will dissolve by themselves. The Steri-Strips will fall off after you take a few showers. Do not rub back or pick at the wound, Leave the wound alone.     Allergies as of 06/12/2018      Reactions   Gabapentin Anaphylaxis   irritable   Lyrica [pregabalin] Other (See Comments)   Hallucinations.     Sulfonamide Derivatives Other (See Comments)   Unknown; as a child   Ambien [zolpidem] Other (See Comments)   "reverse effect"   Diovan [valsartan] Other (  See Comments)   Lightheaded,dizziness      Medication List    STOP taking these medications   acetaminophen 325 MG tablet Commonly known as:  TYLENOL     TAKE these medications   atorvastatin 10 MG tablet Commonly known as:  LIPITOR Take 10 mg by mouth daily.   Calcium + D3 600-200 MG-UNIT Tabs Take 1 tablet by mouth 2 (two) times daily.   cyclobenzaprine 10 MG tablet Commonly known as:  FLEXERIL Take 1 tablet (10 mg total) by mouth 3 (three) times daily as needed for muscle spasms.   docusate sodium 100 MG capsule Commonly known as:  COLACE Take 1 capsule (100 mg total) by mouth 2 (two) times daily.   folic acid 076 MCG tablet Commonly  known as:  FOLVITE Take 800 mcg by mouth daily.   lamoTRIgine 25 MG tablet Commonly known as:  LAMICTAL TAKE (2) TABLETS TWICE DAILY. What changed:    how much to take  how to take this  when to take this   levETIRAcetam 500 MG tablet Commonly known as:  KEPPRA Take 1 tablet (500 mg total) by mouth 2 (two) times daily.   multivitamin-iron-minerals-folic acid chewable tablet Chew 1 tablet by mouth daily.   oxyCODONE 5 MG immediate release tablet Commonly known as:  Oxy IR/ROXICODONE Take 1 tablet (5 mg total) by mouth every 4 (four) hours as needed for moderate pain ((score 4 to 6)).   pantoprazole 40 MG tablet Commonly known as:  PROTONIX Take 40 mg by mouth daily.   RECLAST IV Inject into the vein. Last 06-15-17 (takes yearly).   tamsulosin 0.4 MG Caps capsule Commonly known as:  FLOMAX Take 1 capsule (0.4 mg total) by mouth daily after supper.   vitamin B-12 1000 MCG tablet Commonly known as:  CYANOCOBALAMIN Take 1,000 mcg by mouth daily.   vitamin C 1000 MG tablet Take 1,000 mg by mouth daily.        Signed: Ophelia Charter 06/12/2018, 7:40 AM

## 2018-06-12 NOTE — Evaluation (Signed)
Occupational Therapy Evaluation and Discharge Patient Details Name: Jeffrey Castillo MRN: [id number] DOB: [birth year] Today's Date: 06/12/2018    History of Present Illness Pt is a 67 y.o. male who presents s/p L3/4 L4/5and L5/S1 nerve root decompression and fusion.  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   Clinical Impression   PTA patient reports independent with ADLs, limited IADLs, and using cane for community mobility.  Patient currently admitted for above and limited by pain, impaired balance and precautions.  Patient educated on back precautions, brace mgmt and wear schedule, ADL compensatory techniques, recommendations, mobility, safety and energy conservation.  Patient requires min assist for LB ADLs (slight discomfort with figure 4 technique but will have spouses assist, use AE, or complete in supine), supervision for toilet transfers and shower transfers, and supervision for grooming at sink.  Patient verbalizes understanding with all education and compensatory techniques, good adherence to precautions throughout session. Pt reports he will have support of spouse at dc as needed.  At this time, all OT needs have been met and no further needs have been identified.  Thank you for this referral.  OT signing off.     Follow Up Recommendations  No OT follow up;Supervision - Intermittent    Equipment Recommendations  None recommended by OT    Recommendations for Other Services       Precautions / Restrictions Precautions Precautions: Back Precaution Booklet Issued: Yes (comment) Precaution Comments: reviewed precautions with patient, able to recall without cueing Required Braces or Orthoses: Spinal Brace Spinal Brace: Lumbar corset Restrictions Weight Bearing Restrictions: No      Mobility Bed Mobility               General bed mobility comments: seated EOB upon entry  Transfers Overall transfer level: Needs assistance Equipment  used: None Transfers: Sit to/from Stand Sit to Stand: Supervision         General transfer comment: supervision for safety, good technique and body mechanics    Balance Overall balance assessment: Needs assistance Sitting-balance support: No upper extremity supported;Feet supported Sitting balance-Leahy Scale: Good     Standing balance support: No upper extremity supported;During functional activity Standing balance-Leahy Scale: Good                             ADL either performed or assessed with clinical judgement   ADL Overall ADL's : Needs assistance/impaired     Grooming: Supervision/safety;Standing Grooming Details (indicate cue type and reason): reviewed compensatory techniques for grooming at sink  Upper Body Bathing: Set up;Sitting   Lower Body Bathing: Minimal assistance;Sitting/lateral leans;Cueing for back precautions;Cueing for compensatory techniques Lower Body Bathing Details (indicate cue type and reason): reviewed use of long sponge for increased ease with LB bathing, due to some discomfort with figure 4 technique Upper Body Dressing : Set up;Sitting   Lower Body Dressing: Minimal assistance;Sit to/from stand;Cueing for back precautions;Cueing for compensatory techniques Lower Body Dressing Details (indicate cue type and reason): reviewed use of slip on shoes, assist with socks as needed and compensatory techniques with pants due to slight discomfort with figure 4 technique; will have support of spouse as needed Toilet Transfer: Supervision/safety;Ambulation;Comfort height toilet;Grab bars   Toileting- Clothing Manipulation and Hygiene: Supervision/safety;Sit to/from stand;Cueing for compensatory techniques   Tub/ Shower Transfer: Walk-in shower;Supervision/safety;Shower seat;Ambulation Tub/Shower Transfer Details (indicate cue type and reason): simulated walk in shower, side stepping over threshold  Functional mobility during  ADLs:  Supervision/safety General ADL Comments: educated on back precautions, compensatory techniques and body mechanics      Vision Baseline Vision/History: Wears glasses Wears Glasses: Reading only Patient Visual Report: No change from baseline Vision Assessment?: No apparent visual deficits     Perception     Praxis      Pertinent Vitals/Pain Pain Assessment: Faces Faces Pain Scale: Hurts little more Pain Location: incision site Pain Descriptors / Indicators: Discomfort;Grimacing;Operative site guarding;Pressure Pain Intervention(s): Monitored during session     Hand Dominance Right   Extremity/Trunk Assessment Upper Extremity Assessment Upper Extremity Assessment: Overall WFL for tasks assessed   Lower Extremity Assessment Lower Extremity Assessment: Defer to PT evaluation   Cervical / Trunk Assessment Cervical / Trunk Assessment: Other exceptions Cervical / Trunk Exceptions: s/p lumbar sx    Communication Communication Communication: No difficulties   Cognition Arousal/Alertness: Awake/alert Behavior During Therapy: WFL for tasks assessed/performed Overall Cognitive Status: Within Functional Limits for tasks assessed                                 General Comments: A&Ox3   General Comments  Pt educated on car transfers and walking program    Exercises     Shoulder Instructions      Home Living Family/patient expects to be discharged to:: Private residence Living Arrangements: Spouse/significant other Available Help at Discharge: Family Type of Home: House Home Access: Stairs to enter Technical brewer of Steps: 2 Entrance Stairs-Rails: Left;Right Home Layout: One level     Bathroom Shower/Tub: Occupational psychologist: Handicapped height Bathroom Accessibility: Yes   Home Equipment: Environmental consultant - standard;Cane - single point;Shower seat - built in;Grab bars - toilet;Grab bars - tub/shower          Prior  Functioning/Environment Level of Independence: Needs assistance  Gait / Transfers Assistance Needed: reports independent with mobility, using cane in community ADL's / Homemaking Assistance Needed: reports independent with ADLs, some assist with IADLs due to pain    Comments: Used cane and walker after previous surgeries         OT Problem List: Decreased activity tolerance;Pain;Decreased knowledge of precautions;Decreased knowledge of use of DME or AE;Decreased safety awareness;Impaired balance (sitting and/or standing)      OT Treatment/Interventions:      OT Goals(Current goals can be found in the care plan section) Acute Rehab OT Goals Patient Stated Goal: return home OT Goal Formulation: With patient  OT Frequency:     Barriers to D/C:            Co-evaluation              AM-PAC OT "6 Clicks" Daily Activity     Outcome Measure Help from another person eating meals?: None Help from another person taking care of personal grooming?: None Help from another person toileting, which includes using toliet, bedpan, or urinal?: None Help from another person bathing (including washing, rinsing, drying)?: A Little Help from another person to put on and taking off regular upper body clothing?: None Help from another person to put on and taking off regular lower body clothing?: A Little 6 Click Score: 22   End of Session Equipment Utilized During Treatment: Back brace Nurse Communication: Mobility status  Activity Tolerance: Patient tolerated treatment well Patient left: with call bell/phone within reach;Other (comment)(seated EOB)  OT Visit Diagnosis: Pain Pain - part of body: (incision )  Time: 7493-5521 OT Time Calculation (min): 14 min Charges:  OT General Charges $OT Visit: 1 Visit OT Evaluation $OT Eval Low Complexity: [place], OT Acute Rehabilitation Services Pager [phone] Office [phone]  Delight Stare 06/12/2018,  8:29 AM

## 2018-06-12 NOTE — Progress Notes (Signed)
Patient is discharged from room 3C04 at this time. Alert and in stable condition. IV site d/c'd and instructions read to patient and spouse with understanding verbalized. Left unit via wheelchair with all belongings at side. 

## 2018-06-12 NOTE — Evaluation (Signed)
Physical Therapy Evaluation and Discharge Patient Details Name: Jeffrey Castillo MRN: [id number] DOB: [birth year] Today's Date: 06/12/2018   History of Present Illness  Pt is a 67 y.o. male who presents s/p L3/4 L4/5and L5/S1 nerve root decompression and fusion.  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  Clinical Impression  Patient evaluated by Physical Therapy with no further acute PT needs identified. Pt amb 250 ft with RW with supervision, 50 ft with no AD with min guard progressing to supervision. PTA pt independent without AD, performed short bouts of activity secondary to numbness in feet and legs. Pt educated on maintaining back precautions with bed mobility, transfers, amb, car transfers and the importance of regular amb and a walking program. All education has been completed and the patient has no further questions. See below for any follow-up Physical Therapy or equipment needs. PT is signing off. Thank you for this referral.     Follow Up Recommendations No PT follow up    Equipment Recommendations  None recommended by PT    Recommendations for Other Services       Precautions / Restrictions Precautions Precautions: Back Precaution Booklet Issued: Yes (comment) Precaution Comments: reviewed precautions with patient, able to recall without cueing Required Braces or Orthoses: Spinal Brace Spinal Brace: Lumbar corset Restrictions Weight Bearing Restrictions: No      Mobility  Bed Mobility               General bed mobility comments: seated EOB upon entry  Transfers Overall transfer level: Needs assistance Equipment used: None Transfers: Sit to/from Stand Sit to Stand: Supervision         General transfer comment: supervision for safety, good technique and body mechanics  Ambulation/Gait Ambulation/Gait assistance: Min guard;Supervision Gait Distance (Feet): 300 Feet Assistive device: Rolling walker (2 wheeled) Gait  Pattern/deviations: Step-through pattern;Decreased stride length Gait velocity: decreased Gait velocity interpretation: >2.62 ft/sec, indicative of community ambulatory General Gait Details: VC to stay close to RW and stand upright, supervision with RW (250 ft). Min guard with no AD for assessment purposes, progressed to supervision (50 ft). Stated the numbness in legs was getting worse, self limited distance. Without AD, increased weight shift, ipsilateral trunk lean and stance time on each leg, no LOB. Pt stated he was only planning on amb without AD at home.  Stairs Stairs: Yes Stairs assistance: Min guard Stair Management: One rail Right Number of Stairs: 5 General stair comments: min guard for assessment of safety, VC to turn towards rails for safety  Wheelchair Mobility    Modified Rankin (Stroke Patients Only)       Balance Overall balance assessment: Needs assistance Sitting-balance support: No upper extremity supported;Feet supported Sitting balance-Leahy Scale: Good     Standing balance support: No upper extremity supported;During functional activity Standing balance-Leahy Scale: Good                               Pertinent Vitals/Pain Pain Assessment: Faces Faces Pain Scale: Hurts little more Pain Location: incision site Pain Descriptors / Indicators: Discomfort;Grimacing;Operative site guarding;Pressure Pain Intervention(s): Monitored during session    Home Living Family/patient expects to be discharged to:: Private residence Living Arrangements: Spouse/significant other Available Help at Discharge: Family Type of Home: House Home Access: Stairs to enter Entrance Stairs-Rails: Chemical engineer of Steps: 2 Home Layout: One level Home Equipment: Walker - standard;Cane - single point;Shower seat - built in;Grab  bars - toilet;Grab bars - tub/shower      Prior Function Level of Independence: Needs assistance   Gait / Transfers  Assistance Needed: reports independent with mobility, using cane in community  ADL's / Homemaking Assistance Needed: reports independent with ADLs, some assist with IADLs due to pain   Comments: Used cane and walker after previous surgeries      Hand Dominance   Dominant Hand: Right    Extremity/Trunk Assessment   Upper Extremity Assessment Upper Extremity Assessment: Overall WFL for tasks assessed    Lower Extremity Assessment Lower Extremity Assessment: Defer to PT evaluation    Cervical / Trunk Assessment Cervical / Trunk Assessment: Other exceptions Cervical / Trunk Exceptions: s/p lumbar sx   Communication   Communication: No difficulties  Cognition Arousal/Alertness: Awake/alert Behavior During Therapy: WFL for tasks assessed/performed Overall Cognitive Status: Within Functional Limits for tasks assessed                                 General Comments: A&Ox3      General Comments General comments (skin integrity, edema, etc.): Pt educated on car transfers and walking program    Exercises     Assessment/Plan    PT Assessment Patent does not need any further PT services  PT Problem List         PT Treatment Interventions      PT Goals (Current goals can be found in the Care Plan section)  Acute Rehab PT Goals Patient Stated Goal: return home PT Goal Formulation: With patient Time For Goal Achievement: 06/26/18 Potential to Achieve Goals: Good    Frequency     Barriers to discharge        Co-evaluation               AM-PAC PT "6 Clicks" Mobility  Outcome Measure Help needed turning from your back to your side while in a flat bed without using bedrails?: None Help needed moving from lying on your back to sitting on the side of a flat bed without using bedrails?: A Little Help needed moving to and from a bed to a chair (including a wheelchair)?: None Help needed standing up from a chair using your arms (e.g., wheelchair or  bedside chair)?: None Help needed to walk in hospital room?: A Little Help needed climbing 3-5 steps with a railing? : A Little 6 Click Score: 21    End of Session Equipment Utilized During Treatment: Gait belt Activity Tolerance: Patient tolerated treatment well Patient left: in chair;Other (comment);with call bell/phone within reach(handoff to OT) Nurse Communication: Mobility status      Time: 8921-1941 PT Time Calculation (min) (ACUTE ONLY): 17 min   Charges:   PT Evaluation $PT Eval Low Complexity: 1 Low     Gilda Crease, SPT Acute Rehab Services [place], SPT Acute Rehab Services [place] 06/12/2018, 9:19 AM

## 2018-06-29 DIAGNOSIS — L0591 Pilonidal cyst without abscess: Secondary | ICD-10-CM | POA: Diagnosis not present

## 2018-06-29 DIAGNOSIS — L821 Other seborrheic keratosis: Secondary | ICD-10-CM | POA: Diagnosis not present

## 2018-06-29 DIAGNOSIS — D225 Melanocytic nevi of trunk: Secondary | ICD-10-CM | POA: Diagnosis not present

## 2018-06-29 DIAGNOSIS — Z85828 Personal history of other malignant neoplasm of skin: Secondary | ICD-10-CM | POA: Diagnosis not present

## 2018-06-29 DIAGNOSIS — L814 Other melanin hyperpigmentation: Secondary | ICD-10-CM | POA: Diagnosis not present

## 2018-06-29 DIAGNOSIS — D1801 Hemangioma of skin and subcutaneous tissue: Secondary | ICD-10-CM | POA: Diagnosis not present

## 2018-06-29 DIAGNOSIS — L570 Actinic keratosis: Secondary | ICD-10-CM | POA: Diagnosis not present

## 2018-07-13 DIAGNOSIS — M4317 Spondylolisthesis, lumbosacral region: Secondary | ICD-10-CM | POA: Diagnosis not present

## 2018-07-23 DIAGNOSIS — R262 Difficulty in walking, not elsewhere classified: Secondary | ICD-10-CM | POA: Diagnosis not present

## 2018-07-23 DIAGNOSIS — M545 Low back pain: Secondary | ICD-10-CM | POA: Diagnosis not present

## 2018-07-23 DIAGNOSIS — M6281 Muscle weakness (generalized): Secondary | ICD-10-CM | POA: Diagnosis not present

## 2018-07-25 DIAGNOSIS — M6281 Muscle weakness (generalized): Secondary | ICD-10-CM | POA: Diagnosis not present

## 2018-07-25 DIAGNOSIS — R262 Difficulty in walking, not elsewhere classified: Secondary | ICD-10-CM | POA: Diagnosis not present

## 2018-07-25 DIAGNOSIS — M545 Low back pain: Secondary | ICD-10-CM | POA: Diagnosis not present

## 2018-07-30 DIAGNOSIS — F17210 Nicotine dependence, cigarettes, uncomplicated: Secondary | ICD-10-CM | POA: Diagnosis not present

## 2018-07-30 DIAGNOSIS — R569 Unspecified convulsions: Secondary | ICD-10-CM | POA: Diagnosis not present

## 2018-07-30 DIAGNOSIS — K265 Chronic or unspecified duodenal ulcer with perforation: Secondary | ICD-10-CM | POA: Diagnosis not present

## 2018-07-30 DIAGNOSIS — B351 Tinea unguium: Secondary | ICD-10-CM | POA: Diagnosis not present

## 2018-07-30 DIAGNOSIS — G629 Polyneuropathy, unspecified: Secondary | ICD-10-CM | POA: Diagnosis not present

## 2018-07-30 DIAGNOSIS — Z6823 Body mass index (BMI) 23.0-23.9, adult: Secondary | ICD-10-CM | POA: Diagnosis not present

## 2018-07-30 DIAGNOSIS — I700 Atherosclerosis of aorta: Secondary | ICD-10-CM | POA: Diagnosis not present

## 2018-07-30 DIAGNOSIS — I10 Essential (primary) hypertension: Secondary | ICD-10-CM | POA: Diagnosis not present

## 2018-07-30 DIAGNOSIS — M810 Age-related osteoporosis without current pathological fracture: Secondary | ICD-10-CM | POA: Diagnosis not present

## 2018-08-01 DIAGNOSIS — M545 Low back pain: Secondary | ICD-10-CM | POA: Diagnosis not present

## 2018-08-01 DIAGNOSIS — M6281 Muscle weakness (generalized): Secondary | ICD-10-CM | POA: Diagnosis not present

## 2018-08-01 DIAGNOSIS — R262 Difficulty in walking, not elsewhere classified: Secondary | ICD-10-CM | POA: Diagnosis not present

## 2018-08-03 DIAGNOSIS — R262 Difficulty in walking, not elsewhere classified: Secondary | ICD-10-CM | POA: Diagnosis not present

## 2018-08-03 DIAGNOSIS — M6281 Muscle weakness (generalized): Secondary | ICD-10-CM | POA: Diagnosis not present

## 2018-08-03 DIAGNOSIS — M545 Low back pain: Secondary | ICD-10-CM | POA: Diagnosis not present

## 2018-08-08 DIAGNOSIS — R262 Difficulty in walking, not elsewhere classified: Secondary | ICD-10-CM | POA: Diagnosis not present

## 2018-08-08 DIAGNOSIS — M545 Low back pain: Secondary | ICD-10-CM | POA: Diagnosis not present

## 2018-08-08 DIAGNOSIS — M6281 Muscle weakness (generalized): Secondary | ICD-10-CM | POA: Diagnosis not present

## 2018-08-10 DIAGNOSIS — L821 Other seborrheic keratosis: Secondary | ICD-10-CM | POA: Diagnosis not present

## 2018-08-10 DIAGNOSIS — L570 Actinic keratosis: Secondary | ICD-10-CM | POA: Diagnosis not present

## 2018-08-10 DIAGNOSIS — M6281 Muscle weakness (generalized): Secondary | ICD-10-CM | POA: Diagnosis not present

## 2018-08-10 DIAGNOSIS — M545 Low back pain: Secondary | ICD-10-CM | POA: Diagnosis not present

## 2018-08-15 DIAGNOSIS — M545 Low back pain: Secondary | ICD-10-CM | POA: Diagnosis not present

## 2018-08-15 DIAGNOSIS — R262 Difficulty in walking, not elsewhere classified: Secondary | ICD-10-CM | POA: Diagnosis not present

## 2018-08-20 ENCOUNTER — Telehealth (HOSPITAL_COMMUNITY): Payer: Self-pay | Admitting: General Practice

## 2018-08-20 DIAGNOSIS — R262 Difficulty in walking, not elsewhere classified: Secondary | ICD-10-CM | POA: Diagnosis not present

## 2018-08-20 DIAGNOSIS — M6281 Muscle weakness (generalized): Secondary | ICD-10-CM | POA: Diagnosis not present

## 2018-08-22 DIAGNOSIS — R262 Difficulty in walking, not elsewhere classified: Secondary | ICD-10-CM | POA: Diagnosis not present

## 2018-08-22 DIAGNOSIS — M6281 Muscle weakness (generalized): Secondary | ICD-10-CM | POA: Diagnosis not present

## 2018-08-22 DIAGNOSIS — M545 Low back pain: Secondary | ICD-10-CM | POA: Diagnosis not present

## 2018-08-23 ENCOUNTER — Encounter (HOSPITAL_COMMUNITY): Payer: Self-pay

## 2018-08-23 ENCOUNTER — Ambulatory Visit (HOSPITAL_COMMUNITY)
Admission: RE | Admit: 2018-08-23 | Discharge: 2018-08-23 | Disposition: A | Discharge status: Home / Self Care | Payer: Medicare Other | Source: Ambulatory Visit | Attending: Family Medicine | Admitting: Family Medicine

## 2018-08-23 DIAGNOSIS — M810 Age-related osteoporosis without current pathological fracture: Secondary | ICD-10-CM | POA: Insufficient documentation

## 2018-08-23 MED ORDER — SODIUM CHLORIDE 0.9 % IV SOLN
Freq: Once | INTRAVENOUS | Status: AC
  Administered 2018-08-23: 13:00:00 via INTRAVENOUS

## 2018-08-23 MED ORDER — ZOLEDRONIC ACID 5 MG/100ML IV SOLN
5.0000 mg | Freq: Once | INTRAVENOUS | Status: AC
  Administered 2018-08-23: 5 mg via INTRAVENOUS
  Filled 2018-08-23: qty 100

## 2018-08-23 NOTE — Discharge Instructions (Signed)

## 2018-08-24 DIAGNOSIS — M545 Low back pain: Secondary | ICD-10-CM | POA: Diagnosis not present

## 2018-08-24 DIAGNOSIS — M6281 Muscle weakness (generalized): Secondary | ICD-10-CM | POA: Diagnosis not present

## 2018-08-24 DIAGNOSIS — R262 Difficulty in walking, not elsewhere classified: Secondary | ICD-10-CM | POA: Diagnosis not present

## 2018-08-27 DIAGNOSIS — K7030 Alcoholic cirrhosis of liver without ascites: Secondary | ICD-10-CM | POA: Diagnosis not present

## 2018-08-27 DIAGNOSIS — D132 Benign neoplasm of duodenum: Secondary | ICD-10-CM | POA: Diagnosis not present

## 2018-08-27 DIAGNOSIS — K317 Polyp of stomach and duodenum: Secondary | ICD-10-CM | POA: Diagnosis not present

## 2018-08-27 DIAGNOSIS — D139 Benign neoplasm of ill-defined sites within the digestive system: Secondary | ICD-10-CM | POA: Diagnosis not present

## 2018-08-29 DIAGNOSIS — M545 Low back pain: Secondary | ICD-10-CM | POA: Diagnosis not present

## 2018-08-29 DIAGNOSIS — M6281 Muscle weakness (generalized): Secondary | ICD-10-CM | POA: Diagnosis not present

## 2018-08-29 DIAGNOSIS — R262 Difficulty in walking, not elsewhere classified: Secondary | ICD-10-CM | POA: Diagnosis not present

## 2018-09-03 DIAGNOSIS — M545 Low back pain: Secondary | ICD-10-CM | POA: Diagnosis not present

## 2018-09-03 DIAGNOSIS — R262 Difficulty in walking, not elsewhere classified: Secondary | ICD-10-CM | POA: Diagnosis not present

## 2018-09-03 DIAGNOSIS — R279 Unspecified lack of coordination: Secondary | ICD-10-CM | POA: Diagnosis not present

## 2018-09-03 DIAGNOSIS — M6281 Muscle weakness (generalized): Secondary | ICD-10-CM | POA: Diagnosis not present

## 2018-09-05 DIAGNOSIS — M6281 Muscle weakness (generalized): Secondary | ICD-10-CM | POA: Diagnosis not present

## 2018-09-05 DIAGNOSIS — M545 Low back pain: Secondary | ICD-10-CM | POA: Diagnosis not present

## 2018-09-05 DIAGNOSIS — R262 Difficulty in walking, not elsewhere classified: Secondary | ICD-10-CM | POA: Diagnosis not present

## 2018-09-07 DIAGNOSIS — M545 Low back pain: Secondary | ICD-10-CM | POA: Diagnosis not present

## 2018-09-07 DIAGNOSIS — R262 Difficulty in walking, not elsewhere classified: Secondary | ICD-10-CM | POA: Diagnosis not present

## 2018-09-07 DIAGNOSIS — M6281 Muscle weakness (generalized): Secondary | ICD-10-CM | POA: Diagnosis not present

## 2018-09-11 DIAGNOSIS — R945 Abnormal results of liver function studies: Secondary | ICD-10-CM | POA: Diagnosis not present

## 2018-09-12 DIAGNOSIS — M545 Low back pain: Secondary | ICD-10-CM | POA: Diagnosis not present

## 2018-09-12 DIAGNOSIS — M6281 Muscle weakness (generalized): Secondary | ICD-10-CM | POA: Diagnosis not present

## 2018-09-12 DIAGNOSIS — R262 Difficulty in walking, not elsewhere classified: Secondary | ICD-10-CM | POA: Diagnosis not present

## 2018-09-14 DIAGNOSIS — M6281 Muscle weakness (generalized): Secondary | ICD-10-CM | POA: Diagnosis not present

## 2018-09-14 DIAGNOSIS — R262 Difficulty in walking, not elsewhere classified: Secondary | ICD-10-CM | POA: Diagnosis not present

## 2018-09-14 DIAGNOSIS — M545 Low back pain: Secondary | ICD-10-CM | POA: Diagnosis not present

## 2018-09-18 DIAGNOSIS — R262 Difficulty in walking, not elsewhere classified: Secondary | ICD-10-CM | POA: Diagnosis not present

## 2018-09-18 DIAGNOSIS — M6281 Muscle weakness (generalized): Secondary | ICD-10-CM | POA: Diagnosis not present

## 2018-09-18 DIAGNOSIS — M545 Low back pain: Secondary | ICD-10-CM | POA: Diagnosis not present

## 2018-09-20 ENCOUNTER — Other Ambulatory Visit: Payer: Self-pay | Admitting: *Deleted

## 2018-09-20 ENCOUNTER — Ambulatory Visit: Payer: Medicare Other | Admitting: Nurse Practitioner

## 2018-09-20 MED ORDER — LAMOTRIGINE 25 MG PO TABS: ORAL_TABLET | ORAL | 3 refills | Status: DC

## 2018-09-20 MED ORDER — LEVETIRACETAM 500 MG PO TABS: 500.0000 mg | ORAL_TABLET | Freq: Two times a day (BID) | ORAL | 3 refills | Status: DC

## 2018-09-20 NOTE — Telephone Encounter (Signed)
Patient needs refills of Keppra, Lamictal. Both refilled as per previous refill to [facility].

## 2018-09-20 NOTE — Telephone Encounter (Signed)
Patient came in this morning.  He thought he had an appointment but it had been rescheduled to August.  States he needs his Keppra and he thinks the Lamictal refilled.  Please send to [facility].  Call if questions.

## 2018-09-21 DIAGNOSIS — M545 Low back pain: Secondary | ICD-10-CM | POA: Diagnosis not present

## 2018-09-21 DIAGNOSIS — M6281 Muscle weakness (generalized): Secondary | ICD-10-CM | POA: Diagnosis not present

## 2018-09-21 DIAGNOSIS — R262 Difficulty in walking, not elsewhere classified: Secondary | ICD-10-CM | POA: Diagnosis not present

## 2018-10-09 DIAGNOSIS — M545 Low back pain: Secondary | ICD-10-CM | POA: Diagnosis not present

## 2018-10-09 DIAGNOSIS — M6281 Muscle weakness (generalized): Secondary | ICD-10-CM | POA: Diagnosis not present

## 2018-10-16 DIAGNOSIS — M6281 Muscle weakness (generalized): Secondary | ICD-10-CM | POA: Diagnosis not present

## 2018-10-16 DIAGNOSIS — R262 Difficulty in walking, not elsewhere classified: Secondary | ICD-10-CM | POA: Diagnosis not present

## 2018-10-22 DIAGNOSIS — M4317 Spondylolisthesis, lumbosacral region: Secondary | ICD-10-CM | POA: Diagnosis not present

## 2018-10-23 DIAGNOSIS — M4317 Spondylolisthesis, lumbosacral region: Secondary | ICD-10-CM | POA: Diagnosis not present

## 2018-10-25 DIAGNOSIS — M545 Low back pain: Secondary | ICD-10-CM | POA: Diagnosis not present

## 2018-10-25 DIAGNOSIS — M6281 Muscle weakness (generalized): Secondary | ICD-10-CM | POA: Diagnosis not present

## 2018-10-25 DIAGNOSIS — R262 Difficulty in walking, not elsewhere classified: Secondary | ICD-10-CM | POA: Diagnosis not present

## 2018-11-23 DIAGNOSIS — G629 Polyneuropathy, unspecified: Secondary | ICD-10-CM | POA: Diagnosis not present

## 2018-11-23 DIAGNOSIS — I10 Essential (primary) hypertension: Secondary | ICD-10-CM | POA: Diagnosis not present

## 2019-01-18 DIAGNOSIS — Z6823 Body mass index (BMI) 23.0-23.9, adult: Secondary | ICD-10-CM | POA: Diagnosis not present

## 2019-01-18 DIAGNOSIS — M79672 Pain in left foot: Secondary | ICD-10-CM | POA: Diagnosis not present

## 2019-01-18 DIAGNOSIS — M810 Age-related osteoporosis without current pathological fracture: Secondary | ICD-10-CM | POA: Diagnosis not present

## 2019-01-18 DIAGNOSIS — I10 Essential (primary) hypertension: Secondary | ICD-10-CM | POA: Diagnosis not present

## 2019-01-18 DIAGNOSIS — F17210 Nicotine dependence, cigarettes, uncomplicated: Secondary | ICD-10-CM | POA: Diagnosis not present

## 2019-01-23 DIAGNOSIS — M2022 Hallux rigidus, left foot: Secondary | ICD-10-CM | POA: Diagnosis not present

## 2019-01-30 DIAGNOSIS — L578 Other skin changes due to chronic exposure to nonionizing radiation: Secondary | ICD-10-CM | POA: Diagnosis not present

## 2019-01-30 DIAGNOSIS — L814 Other melanin hyperpigmentation: Secondary | ICD-10-CM | POA: Diagnosis not present

## 2019-01-30 DIAGNOSIS — L821 Other seborrheic keratosis: Secondary | ICD-10-CM | POA: Diagnosis not present

## 2019-01-30 DIAGNOSIS — L570 Actinic keratosis: Secondary | ICD-10-CM | POA: Diagnosis not present

## 2019-01-30 DIAGNOSIS — D2260 Melanocytic nevi of unspecified upper limb, including shoulder: Secondary | ICD-10-CM | POA: Diagnosis not present

## 2019-02-18 ENCOUNTER — Encounter: Payer: Self-pay | Admitting: Neurology

## 2019-02-18 ENCOUNTER — Other Ambulatory Visit: Payer: Self-pay

## 2019-02-18 ENCOUNTER — Ambulatory Visit (INDEPENDENT_AMBULATORY_CARE_PROVIDER_SITE_OTHER): Payer: Medicare Other | Admitting: Neurology

## 2019-02-18 VITALS — BP 129/69 | HR 61 | Temp 98.6°F | Ht 68.0 in | Wt 147.0 lb

## 2019-02-18 DIAGNOSIS — G40309 Generalized idiopathic epilepsy and epileptic syndromes, not intractable, without status epilepticus: Secondary | ICD-10-CM
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
## 2019-02-20 DIAGNOSIS — M2022 Hallux rigidus, left foot: Secondary | ICD-10-CM | POA: Diagnosis not present

## 2019-02-25 DIAGNOSIS — Z87898 Personal history of other specified conditions: Secondary | ICD-10-CM | POA: Diagnosis not present

## 2019-02-25 DIAGNOSIS — G629 Polyneuropathy, unspecified: Secondary | ICD-10-CM | POA: Diagnosis not present

## 2019-02-25 DIAGNOSIS — Z0000 Encounter for general adult medical examination without abnormal findings: Secondary | ICD-10-CM | POA: Diagnosis not present

## 2019-02-25 DIAGNOSIS — I10 Essential (primary) hypertension: Secondary | ICD-10-CM | POA: Diagnosis not present

## 2019-02-25 DIAGNOSIS — R569 Unspecified convulsions: Secondary | ICD-10-CM | POA: Diagnosis not present

## 2019-02-25 DIAGNOSIS — M810 Age-related osteoporosis without current pathological fracture: Secondary | ICD-10-CM | POA: Diagnosis not present

## 2019-02-25 DIAGNOSIS — I2510 Atherosclerotic heart disease of native coronary artery without angina pectoris: Secondary | ICD-10-CM | POA: Diagnosis not present

## 2019-02-25 DIAGNOSIS — K265 Chronic or unspecified duodenal ulcer with perforation: Secondary | ICD-10-CM | POA: Diagnosis not present

## 2019-02-25 DIAGNOSIS — F17210 Nicotine dependence, cigarettes, uncomplicated: Secondary | ICD-10-CM | POA: Diagnosis not present

## 2019-02-26 DIAGNOSIS — Z23 Encounter for immunization: Secondary | ICD-10-CM | POA: Diagnosis not present

## 2019-02-28 ENCOUNTER — Other Ambulatory Visit: Payer: Self-pay | Admitting: Family Medicine

## 2019-02-28 DIAGNOSIS — M810 Age-related osteoporosis without current pathological fracture: Secondary | ICD-10-CM

## 2019-03-05 ENCOUNTER — Other Ambulatory Visit: Payer: Self-pay

## 2019-03-05 ENCOUNTER — Ambulatory Visit
Admission: RE | Admit: 2019-03-05 | Discharge: 2019-03-05 | Disposition: A | Discharge status: Home / Self Care | Payer: Medicare Other | Source: Ambulatory Visit | Attending: Family Medicine | Admitting: Family Medicine

## 2019-03-05 DIAGNOSIS — M85852 Other specified disorders of bone density and structure, left thigh: Secondary | ICD-10-CM | POA: Diagnosis not present

## 2019-03-05 DIAGNOSIS — M810 Age-related osteoporosis without current pathological fracture: Secondary | ICD-10-CM

## 2019-04-03 DIAGNOSIS — M2022 Hallux rigidus, left foot: Secondary | ICD-10-CM | POA: Diagnosis not present

## 2019-04-03 DIAGNOSIS — M19071 Primary osteoarthritis, right ankle and foot: Secondary | ICD-10-CM | POA: Diagnosis not present

## 2019-04-04 DIAGNOSIS — M79672 Pain in left foot: Secondary | ICD-10-CM | POA: Diagnosis not present

## 2019-04-04 DIAGNOSIS — M19072 Primary osteoarthritis, left ankle and foot: Secondary | ICD-10-CM | POA: Diagnosis not present

## 2019-04-17 DIAGNOSIS — M19072 Primary osteoarthritis, left ankle and foot: Secondary | ICD-10-CM | POA: Diagnosis not present

## 2019-04-22 DIAGNOSIS — J439 Emphysema, unspecified: Secondary | ICD-10-CM | POA: Diagnosis not present

## 2019-04-22 DIAGNOSIS — M810 Age-related osteoporosis without current pathological fracture: Secondary | ICD-10-CM | POA: Diagnosis not present

## 2019-04-22 DIAGNOSIS — M47816 Spondylosis without myelopathy or radiculopathy, lumbar region: Secondary | ICD-10-CM | POA: Diagnosis not present

## 2019-04-22 DIAGNOSIS — I10 Essential (primary) hypertension: Secondary | ICD-10-CM | POA: Diagnosis not present

## 2019-04-22 DIAGNOSIS — I2510 Atherosclerotic heart disease of native coronary artery without angina pectoris: Secondary | ICD-10-CM | POA: Diagnosis not present

## 2019-04-26 DIAGNOSIS — M4180 Other forms of scoliosis, site unspecified: Secondary | ICD-10-CM | POA: Diagnosis not present

## 2019-04-26 DIAGNOSIS — M4317 Spondylolisthesis, lumbosacral region: Secondary | ICD-10-CM | POA: Diagnosis not present

## 2019-04-26 DIAGNOSIS — R030 Elevated blood-pressure reading, without diagnosis of hypertension: Secondary | ICD-10-CM | POA: Diagnosis not present

## 2019-04-29 ENCOUNTER — Other Ambulatory Visit: Payer: Self-pay | Admitting: *Deleted

## 2019-04-29 DIAGNOSIS — Z87891 Personal history of nicotine dependence: Secondary | ICD-10-CM

## 2019-04-29 DIAGNOSIS — Z122 Encounter for screening for malignant neoplasm of respiratory organs: Secondary | ICD-10-CM

## 2019-04-29 DIAGNOSIS — F17210 Nicotine dependence, cigarettes, uncomplicated: Secondary | ICD-10-CM

## 2019-05-10 ENCOUNTER — Ambulatory Visit
Admission: RE | Admit: 2019-05-10 | Discharge: 2019-05-10 | Disposition: A | Discharge status: Home / Self Care | Payer: Medicare Other | Source: Ambulatory Visit | Attending: Acute Care | Admitting: Acute Care

## 2019-05-10 ENCOUNTER — Other Ambulatory Visit: Payer: Self-pay

## 2019-05-10 DIAGNOSIS — Z122 Encounter for screening for malignant neoplasm of respiratory organs: Secondary | ICD-10-CM

## 2019-05-10 DIAGNOSIS — Z136 Encounter for screening for cardiovascular disorders: Secondary | ICD-10-CM | POA: Diagnosis not present

## 2019-05-10 DIAGNOSIS — F17210 Nicotine dependence, cigarettes, uncomplicated: Secondary | ICD-10-CM | POA: Diagnosis not present

## 2019-05-10 DIAGNOSIS — Z87891 Personal history of nicotine dependence: Secondary | ICD-10-CM

## 2019-05-22 DIAGNOSIS — N201 Calculus of ureter: Secondary | ICD-10-CM | POA: Diagnosis not present

## 2019-05-22 DIAGNOSIS — K8020 Calculus of gallbladder without cholecystitis without obstruction: Secondary | ICD-10-CM | POA: Diagnosis not present

## 2019-05-22 DIAGNOSIS — N200 Calculus of kidney: Secondary | ICD-10-CM | POA: Diagnosis not present

## 2019-05-23 ENCOUNTER — Other Ambulatory Visit: Payer: Self-pay | Admitting: *Deleted

## 2019-05-23 DIAGNOSIS — R935 Abnormal findings on diagnostic imaging of other abdominal regions, including retroperitoneum: Secondary | ICD-10-CM | POA: Diagnosis not present

## 2019-05-23 DIAGNOSIS — Z122 Encounter for screening for malignant neoplasm of respiratory organs: Secondary | ICD-10-CM

## 2019-05-23 DIAGNOSIS — F17210 Nicotine dependence, cigarettes, uncomplicated: Secondary | ICD-10-CM

## 2019-05-23 DIAGNOSIS — Z9689 Presence of other specified functional implants: Secondary | ICD-10-CM | POA: Diagnosis not present

## 2019-05-23 DIAGNOSIS — Z87891 Personal history of nicotine dependence: Secondary | ICD-10-CM

## 2019-05-23 DIAGNOSIS — K5792 Diverticulitis of intestine, part unspecified, without perforation or abscess without bleeding: Secondary | ICD-10-CM | POA: Diagnosis not present

## 2019-05-28 DIAGNOSIS — Z86010 Personal history of colonic polyps: Secondary | ICD-10-CM | POA: Diagnosis not present

## 2019-05-28 DIAGNOSIS — K861 Other chronic pancreatitis: Secondary | ICD-10-CM | POA: Diagnosis not present

## 2019-05-28 DIAGNOSIS — R932 Abnormal findings on diagnostic imaging of liver and biliary tract: Secondary | ICD-10-CM | POA: Diagnosis not present

## 2019-05-28 DIAGNOSIS — K5732 Diverticulitis of large intestine without perforation or abscess without bleeding: Secondary | ICD-10-CM | POA: Diagnosis not present

## 2019-05-29 DIAGNOSIS — K5732 Diverticulitis of large intestine without perforation or abscess without bleeding: Secondary | ICD-10-CM | POA: Diagnosis not present

## 2019-05-29 DIAGNOSIS — K861 Other chronic pancreatitis: Secondary | ICD-10-CM | POA: Diagnosis not present

## 2019-05-29 DIAGNOSIS — R932 Abnormal findings on diagnostic imaging of liver and biliary tract: Secondary | ICD-10-CM | POA: Diagnosis not present

## 2019-05-29 DIAGNOSIS — Z86010 Personal history of colonic polyps: Secondary | ICD-10-CM | POA: Diagnosis not present

## 2019-06-24 DIAGNOSIS — R208 Other disturbances of skin sensation: Secondary | ICD-10-CM | POA: Diagnosis not present

## 2019-06-24 DIAGNOSIS — L570 Actinic keratosis: Secondary | ICD-10-CM | POA: Diagnosis not present

## 2019-06-24 DIAGNOSIS — L820 Inflamed seborrheic keratosis: Secondary | ICD-10-CM | POA: Diagnosis not present

## 2019-06-24 DIAGNOSIS — D485 Neoplasm of uncertain behavior of skin: Secondary | ICD-10-CM | POA: Diagnosis not present

## 2019-06-24 DIAGNOSIS — L821 Other seborrheic keratosis: Secondary | ICD-10-CM | POA: Diagnosis not present

## 2019-06-24 DIAGNOSIS — L738 Other specified follicular disorders: Secondary | ICD-10-CM | POA: Diagnosis not present

## 2019-07-24 DIAGNOSIS — M47816 Spondylosis without myelopathy or radiculopathy, lumbar region: Secondary | ICD-10-CM | POA: Diagnosis not present

## 2019-07-24 DIAGNOSIS — J439 Emphysema, unspecified: Secondary | ICD-10-CM | POA: Diagnosis not present

## 2019-07-24 DIAGNOSIS — I2510 Atherosclerotic heart disease of native coronary artery without angina pectoris: Secondary | ICD-10-CM | POA: Diagnosis not present

## 2019-07-24 DIAGNOSIS — M810 Age-related osteoporosis without current pathological fracture: Secondary | ICD-10-CM | POA: Diagnosis not present

## 2019-07-24 DIAGNOSIS — I10 Essential (primary) hypertension: Secondary | ICD-10-CM | POA: Diagnosis not present

## 2019-07-26 DIAGNOSIS — M545 Low back pain: Secondary | ICD-10-CM | POA: Diagnosis not present

## 2019-07-31 ENCOUNTER — Ambulatory Visit: Payer: Medicare Other | Attending: Internal Medicine

## 2019-07-31 DIAGNOSIS — Z23 Encounter for immunization: Secondary | ICD-10-CM | POA: Insufficient documentation

## 2019-07-31 NOTE — Progress Notes (Signed)
   Covid-19 Vaccination Clinic  Name:  Jeffrey Castillo    MRN: [id number] DOB: 23-Nov-1950  07/31/2019  [name] was observed post Covid-19 immunization for 15 minutes without incidence. He was provided with Vaccine Information Sheet and instruction to access the V-Safe system.   [name] was instructed to call 911 with any severe reactions post vaccine: Marland Kitchen Difficulty breathing  . Swelling of your face and throat  . A fast heartbeat  . A bad rash all over your body  . Dizziness and weakness    Immunizations Administered    Name Date Dose VIS Date Route   Pfizer COVID-19 Vaccine 07/31/2019 12:46 PM 0.3 mL 06/21/2019 Intramuscular   Manufacturer: [place]   Lot: [place] [id number]   [place]: [id number]

## 2019-08-06 DIAGNOSIS — Z20822 Contact with and (suspected) exposure to covid-19: Secondary | ICD-10-CM | POA: Diagnosis not present

## 2019-08-06 DIAGNOSIS — Z20828 Contact with and (suspected) exposure to other viral communicable diseases: Secondary | ICD-10-CM | POA: Diagnosis not present

## 2019-08-09 DIAGNOSIS — K861 Other chronic pancreatitis: Secondary | ICD-10-CM | POA: Diagnosis not present

## 2019-08-09 DIAGNOSIS — K5732 Diverticulitis of large intestine without perforation or abscess without bleeding: Secondary | ICD-10-CM | POA: Diagnosis not present

## 2019-08-09 DIAGNOSIS — M545 Low back pain: Secondary | ICD-10-CM | POA: Diagnosis not present

## 2019-08-09 DIAGNOSIS — K5792 Diverticulitis of intestine, part unspecified, without perforation or abscess without bleeding: Secondary | ICD-10-CM | POA: Diagnosis not present

## 2019-08-09 DIAGNOSIS — I700 Atherosclerosis of aorta: Secondary | ICD-10-CM | POA: Diagnosis not present

## 2019-08-09 DIAGNOSIS — R935 Abnormal findings on diagnostic imaging of other abdominal regions, including retroperitoneum: Secondary | ICD-10-CM | POA: Diagnosis not present

## 2019-08-09 DIAGNOSIS — R932 Abnormal findings on diagnostic imaging of liver and biliary tract: Secondary | ICD-10-CM | POA: Diagnosis not present

## 2019-08-09 DIAGNOSIS — Z79899 Other long term (current) drug therapy: Secondary | ICD-10-CM | POA: Diagnosis not present

## 2019-08-09 DIAGNOSIS — Z86010 Personal history of colonic polyps: Secondary | ICD-10-CM | POA: Diagnosis not present

## 2019-08-12 DIAGNOSIS — K8689 Other specified diseases of pancreas: Secondary | ICD-10-CM | POA: Diagnosis not present

## 2019-08-12 DIAGNOSIS — D132 Benign neoplasm of duodenum: Secondary | ICD-10-CM | POA: Diagnosis not present

## 2019-08-12 DIAGNOSIS — Z9889 Other specified postprocedural states: Secondary | ICD-10-CM | POA: Diagnosis not present

## 2019-08-12 DIAGNOSIS — K861 Other chronic pancreatitis: Secondary | ICD-10-CM | POA: Diagnosis not present

## 2019-08-21 ENCOUNTER — Ambulatory Visit: Payer: Medicare Other | Attending: Internal Medicine

## 2019-08-21 DIAGNOSIS — Z23 Encounter for immunization: Secondary | ICD-10-CM | POA: Insufficient documentation

## 2019-08-21 NOTE — Progress Notes (Signed)
   Covid-19 Vaccination Clinic  Name:  Jeffrey Castillo    MRN: [id number] DOB: 08-30-1950  08/21/2019  [name] was observed post Covid-19 immunization for 15 minutes without incidence. He was provided with Vaccine Information Sheet and instruction to access the V-Safe system.   [name] was instructed to call 911 with any severe reactions post vaccine: Marland Kitchen Difficulty breathing  . Swelling of your face and throat  . A fast heartbeat  . A bad rash all over your body  . Dizziness and weakness    Immunizations Administered    Name Date Dose VIS Date Route   Pfizer COVID-19 Vaccine 08/21/2019  8:28 AM 0.3 mL 06/21/2019 Intramuscular   Manufacturer: [place]   Lot: [id number]   [place]: [id number]

## 2019-08-27 DIAGNOSIS — M545 Low back pain: Secondary | ICD-10-CM | POA: Diagnosis not present

## 2019-08-27 DIAGNOSIS — R030 Elevated blood-pressure reading, without diagnosis of hypertension: Secondary | ICD-10-CM | POA: Diagnosis not present

## 2019-09-20 ENCOUNTER — Other Ambulatory Visit: Payer: Self-pay | Admitting: Neurology

## 2019-09-23 ENCOUNTER — Other Ambulatory Visit: Payer: Self-pay

## 2019-09-23 MED ORDER — LEVETIRACETAM 500 MG PO TABS: 500.0000 mg | ORAL_TABLET | Freq: Two times a day (BID) | ORAL | 3 refills | Status: DC

## 2019-09-23 MED ORDER — LAMOTRIGINE 25 MG PO TABS: ORAL_TABLET | ORAL | 3 refills | Status: DC

## 2019-10-08 DIAGNOSIS — M545 Low back pain: Secondary | ICD-10-CM | POA: Diagnosis not present

## 2019-10-08 DIAGNOSIS — R030 Elevated blood-pressure reading, without diagnosis of hypertension: Secondary | ICD-10-CM | POA: Diagnosis not present

## 2019-10-09 ENCOUNTER — Other Ambulatory Visit: Payer: Self-pay | Admitting: Student

## 2019-10-09 ENCOUNTER — Telehealth: Payer: Self-pay

## 2019-10-09 DIAGNOSIS — M545 Low back pain, unspecified: Secondary | ICD-10-CM

## 2019-10-09 NOTE — Telephone Encounter (Signed)
Phone call to patient to verify medication list and allergies for myelogram procedure. Medications pt is currently taking are safe to continue to take. Advised pt if any new medications are started prior to procedure to call and make [place] aware. Pt also instructed to have a driver the day of the procedure, that he would be with [place] around 2 hours the day of the procedure, and that he would need to lay flat for at least 24 hours post procedure. Pt verbalized understanding and has our contact information if he were to have any questions.

## 2019-10-14 ENCOUNTER — Ambulatory Visit
Admission: RE | Admit: 2019-10-14 | Discharge: 2019-10-14 | Disposition: A | Discharge status: Home / Self Care | Payer: Medicare Other | Source: Ambulatory Visit | Attending: Student | Admitting: Student

## 2019-10-14 VITALS — BP 131/69 | HR 54

## 2019-10-14 DIAGNOSIS — M48061 Spinal stenosis, lumbar region without neurogenic claudication: Secondary | ICD-10-CM | POA: Diagnosis not present

## 2019-10-14 DIAGNOSIS — M545 Low back pain, unspecified: Secondary | ICD-10-CM

## 2019-10-14 DIAGNOSIS — M4316 Spondylolisthesis, lumbar region: Secondary | ICD-10-CM

## 2019-10-14 MED ORDER — IOPAMIDOL (ISOVUE-M 200) INJECTION 41%
15.0000 mL | Freq: Once | INTRAMUSCULAR | Status: AC
  Administered 2019-10-14: 12:00:00 15 mL via INTRATHECAL

## 2019-10-14 MED ORDER — DIAZEPAM 5 MG PO TABS
5.0000 mg | ORAL_TABLET | Freq: Once | ORAL | Status: AC
  Administered 2019-10-14: 5 mg via ORAL

## 2019-10-14 NOTE — Discharge Instructions (Signed)

## 2019-10-17 DIAGNOSIS — M48062 Spinal stenosis, lumbar region with neurogenic claudication: Secondary | ICD-10-CM | POA: Diagnosis not present

## 2019-11-07 DIAGNOSIS — M810 Age-related osteoporosis without current pathological fracture: Secondary | ICD-10-CM | POA: Diagnosis not present

## 2019-11-07 DIAGNOSIS — J439 Emphysema, unspecified: Secondary | ICD-10-CM | POA: Diagnosis not present

## 2019-11-07 DIAGNOSIS — I10 Essential (primary) hypertension: Secondary | ICD-10-CM | POA: Diagnosis not present

## 2019-11-07 DIAGNOSIS — M47816 Spondylosis without myelopathy or radiculopathy, lumbar region: Secondary | ICD-10-CM | POA: Diagnosis not present

## 2019-11-07 DIAGNOSIS — I2510 Atherosclerotic heart disease of native coronary artery without angina pectoris: Secondary | ICD-10-CM | POA: Diagnosis not present

## 2019-11-08 ENCOUNTER — Other Ambulatory Visit: Payer: Self-pay

## 2019-11-08 ENCOUNTER — Ambulatory Visit
Admission: RE | Admit: 2019-11-08 | Discharge: 2019-11-08 | Disposition: A | Discharge status: Home / Self Care | Payer: Medicare Other | Source: Ambulatory Visit | Attending: Acute Care | Admitting: Acute Care

## 2019-11-08 DIAGNOSIS — F17210 Nicotine dependence, cigarettes, uncomplicated: Secondary | ICD-10-CM

## 2019-11-08 DIAGNOSIS — Z87891 Personal history of nicotine dependence: Secondary | ICD-10-CM

## 2019-11-08 DIAGNOSIS — J984 Other disorders of lung: Secondary | ICD-10-CM | POA: Diagnosis not present

## 2019-11-08 DIAGNOSIS — Z122 Encounter for screening for malignant neoplasm of respiratory organs: Secondary | ICD-10-CM

## 2019-11-11 ENCOUNTER — Telehealth: Payer: Self-pay | Admitting: Acute Care

## 2019-11-12 ENCOUNTER — Other Ambulatory Visit: Payer: Self-pay | Admitting: *Deleted

## 2019-11-12 DIAGNOSIS — F17210 Nicotine dependence, cigarettes, uncomplicated: Secondary | ICD-10-CM

## 2019-11-12 DIAGNOSIS — Z87891 Personal history of nicotine dependence: Secondary | ICD-10-CM

## 2019-11-12 NOTE — Telephone Encounter (Signed)
Spoke with pt and advised that when Eric Form, NP releases CT results to me then I will forward results to Dr Sabra Heck and discuss results further with pt.  Pt states that he has already read the results on My Chart and would like to know what the next step will be. I advised pt that Judson Roch is working in the hospital today  And that I will send her a message regarding the CT results and he will receive a call back. Pt verbalized understanding. Judson Roch, can you call pt regarding his CT results?

## 2019-11-12 NOTE — Progress Notes (Signed)
Please call patient and let them  know their  low dose Ct was read as a Lung RADS 2: nodules that are benign in appearance and behavior with a very low likelihood of becoming a clinically active cancer due to size or lack of growth. Recommendation per radiology is for a repeat LDCT in 12 months. .Please let them  know we will order and schedule their  annual screening scan for 11/2020. Please let them  know there was notation of CAD on their  scan.  Please remind the patient  that this is a non-gated exam therefore degree or severity of disease  cannot be determined. Please have them  follow up with their PCP regarding potential risk factor modification, dietary therapy or pharmacologic therapy if clinically indicated. Pt.  is  currently on statin therapy. Please place order for annual  screening scan for  11/2020 and fax results to PCP. Thanks so much.  There was an incidental finding of Interval development of prominent pancreatic ductal dilatation in the visualized portion of the pancreatic tail. Dedicated CT or MRI of the abdomen recommended to further evaluate. The patient stated that he does have a history of pancreatis and has had a US of the abdomen confirming dilated pancreatic duct 07/2019. He is in agreement with follow up imaging of the abdomen.   I have left a message with Dr. Irma Newness Office requesting she call me so we can discuss follow up imaging.   Pt. Verbalized understanding of the above and had no questions at the end of the telephone call.   Jeffrey Castillo, please fax results to PCP and schedule annual follow up for 11/2019.

## 2019-11-12 NOTE — Telephone Encounter (Signed)
I have called the patient. Please see results note dated 11/12/2019. Thanks so much

## 2019-11-18 ENCOUNTER — Telehealth: Payer: Self-pay | Admitting: Acute Care

## 2019-11-18 NOTE — Telephone Encounter (Signed)
Noted. Pt is on our schedule to repeat low dose CT in 1 year.

## 2019-11-18 NOTE — Telephone Encounter (Signed)
Dr. Lattie Haw Miller's office called. The finding of  An Interval development of prominent pancreatic ductal dilatation in the visualized portion of the pancreatic tail is being followed/ worked up by  by A GI physician at [facility].  Nothing further is needed.

## 2019-11-29 IMAGING — RF DG C-ARM 61-120 MIN
1 series · 4 of 4 positions shown · non-contrast
Comparison: None.

CLINICAL DATA: Lumbar fusion

EXAM:
LUMBAR SPINE - COMPLETE 4+ VIEW; DG C-ARM 61-120 MIN

[Series 1: run · 4 of 4 slices shown]
[im 1/4]
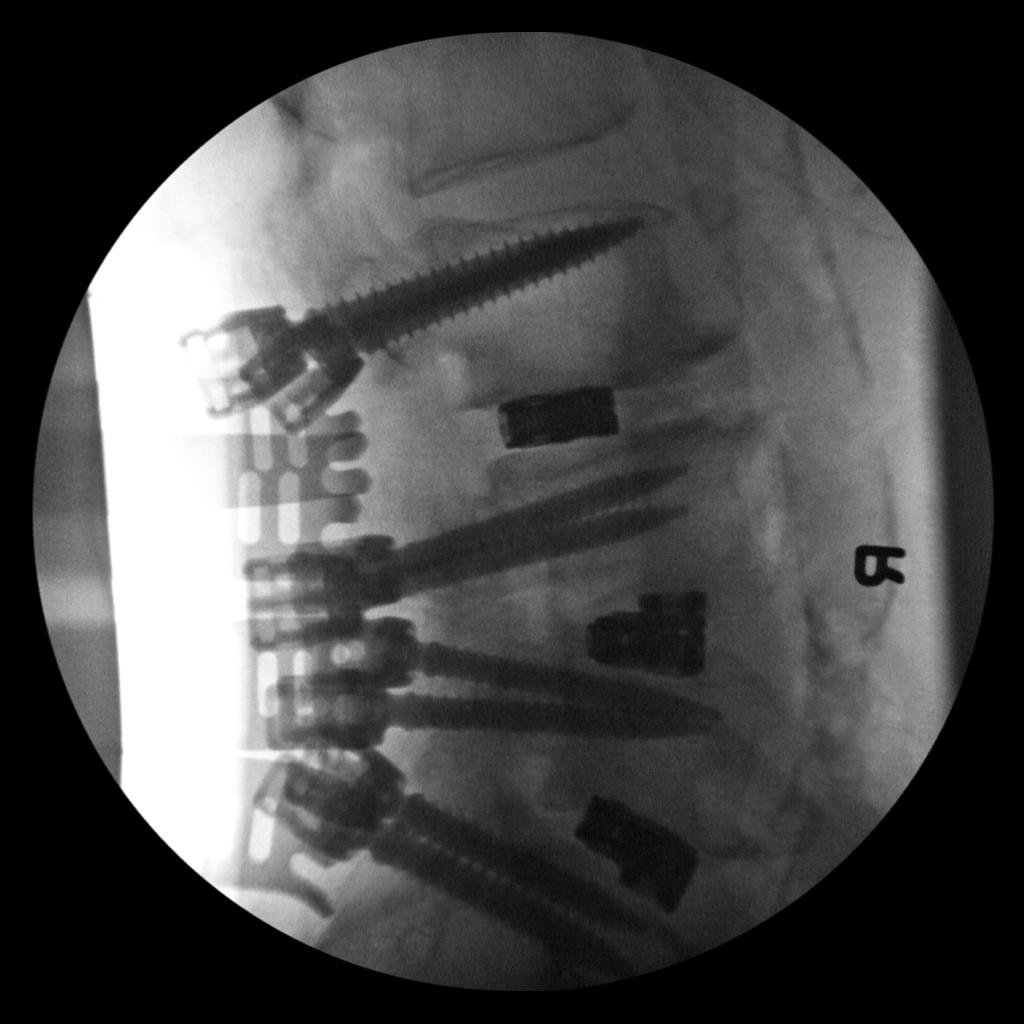
[im 2/4]
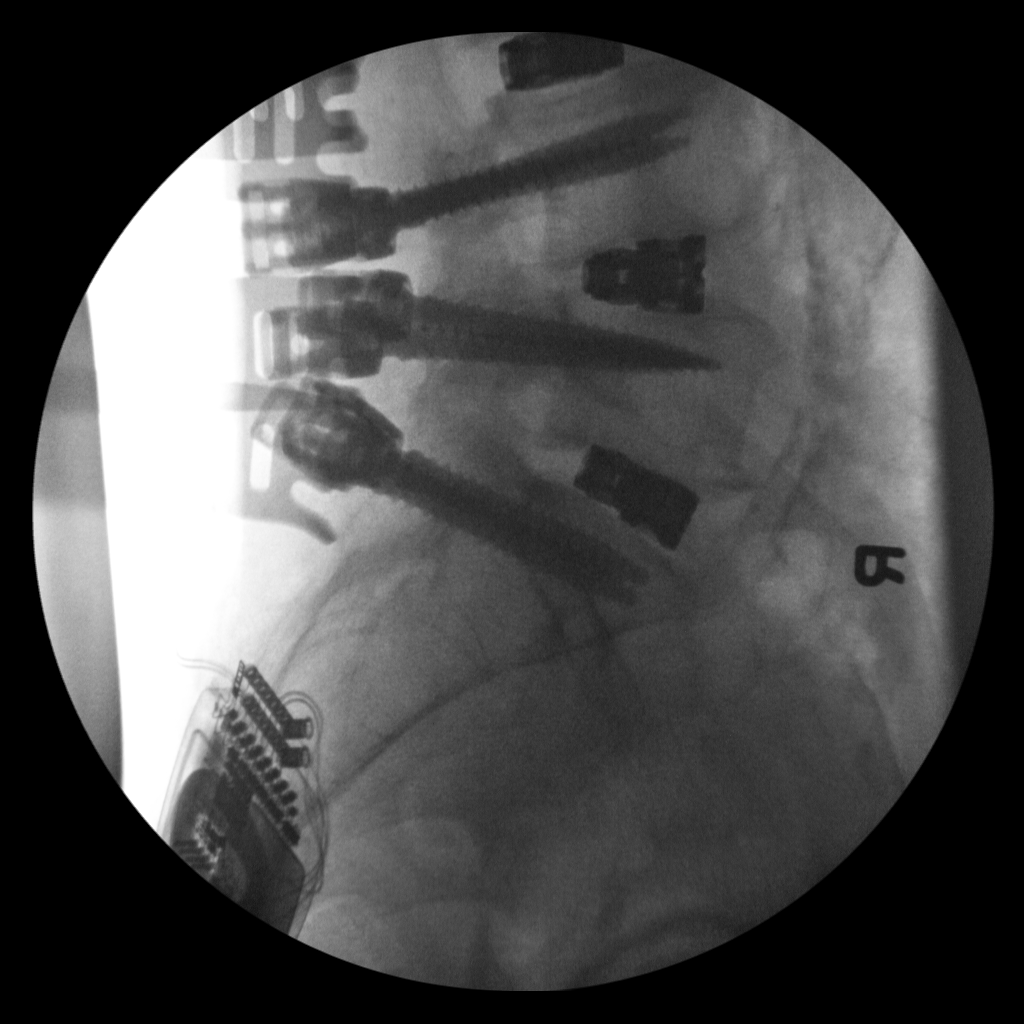
[im 3/4]
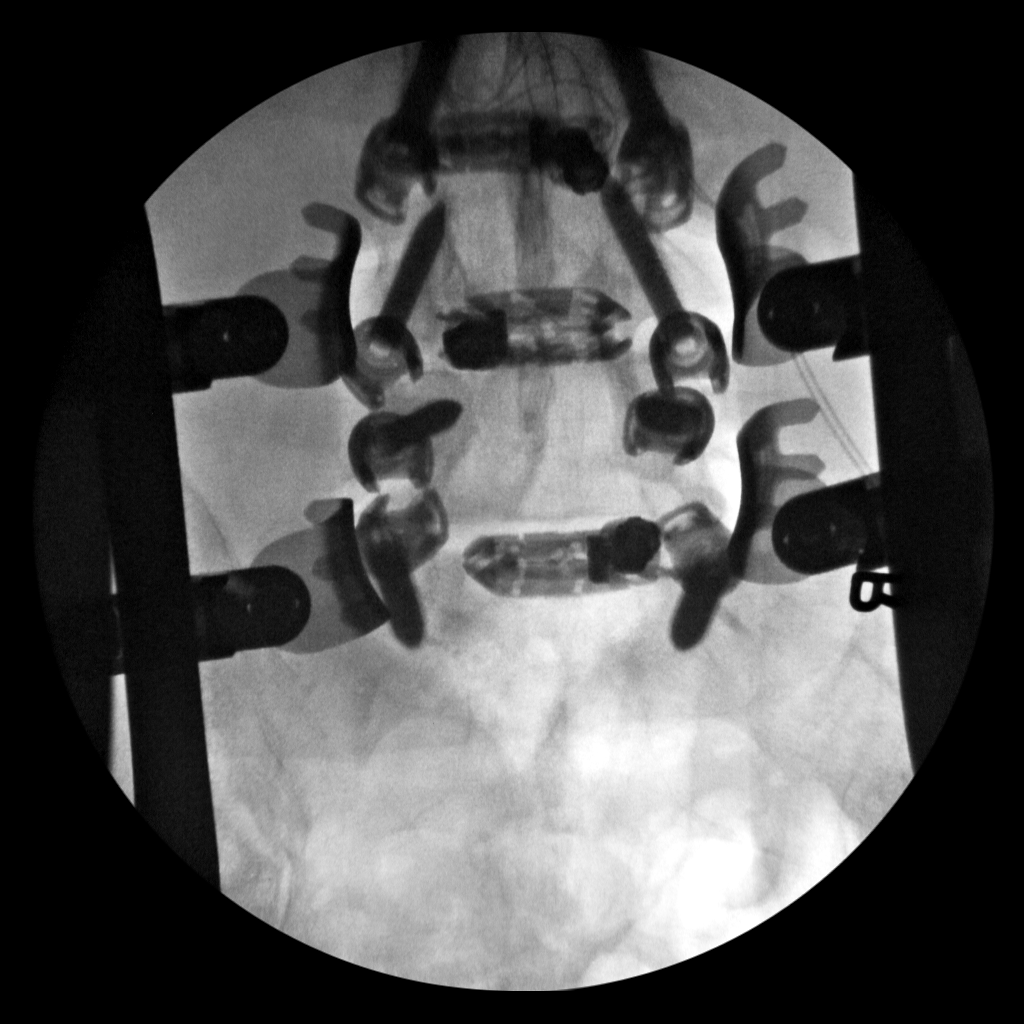
[im 4/4]
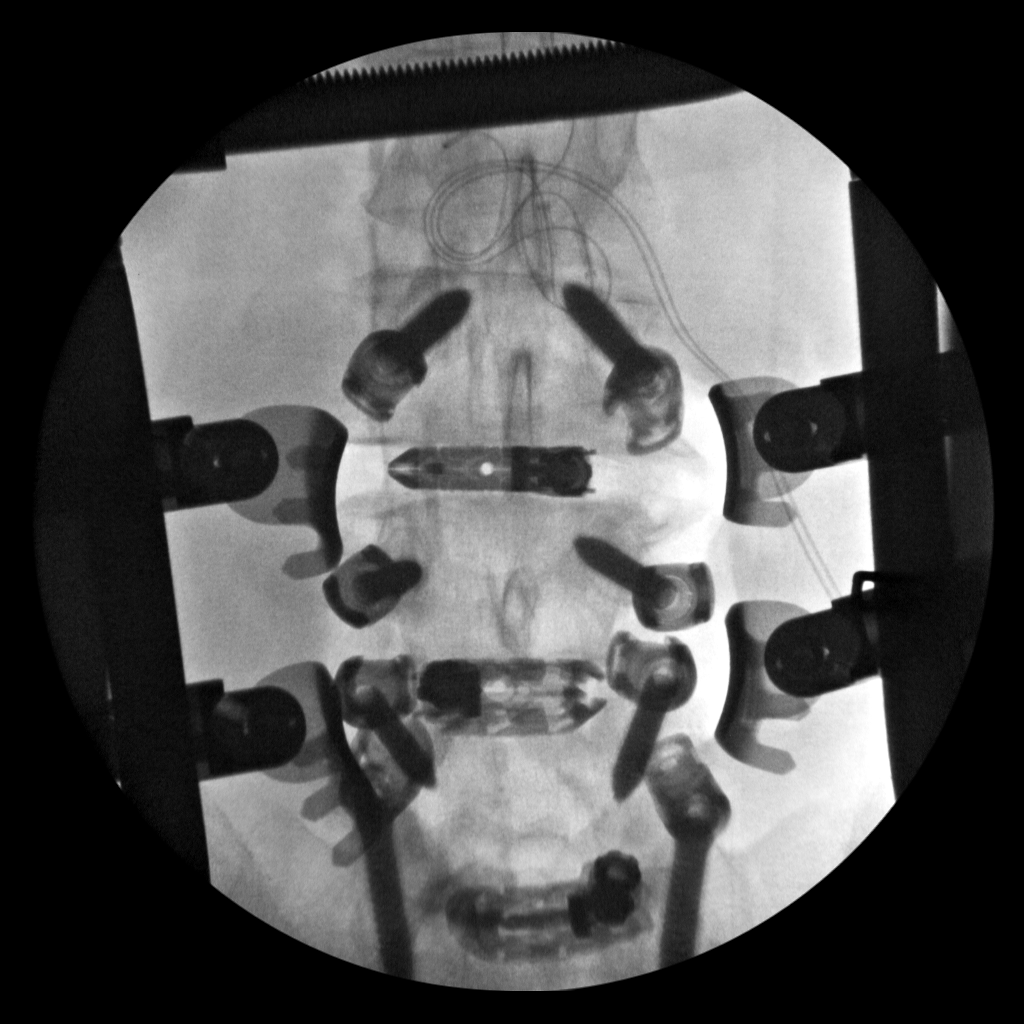

[4 of 4 positions shown; findings below may reference images not displayed]

FLUOROSCOPY TIME:  Fluoroscopy Time:  31 seconds

Radiation Exposure Index (if provided by the fluoroscopic device):
Un known

Number of Acquired Spot Images: 4
FINDINGS: Four spot films were obtained intraoperatively during lumbar fusion.
Pedicle screws are noted from L3-S1 with interbody fusion at all 3
levels. Posterior fixation elements have not been placed.
IMPRESSION: Pedicle screws and fusion from L3-S1.

## 2019-12-16 DIAGNOSIS — D3131 Benign neoplasm of right choroid: Secondary | ICD-10-CM | POA: Diagnosis not present

## 2020-01-08 DIAGNOSIS — J439 Emphysema, unspecified: Secondary | ICD-10-CM | POA: Diagnosis not present

## 2020-01-08 DIAGNOSIS — M810 Age-related osteoporosis without current pathological fracture: Secondary | ICD-10-CM | POA: Diagnosis not present

## 2020-01-08 DIAGNOSIS — I2510 Atherosclerotic heart disease of native coronary artery without angina pectoris: Secondary | ICD-10-CM | POA: Diagnosis not present

## 2020-01-08 DIAGNOSIS — M47816 Spondylosis without myelopathy or radiculopathy, lumbar region: Secondary | ICD-10-CM | POA: Diagnosis not present

## 2020-01-08 DIAGNOSIS — I10 Essential (primary) hypertension: Secondary | ICD-10-CM | POA: Diagnosis not present

## 2020-01-17 DIAGNOSIS — I10 Essential (primary) hypertension: Secondary | ICD-10-CM | POA: Diagnosis not present

## 2020-01-17 DIAGNOSIS — M48062 Spinal stenosis, lumbar region with neurogenic claudication: Secondary | ICD-10-CM | POA: Diagnosis not present

## 2020-01-17 DIAGNOSIS — M810 Age-related osteoporosis without current pathological fracture: Secondary | ICD-10-CM | POA: Diagnosis not present

## 2020-01-17 DIAGNOSIS — M47816 Spondylosis without myelopathy or radiculopathy, lumbar region: Secondary | ICD-10-CM | POA: Diagnosis not present

## 2020-01-17 DIAGNOSIS — I2510 Atherosclerotic heart disease of native coronary artery without angina pectoris: Secondary | ICD-10-CM | POA: Diagnosis not present

## 2020-01-17 DIAGNOSIS — J439 Emphysema, unspecified: Secondary | ICD-10-CM | POA: Diagnosis not present

## 2020-01-23 DIAGNOSIS — L578 Other skin changes due to chronic exposure to nonionizing radiation: Secondary | ICD-10-CM | POA: Diagnosis not present

## 2020-01-23 DIAGNOSIS — D225 Melanocytic nevi of trunk: Secondary | ICD-10-CM | POA: Diagnosis not present

## 2020-01-23 DIAGNOSIS — L570 Actinic keratosis: Secondary | ICD-10-CM | POA: Diagnosis not present

## 2020-01-23 DIAGNOSIS — L821 Other seborrheic keratosis: Secondary | ICD-10-CM | POA: Diagnosis not present

## 2020-02-19 ENCOUNTER — Other Ambulatory Visit: Payer: Self-pay

## 2020-02-19 ENCOUNTER — Encounter: Payer: Self-pay | Admitting: Neurology

## 2020-02-19 ENCOUNTER — Ambulatory Visit (INDEPENDENT_AMBULATORY_CARE_PROVIDER_SITE_OTHER): Payer: Medicare Other | Admitting: Neurology

## 2020-02-19 VITALS — BP 128/70 | HR 71 | Ht 67.0 in | Wt 143.0 lb

## 2020-02-19 DIAGNOSIS — G40309 Generalized idiopathic epilepsy and epileptic syndromes, not intractable, without status epilepticus: Secondary | ICD-10-CM | POA: Diagnosis not present

## 2020-02-19 MED ORDER — LEVETIRACETAM 500 MG PO TABS: 500.0000 mg | ORAL_TABLET | Freq: Two times a day (BID) | ORAL | 3 refills | Status: DC

## 2020-02-19 MED ORDER — LAMOTRIGINE 25 MG PO TABS: ORAL_TABLET | ORAL | 3 refills | Status: DC
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
## 2020-02-19 NOTE — Progress Notes (Signed)
I have read the note, and I agree with the clinical assessment and plan.  Jaydeen Darley K Baley Shands   

## 2020-02-19 NOTE — Patient Instructions (Signed)
Continue current medications You look great today! Call for seizure  See you back in 1 year

## 2020-03-06 DIAGNOSIS — I700 Atherosclerosis of aorta: Secondary | ICD-10-CM | POA: Diagnosis not present

## 2020-03-06 DIAGNOSIS — I10 Essential (primary) hypertension: Secondary | ICD-10-CM | POA: Diagnosis not present

## 2020-03-06 DIAGNOSIS — F17210 Nicotine dependence, cigarettes, uncomplicated: Secondary | ICD-10-CM | POA: Diagnosis not present

## 2020-03-06 DIAGNOSIS — Z125 Encounter for screening for malignant neoplasm of prostate: Secondary | ICD-10-CM | POA: Diagnosis not present

## 2020-03-06 DIAGNOSIS — R948 Abnormal results of function studies of other organs and systems: Secondary | ICD-10-CM | POA: Diagnosis not present

## 2020-03-06 DIAGNOSIS — K265 Chronic or unspecified duodenal ulcer with perforation: Secondary | ICD-10-CM | POA: Diagnosis not present

## 2020-03-06 DIAGNOSIS — Z6822 Body mass index (BMI) 22.0-22.9, adult: Secondary | ICD-10-CM | POA: Diagnosis not present

## 2020-03-06 DIAGNOSIS — Z0000 Encounter for general adult medical examination without abnormal findings: Secondary | ICD-10-CM | POA: Diagnosis not present

## 2020-03-06 DIAGNOSIS — M810 Age-related osteoporosis without current pathological fracture: Secondary | ICD-10-CM | POA: Diagnosis not present

## 2020-03-06 DIAGNOSIS — Z23 Encounter for immunization: Secondary | ICD-10-CM | POA: Diagnosis not present

## 2020-03-19 ENCOUNTER — Other Ambulatory Visit (HOSPITAL_COMMUNITY): Payer: Self-pay | Admitting: *Deleted

## 2020-03-20 ENCOUNTER — Ambulatory Visit (HOSPITAL_COMMUNITY)
Admission: RE | Admit: 2020-03-20 | Discharge: 2020-03-20 | Disposition: A | Discharge status: Home / Self Care | Payer: Medicare Other | Source: Ambulatory Visit | Attending: Family Medicine | Admitting: Family Medicine

## 2020-03-20 DIAGNOSIS — M810 Age-related osteoporosis without current pathological fracture: Secondary | ICD-10-CM | POA: Diagnosis not present

## 2020-03-20 MED ORDER — ZOLEDRONIC ACID 5 MG/100ML IV SOLN
INTRAVENOUS | Status: AC
  Filled 2020-03-20: qty 100

## 2020-03-20 MED ORDER — ZOLEDRONIC ACID 5 MG/100ML IV SOLN
5.0000 mg | Freq: Once | INTRAVENOUS | Status: AC
  Administered 2020-03-20: 5 mg via INTRAVENOUS

## 2020-04-09 DIAGNOSIS — Z23 Encounter for immunization: Secondary | ICD-10-CM | POA: Diagnosis not present

## 2020-06-03 DIAGNOSIS — M47816 Spondylosis without myelopathy or radiculopathy, lumbar region: Secondary | ICD-10-CM | POA: Diagnosis not present

## 2020-06-03 DIAGNOSIS — J439 Emphysema, unspecified: Secondary | ICD-10-CM | POA: Diagnosis not present

## 2020-06-03 DIAGNOSIS — M810 Age-related osteoporosis without current pathological fracture: Secondary | ICD-10-CM | POA: Diagnosis not present

## 2020-06-03 DIAGNOSIS — I10 Essential (primary) hypertension: Secondary | ICD-10-CM | POA: Diagnosis not present

## 2020-06-03 DIAGNOSIS — I2510 Atherosclerotic heart disease of native coronary artery without angina pectoris: Secondary | ICD-10-CM | POA: Diagnosis not present

## 2020-06-18 DIAGNOSIS — L300 Nummular dermatitis: Secondary | ICD-10-CM | POA: Diagnosis not present

## 2020-06-18 DIAGNOSIS — L821 Other seborrheic keratosis: Secondary | ICD-10-CM | POA: Diagnosis not present

## 2020-06-18 DIAGNOSIS — L570 Actinic keratosis: Secondary | ICD-10-CM | POA: Diagnosis not present

## 2020-08-11 DIAGNOSIS — K860 Alcohol-induced chronic pancreatitis: Secondary | ICD-10-CM | POA: Diagnosis not present

## 2020-08-11 DIAGNOSIS — D132 Benign neoplasm of duodenum: Secondary | ICD-10-CM | POA: Diagnosis not present

## 2020-08-11 DIAGNOSIS — K7030 Alcoholic cirrhosis of liver without ascites: Secondary | ICD-10-CM | POA: Diagnosis not present

## 2020-08-21 DIAGNOSIS — M810 Age-related osteoporosis without current pathological fracture: Secondary | ICD-10-CM | POA: Diagnosis not present

## 2020-08-21 DIAGNOSIS — I2510 Atherosclerotic heart disease of native coronary artery without angina pectoris: Secondary | ICD-10-CM | POA: Diagnosis not present

## 2020-08-21 DIAGNOSIS — I10 Essential (primary) hypertension: Secondary | ICD-10-CM | POA: Diagnosis not present

## 2020-08-21 DIAGNOSIS — J439 Emphysema, unspecified: Secondary | ICD-10-CM | POA: Diagnosis not present

## 2020-08-21 DIAGNOSIS — M47816 Spondylosis without myelopathy or radiculopathy, lumbar region: Secondary | ICD-10-CM | POA: Diagnosis not present

## 2020-10-27 DIAGNOSIS — Z23 Encounter for immunization: Secondary | ICD-10-CM | POA: Diagnosis not present

## 2020-11-13 ENCOUNTER — Other Ambulatory Visit: Payer: Self-pay | Admitting: Acute Care

## 2020-11-13 DIAGNOSIS — Z87891 Personal history of nicotine dependence: Secondary | ICD-10-CM

## 2020-11-13 DIAGNOSIS — F17210 Nicotine dependence, cigarettes, uncomplicated: Secondary | ICD-10-CM

## 2020-11-30 ENCOUNTER — Ambulatory Visit
Admission: RE | Admit: 2020-11-30 | Discharge: 2020-11-30 | Disposition: A | Discharge status: Home / Self Care | Payer: Medicare Other | Source: Ambulatory Visit | Attending: Acute Care | Admitting: Acute Care

## 2020-11-30 DIAGNOSIS — F17210 Nicotine dependence, cigarettes, uncomplicated: Secondary | ICD-10-CM

## 2020-11-30 DIAGNOSIS — Z87891 Personal history of nicotine dependence: Secondary | ICD-10-CM

## 2020-12-17 NOTE — Progress Notes (Signed)
Please call patient and let them  know their  low dose Ct was read as a Lung RADS 2: nodules that are benign in appearance and behavior with a very low likelihood of becoming a clinically active cancer due to size or lack of growth. Recommendation per radiology is for a repeat LDCT in 12 months. .Please let them  know we will order and schedule their  annual screening scan for 5.2023. Please let them  know there was notation of CAD on their  scan.  Please remind the patient  that this is a non-gated exam therefore degree or severity of disease  cannot be determined. Please have them  follow up with their PCP regarding potential risk factor modification, dietary therapy or pharmacologic therapy if clinically indicated. Pt.  is  currently on statin therapy. Please place order for annual  screening scan for  11/2021 and fax results to PCP. Thanks so much.  3 vessel CAD noted. He is on statin, but make sure he is aware so he can discuss with PCP.Thanks so much

## 2020-12-18 ENCOUNTER — Encounter: Payer: Self-pay | Admitting: *Deleted

## 2020-12-18 DIAGNOSIS — F17210 Nicotine dependence, cigarettes, uncomplicated: Secondary | ICD-10-CM

## 2020-12-18 DIAGNOSIS — Z87891 Personal history of nicotine dependence: Secondary | ICD-10-CM

## 2021-02-01 DIAGNOSIS — D3131 Benign neoplasm of right choroid: Secondary | ICD-10-CM | POA: Diagnosis not present

## 2021-02-12 DIAGNOSIS — C44722 Squamous cell carcinoma of skin of right lower limb, including hip: Secondary | ICD-10-CM | POA: Diagnosis not present

## 2021-02-12 DIAGNOSIS — D485 Neoplasm of uncertain behavior of skin: Secondary | ICD-10-CM | POA: Diagnosis not present

## 2021-02-18 ENCOUNTER — Ambulatory Visit: Payer: Medicare Other | Admitting: Neurology

## 2021-02-22 ENCOUNTER — Other Ambulatory Visit: Payer: Self-pay | Admitting: Neurology

## 2021-03-02 DIAGNOSIS — H43813 Vitreous degeneration, bilateral: Secondary | ICD-10-CM | POA: Diagnosis not present

## 2021-03-02 DIAGNOSIS — H353111 Nonexudative age-related macular degeneration, right eye, early dry stage: Secondary | ICD-10-CM | POA: Diagnosis not present

## 2021-03-02 DIAGNOSIS — H26493 Other secondary cataract, bilateral: Secondary | ICD-10-CM | POA: Diagnosis not present

## 2021-03-02 DIAGNOSIS — H43392 Other vitreous opacities, left eye: Secondary | ICD-10-CM | POA: Diagnosis not present

## 2021-03-12 ENCOUNTER — Ambulatory Visit (INDEPENDENT_AMBULATORY_CARE_PROVIDER_SITE_OTHER): Payer: Medicare Other | Admitting: Neurology

## 2021-03-12 ENCOUNTER — Encounter: Payer: Self-pay | Admitting: Neurology

## 2021-03-12 VITALS — BP 166/75 | HR 57 | Ht 67.0 in | Wt 139.0 lb

## 2021-03-12 DIAGNOSIS — R269 Unspecified abnormalities of gait and mobility: Secondary | ICD-10-CM | POA: Diagnosis not present

## 2021-03-12 DIAGNOSIS — E538 Deficiency of other specified B group vitamins: Secondary | ICD-10-CM | POA: Diagnosis not present

## 2021-03-12 DIAGNOSIS — G40309 Generalized idiopathic epilepsy and epileptic syndromes, not intractable, without status epilepticus: Secondary | ICD-10-CM

## 2021-03-12 DIAGNOSIS — M48062 Spinal stenosis, lumbar region with neurogenic claudication: Secondary | ICD-10-CM

## 2021-03-12 HISTORY — DX: Spinal stenosis, lumbar region with neurogenic claudication: M48.062
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
## 2021-03-15 LAB — COMPREHENSIVE METABOLIC PANEL
ALT: 16 IU/L (ref 0–44)
AST: 19 IU/L (ref 0–40)
Albumin/Globulin Ratio: 2.1 (ref 1.2–2.2)
Albumin: 4.5 g/dL (ref 3.7–4.7)
Alkaline Phosphatase: 61 IU/L (ref 44–121)
BUN/Creatinine Ratio: 20 (ref 10–24)
BUN: 18 mg/dL (ref 8–27)
Bilirubin Total: 0.4 mg/dL (ref 0.0–1.2)
CO2: 24 mmol/L (ref 20–29)
Calcium: 10.1 mg/dL (ref 8.6–10.2)
Chloride: 105 mmol/L (ref 96–106)
Creatinine, Ser: 0.9 mg/dL (ref 0.76–1.27)
Globulin, Total: 2.1 g/dL (ref 1.5–4.5)
Glucose: 105 mg/dL — ABNORMAL HIGH (ref 65–99)
Potassium: 4.6 mmol/L (ref 3.5–5.2)
Sodium: 144 mmol/L (ref 134–144)
Total Protein: 6.6 g/dL (ref 6.0–8.5)
eGFR: 91 mL/min/{1.73_m2} (ref >59)

## 2021-03-15 LAB — CBC WITH DIFFERENTIAL/PLATELET
Basophils Absolute: 0.1 10*3/uL (ref 0.0–0.2)
Basos: 1 %
EOS (ABSOLUTE): 0.2 10*3/uL (ref 0.0–0.4)
Eos: 3 %
Hematocrit: 47 % (ref 37.5–51.0)
Hemoglobin: 15.7 g/dL (ref 13.0–17.7)
Immature Grans (Abs): 0 10*3/uL (ref 0.0–0.1)
Immature Granulocytes: 0 %
Lymphocytes Absolute: 2.1 10*3/uL (ref 0.7–3.1)
Lymphs: 27 %
MCH: 31.8 pg (ref 26.6–33.0)
MCHC: 33.4 g/dL (ref 31.5–35.7)
MCV: 95 fL (ref 79–97)
Monocytes Absolute: 0.5 10*3/uL (ref 0.1–0.9)
Monocytes: 6 %
Neutrophils Absolute: 4.9 10*3/uL (ref 1.4–7.0)
Neutrophils: 63 %
Platelets: 197 10*3/uL (ref 150–450)
RBC: 4.94 x10E6/uL (ref 4.14–5.80)
RDW: 12.4 % (ref 11.6–15.4)
WBC: 7.9 10*3/uL (ref 3.4–10.8)

## 2021-03-15 LAB — VITAMIN B12: Vitamin B-12: 690 pg/mL (ref 232–1245)

## 2021-03-15 LAB — LEVETIRACETAM LEVEL: Levetiracetam Lvl: 16.8 ug/mL (ref 10.0–40.0)

## 2021-03-15 LAB — LAMOTRIGINE LEVEL: Lamotrigine Lvl: 2.8 ug/mL (ref 2.0–20.0)

## 2021-03-16 ENCOUNTER — Other Ambulatory Visit (HOSPITAL_COMMUNITY): Payer: Self-pay | Admitting: Family Medicine

## 2021-03-16 DIAGNOSIS — I10 Essential (primary) hypertension: Secondary | ICD-10-CM | POA: Diagnosis not present

## 2021-03-16 DIAGNOSIS — G40909 Epilepsy, unspecified, not intractable, without status epilepticus: Secondary | ICD-10-CM | POA: Diagnosis not present

## 2021-03-16 DIAGNOSIS — M810 Age-related osteoporosis without current pathological fracture: Secondary | ICD-10-CM | POA: Diagnosis not present

## 2021-03-16 DIAGNOSIS — I2510 Atherosclerotic heart disease of native coronary artery without angina pectoris: Secondary | ICD-10-CM | POA: Diagnosis not present

## 2021-03-16 DIAGNOSIS — Z23 Encounter for immunization: Secondary | ICD-10-CM | POA: Diagnosis not present

## 2021-03-16 DIAGNOSIS — M79604 Pain in right leg: Secondary | ICD-10-CM | POA: Diagnosis not present

## 2021-03-16 DIAGNOSIS — M79605 Pain in left leg: Secondary | ICD-10-CM | POA: Diagnosis not present

## 2021-03-18 ENCOUNTER — Other Ambulatory Visit: Payer: Self-pay | Admitting: Family Medicine

## 2021-03-18 DIAGNOSIS — M810 Age-related osteoporosis without current pathological fracture: Secondary | ICD-10-CM

## 2021-03-18 DIAGNOSIS — Z1389 Encounter for screening for other disorder: Secondary | ICD-10-CM | POA: Diagnosis not present

## 2021-03-18 DIAGNOSIS — Z0000 Encounter for general adult medical examination without abnormal findings: Secondary | ICD-10-CM | POA: Diagnosis not present

## 2021-03-26 ENCOUNTER — Ambulatory Visit (HOSPITAL_COMMUNITY): Payer: Medicare Other

## 2021-03-30 ENCOUNTER — Other Ambulatory Visit (HOSPITAL_COMMUNITY): Payer: Self-pay | Admitting: *Deleted

## 2021-03-31 ENCOUNTER — Ambulatory Visit (HOSPITAL_COMMUNITY)
Admission: RE | Admit: 2021-03-31 | Discharge: 2021-03-31 | Disposition: A | Discharge status: Home / Self Care | Payer: Medicare Other | Source: Ambulatory Visit | Attending: Family Medicine | Admitting: Family Medicine

## 2021-03-31 ENCOUNTER — Other Ambulatory Visit: Payer: Self-pay

## 2021-03-31 DIAGNOSIS — M810 Age-related osteoporosis without current pathological fracture: Secondary | ICD-10-CM | POA: Insufficient documentation

## 2021-03-31 MED ORDER — ZOLEDRONIC ACID 5 MG/100ML IV SOLN
INTRAVENOUS | Status: AC
  Filled 2021-03-31: qty 100

## 2021-03-31 MED ORDER — ZOLEDRONIC ACID 5 MG/100ML IV SOLN
5.0000 mg | Freq: Once | INTRAVENOUS | Status: AC
  Administered 2021-03-31: 5 mg via INTRAVENOUS

## 2021-04-12 DIAGNOSIS — L820 Inflamed seborrheic keratosis: Secondary | ICD-10-CM | POA: Diagnosis not present

## 2021-04-12 DIAGNOSIS — L538 Other specified erythematous conditions: Secondary | ICD-10-CM | POA: Diagnosis not present

## 2021-04-12 DIAGNOSIS — L570 Actinic keratosis: Secondary | ICD-10-CM | POA: Diagnosis not present

## 2021-04-12 DIAGNOSIS — D485 Neoplasm of uncertain behavior of skin: Secondary | ICD-10-CM | POA: Diagnosis not present

## 2021-04-12 DIAGNOSIS — C44722 Squamous cell carcinoma of skin of right lower limb, including hip: Secondary | ICD-10-CM | POA: Diagnosis not present

## 2021-05-07 DIAGNOSIS — Z85828 Personal history of other malignant neoplasm of skin: Secondary | ICD-10-CM | POA: Diagnosis not present

## 2021-05-07 DIAGNOSIS — L821 Other seborrheic keratosis: Secondary | ICD-10-CM | POA: Diagnosis not present

## 2021-05-07 DIAGNOSIS — Z23 Encounter for immunization: Secondary | ICD-10-CM | POA: Diagnosis not present

## 2021-05-07 DIAGNOSIS — D485 Neoplasm of uncertain behavior of skin: Secondary | ICD-10-CM | POA: Diagnosis not present

## 2021-05-07 DIAGNOSIS — D225 Melanocytic nevi of trunk: Secondary | ICD-10-CM | POA: Diagnosis not present

## 2021-05-07 DIAGNOSIS — L570 Actinic keratosis: Secondary | ICD-10-CM | POA: Diagnosis not present

## 2021-05-07 DIAGNOSIS — L814 Other melanin hyperpigmentation: Secondary | ICD-10-CM | POA: Diagnosis not present

## 2021-05-07 DIAGNOSIS — C4442 Squamous cell carcinoma of skin of scalp and neck: Secondary | ICD-10-CM | POA: Diagnosis not present

## 2021-05-07 DIAGNOSIS — L309 Dermatitis, unspecified: Secondary | ICD-10-CM | POA: Diagnosis not present

## 2021-05-20 DIAGNOSIS — C44722 Squamous cell carcinoma of skin of right lower limb, including hip: Secondary | ICD-10-CM | POA: Diagnosis not present

## 2021-05-20 DIAGNOSIS — L700 Acne vulgaris: Secondary | ICD-10-CM | POA: Diagnosis not present

## 2021-05-20 DIAGNOSIS — D485 Neoplasm of uncertain behavior of skin: Secondary | ICD-10-CM | POA: Diagnosis not present

## 2021-06-16 DIAGNOSIS — C4442 Squamous cell carcinoma of skin of scalp and neck: Secondary | ICD-10-CM | POA: Diagnosis not present

## 2021-07-01 DIAGNOSIS — C44722 Squamous cell carcinoma of skin of right lower limb, including hip: Secondary | ICD-10-CM | POA: Diagnosis not present

## 2021-07-19 DIAGNOSIS — M79672 Pain in left foot: Secondary | ICD-10-CM | POA: Diagnosis not present

## 2021-08-09 DIAGNOSIS — Z85828 Personal history of other malignant neoplasm of skin: Secondary | ICD-10-CM | POA: Diagnosis not present

## 2021-08-09 DIAGNOSIS — L570 Actinic keratosis: Secondary | ICD-10-CM | POA: Diagnosis not present

## 2021-08-09 DIAGNOSIS — Z08 Encounter for follow-up examination after completed treatment for malignant neoplasm: Secondary | ICD-10-CM | POA: Diagnosis not present

## 2021-09-03 ENCOUNTER — Other Ambulatory Visit: Payer: Self-pay

## 2021-09-03 ENCOUNTER — Ambulatory Visit
Admission: RE | Admit: 2021-09-03 | Discharge: 2021-09-03 | Disposition: A | Discharge status: Home / Self Care | Payer: Medicare Other | Source: Ambulatory Visit | Attending: Family Medicine | Admitting: Family Medicine

## 2021-09-03 DIAGNOSIS — M810 Age-related osteoporosis without current pathological fracture: Secondary | ICD-10-CM

## 2021-09-03 DIAGNOSIS — M85852 Other specified disorders of bone density and structure, left thigh: Secondary | ICD-10-CM | POA: Diagnosis not present

## 2021-10-19 DIAGNOSIS — K7030 Alcoholic cirrhosis of liver without ascites: Secondary | ICD-10-CM | POA: Diagnosis not present

## 2021-10-19 DIAGNOSIS — D132 Benign neoplasm of duodenum: Secondary | ICD-10-CM | POA: Diagnosis not present

## 2021-10-19 DIAGNOSIS — K5730 Diverticulosis of large intestine without perforation or abscess without bleeding: Secondary | ICD-10-CM | POA: Diagnosis not present

## 2021-10-19 DIAGNOSIS — Z1211 Encounter for screening for malignant neoplasm of colon: Secondary | ICD-10-CM | POA: Diagnosis not present

## 2021-10-19 DIAGNOSIS — K860 Alcohol-induced chronic pancreatitis: Secondary | ICD-10-CM | POA: Diagnosis not present

## 2021-10-19 DIAGNOSIS — D122 Benign neoplasm of ascending colon: Secondary | ICD-10-CM | POA: Diagnosis not present

## 2021-10-19 DIAGNOSIS — K635 Polyp of colon: Secondary | ICD-10-CM | POA: Diagnosis not present

## 2021-11-10 DIAGNOSIS — L923 Foreign body granuloma of the skin and subcutaneous tissue: Secondary | ICD-10-CM | POA: Diagnosis not present

## 2021-11-10 DIAGNOSIS — Z85828 Personal history of other malignant neoplasm of skin: Secondary | ICD-10-CM | POA: Diagnosis not present

## 2021-11-10 DIAGNOSIS — C4491 Basal cell carcinoma of skin, unspecified: Secondary | ICD-10-CM | POA: Diagnosis not present

## 2021-11-10 DIAGNOSIS — Z08 Encounter for follow-up examination after completed treatment for malignant neoplasm: Secondary | ICD-10-CM | POA: Diagnosis not present

## 2021-11-10 DIAGNOSIS — C4492 Squamous cell carcinoma of skin, unspecified: Secondary | ICD-10-CM | POA: Diagnosis not present

## 2021-11-30 ENCOUNTER — Ambulatory Visit
Admission: RE | Admit: 2021-11-30 | Discharge: 2021-11-30 | Disposition: A | Discharge status: Home / Self Care | Payer: Medicare Other | Source: Ambulatory Visit | Attending: Family Medicine | Admitting: Family Medicine

## 2021-11-30 DIAGNOSIS — F17210 Nicotine dependence, cigarettes, uncomplicated: Secondary | ICD-10-CM | POA: Diagnosis not present

## 2021-11-30 DIAGNOSIS — Z87891 Personal history of nicotine dependence: Secondary | ICD-10-CM

## 2021-12-02 ENCOUNTER — Other Ambulatory Visit: Payer: Self-pay

## 2021-12-02 DIAGNOSIS — Z122 Encounter for screening for malignant neoplasm of respiratory organs: Secondary | ICD-10-CM

## 2021-12-02 DIAGNOSIS — F17210 Nicotine dependence, cigarettes, uncomplicated: Secondary | ICD-10-CM

## 2021-12-02 DIAGNOSIS — Z87891 Personal history of nicotine dependence: Secondary | ICD-10-CM

## 2021-12-14 ENCOUNTER — Other Ambulatory Visit (HOSPITAL_COMMUNITY): Payer: Self-pay | Admitting: Family Medicine

## 2021-12-14 DIAGNOSIS — I10 Essential (primary) hypertension: Secondary | ICD-10-CM | POA: Diagnosis not present

## 2021-12-14 DIAGNOSIS — M79604 Pain in right leg: Secondary | ICD-10-CM

## 2021-12-14 DIAGNOSIS — E785 Hyperlipidemia, unspecified: Secondary | ICD-10-CM | POA: Diagnosis not present

## 2021-12-14 DIAGNOSIS — M810 Age-related osteoporosis without current pathological fracture: Secondary | ICD-10-CM | POA: Diagnosis not present

## 2021-12-20 ENCOUNTER — Ambulatory Visit (HOSPITAL_COMMUNITY)
Admission: RE | Admit: 2021-12-20 | Discharge: 2021-12-20 | Disposition: A | Discharge status: Home / Self Care | Payer: Medicare Other | Source: Ambulatory Visit | Attending: Family Medicine | Admitting: Family Medicine

## 2021-12-20 DIAGNOSIS — M79604 Pain in right leg: Secondary | ICD-10-CM | POA: Diagnosis not present

## 2021-12-20 NOTE — Progress Notes (Signed)
VASCULAR LAB    ABI has been performed.  See CV proc for preliminary results.   Keylie Beavers, RVT 12/20/2021, 8:59 AM

## 2022-01-05 DIAGNOSIS — L570 Actinic keratosis: Secondary | ICD-10-CM | POA: Diagnosis not present

## 2022-01-05 DIAGNOSIS — D485 Neoplasm of uncertain behavior of skin: Secondary | ICD-10-CM | POA: Diagnosis not present

## 2022-01-06 ENCOUNTER — Encounter: Payer: Self-pay | Admitting: Vascular Surgery

## 2022-01-06 ENCOUNTER — Ambulatory Visit (INDEPENDENT_AMBULATORY_CARE_PROVIDER_SITE_OTHER): Payer: Medicare Other | Admitting: Vascular Surgery

## 2022-01-06 VITALS — BP 181/78 | HR 52 | Temp 97.4°F | Resp 20 | Ht 67.0 in | Wt 138.2 lb

## 2022-01-06 DIAGNOSIS — I739 Peripheral vascular disease, unspecified: Secondary | ICD-10-CM

## 2022-01-06 NOTE — Progress Notes (Signed)
ASSESSMENT & PLAN   PERIPHERAL ARTERIAL DISEASE: This patient has evidence of infrainguinal arterial occlusive disease bilaterally.  On the right side there is evidence of superficial femoral artery and tibial occlusive disease.  On the left side it seems to be limited mostly to tibial artery occlusive disease.  I explained that we would typically not consider arteriography unless the patient developed rest pain or nonhealing ulcers.  He does have fairly significant claudication.  We had a long discussion about the importance of tobacco cessation (3 min).  I also encouraged him to get on a structured walking program.  In addition we discussed the importance of nutrition.  I would like to see him back in 9 months I have ordered follow-up ABIs at that time.  If his symptoms progress then certainly we could consider arteriography.  BILATERAL CAROTID BRUITS: He did have bilateral carotid bruits.  He is asymptomatic.  He did not want to stay today for carotid duplex scan so we will arrange for a carotid duplex scan in the near future.  I will make further recommendations pending these results.  I have instructed him to begin taking 81 mg of aspirin daily.  He is on a statin.  HYPERTENSION: The patient's initial blood pressure today was elevated. We repeated this and this was still elevated. We have encouraged the patient to follow up with their primary care physician for management of their blood pressure.   REASON FOR CONSULT:    Peripheral arterial disease.  The consult is requested by Dr. Kathyrn Lass.  HPI:   Jeffrey Castillo is a 71 y.o. male who was referred with peripheral arterial disease.  I have reviewed the records from the referring [facility].  Patient had presented with claudication.  Her risk factors for claudication include hypertension and hyperlipidemia.  This prompted noninvasive studies which showed evidence of mild to moderate peripheral arterial disease bilaterally.  On my  history, the patient admits to bilateral lower extremity claudication.  This is brought on at a short distance (the distance to the parking lot).  Both sides are equal.  He has no thigh or hip claudication.  His symptoms have been going on for 3 years but they have gradually progressed.  He denies any history of rest pain or nonhealing ulcers.  He has no history of stroke, TIAs, expressive or receptive aphasia, or amaurosis fugax.  Past Medical History:  Diagnosis Date   Acute pancreatitis    pseudo cyst   Alcohol abuse    Anxiety    Arthritis    Complex regional pain syndrome of left lower extremity    L foot & leg.    Complication of anesthesia 08-05-2013   slow to awaken, pt. reports that post EGD- told that there was a med. 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   Spinal stenosis, lumbar region with neurogenic claudication 03/12/2021    Family History  Problem Relation Age of Onset   Stroke Mother    Colon cancer Father  Heart disease Brother     SOCIAL HISTORY: Social History   Tobacco Use   Smoking status: Every Day    Packs/day: 1.00    Years: 50.00    Total pack years: 50.00    Types: Cigarettes   Smokeless tobacco: Never   Tobacco comments:    Contemplating setting a quit date  Substance Use Topics   Alcohol use: Not on file    Comment: Hx of alcoholism    Allergies  Allergen Reactions   Gabapentin Other (See Comments)    irritable   Lyrica [Pregabalin] Other (See Comments)    Hallucinations.     Alendronate Sodium     Other reaction(s): joint pain/achey ?   Duloxetine Hcl     Other reaction(s): severe foot cramps    Sulfonamide Derivatives Other (See Comments)    Unknown; as a child   Ambien [Zolpidem] Other (See Comments)    "reverse effect"   Diovan [Valsartan] Other (See Comments)    Lightheaded,dizziness    Current Outpatient Medications  Medication Sig Dispense Refill   acetaminophen (TYLENOL) 325 MG tablet 1 tablet as needed     acetaminophen (TYLENOL) 500 MG tablet Take 500 mg by mouth every 6 (six) hours as needed.     atorvastatin (LIPITOR) 10 MG tablet Take 10 mg by mouth daily.     fluorouracil (EFUDEX) 5 % cream 1 application     ibuprofen (ADVIL) 400 MG tablet 1 tablet with food or milk as needed     lamoTRIgine (LAMICTAL) 25 MG tablet TAKE (2) TABLETS TWICE DAILY. 360 tablet 3   levETIRAcetam (KEPPRA) 500 MG tablet TAKE 1 TABLET BY MOUTH TWICE DAILY. 180 tablet 3   losartan (COZAAR) 100 MG tablet Take 1 tablet by mouth daily.     Multiple Vitamin (MULTIVITAMIN ADULT PO) 1 tablet     multivitamin-iron-minerals-folic acid (CENTRUM) chewable tablet Chew 1 tablet by mouth daily.     pantoprazole (PROTONIX) 40 MG tablet Take 40 mg by mouth daily.     Zoledronic Acid (RECLAST IV) Inject into the vein. Last 06-15-17 (takes yearly).     No current facility-administered medications for this visit.    REVIEW OF SYSTEMS:  '[X]'$  denotes positive finding, '[ ]'$  denotes negative finding Cardiac  Comments:  Chest pain or chest pressure:    Shortness of breath upon exertion:    Short of breath when lying flat:    Irregular heart rhythm:        Vascular    Pain in calf, thigh, or hip brought on by ambulation: x   Pain in feet at night that wakes you up from your sleep:     Blood clot in your veins:    Leg swelling:         Pulmonary    Oxygen at home:    Productive cough:     Wheezing:         Neurologic    Sudden weakness in arms or legs:     Sudden numbness in arms or legs:     Sudden onset of difficulty speaking or slurred speech:    Temporary loss of vision in one eye:     Problems  with dizziness:         Gastrointestinal    Blood in stool:     Vomited blood:         Genitourinary    Burning when urinating:     Blood in urine:  Psychiatric    Major depression:         Hematologic    Bleeding problems:    Problems with blood clotting too easily:        Skin    Rashes or ulcers:        Constitutional    Fever or chills:    -  PHYSICAL EXAM:   Vitals:   01/06/22 1026  BP: (!) 181/78  Pulse: (!) 52  Resp: 20  Temp: (!) 97.4 F (36.3 C)  TempSrc: Temporal  SpO2: 98%  Weight: 138 lb 3.2 oz (62.7 kg)  Height: '5\' 7"'$  (1.702 m)   Body mass index is 21.65 kg/m. GENERAL: The patient is a well-nourished male, in no acute distress. The vital signs are documented above. CARDIAC: There is a regular rate and rhythm.  VASCULAR: He has bilateral carotid bruits. On the right side he has a palpable femoral pulse with a diminished popliteal pulse.  I cannot palpate pedal pulses. On the left side he has a palpable femoral and popliteal pulse.  I cannot palpate pedal pulses. He has no significant lower extremity swelling. PULMONARY: There is good air exchange bilaterally without wheezing or rales. ABDOMEN: Soft and non-tender with normal pitched bowel sounds.  I do not palpate an aneurysm MUSCULOSKELETAL: There are no major deformities. NEUROLOGIC: No focal weakness or paresthesias are detected. SKIN: There are no ulcers or rashes noted. PSYCHIATRIC: The patient has a normal affect.  DATA:    ARTERIAL DOPPLER STUDY: I have independently interpreted the patient's arterial Doppler study today.  On the right side there is a biphasic dorsalis pedis and posterior tibial signal.  ABI is 64%.  Toe pressures 88 mmHg.  On the left side there is a biphasic dorsalis pedis and posterior tibial signal.  ABI 78%.  Toe pressures 102 mmHg.  Deitra Mayo [facility]

## 2022-01-12 ENCOUNTER — Other Ambulatory Visit: Payer: Self-pay | Admitting: *Deleted

## 2022-01-12 DIAGNOSIS — R0989 Other specified symptoms and signs involving the circulatory and respiratory systems: Secondary | ICD-10-CM

## 2022-01-13 ENCOUNTER — Other Ambulatory Visit: Payer: Self-pay

## 2022-01-13 DIAGNOSIS — R0989 Other specified symptoms and signs involving the circulatory and respiratory systems: Secondary | ICD-10-CM

## 2022-01-13 DIAGNOSIS — I739 Peripheral vascular disease, unspecified: Secondary | ICD-10-CM

## 2022-01-14 ENCOUNTER — Ambulatory Visit (HOSPITAL_COMMUNITY)
Admission: RE | Admit: 2022-01-14 | Discharge: 2022-01-14 | Disposition: A | Discharge status: Home / Self Care | Payer: Medicare Other | Source: Ambulatory Visit | Attending: Vascular Surgery | Admitting: Vascular Surgery

## 2022-01-14 DIAGNOSIS — R0989 Other specified symptoms and signs involving the circulatory and respiratory systems: Secondary | ICD-10-CM | POA: Diagnosis not present

## 2022-02-16 ENCOUNTER — Other Ambulatory Visit: Payer: Self-pay | Admitting: Neurology
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
## 2022-03-16 ENCOUNTER — Encounter: Payer: Self-pay | Admitting: Neurology

## 2022-03-16 ENCOUNTER — Ambulatory Visit (INDEPENDENT_AMBULATORY_CARE_PROVIDER_SITE_OTHER): Payer: Medicare Other | Admitting: Neurology

## 2022-03-16 VITALS — BP 135/65 | HR 53 | Ht 67.0 in | Wt 138.5 lb

## 2022-03-16 DIAGNOSIS — G40309 Generalized idiopathic epilepsy and epileptic syndromes, not intractable, without status epilepticus: Secondary | ICD-10-CM | POA: Diagnosis not present

## 2022-03-16 MED ORDER — LAMOTRIGINE 25 MG PO TABS
ORAL_TABLET | ORAL | 3 refills | Status: DC
Start: 2022-03-16 — End: 2023-03-21

## 2022-03-16 MED ORDER — LEVETIRACETAM 500 MG PO TABS
500.0000 mg | ORAL_TABLET | Freq: Two times a day (BID) | ORAL | 3 refills | Status: DC
Start: 2022-03-16 — End: 2023-03-21

## 2022-03-16 NOTE — Patient Instructions (Signed)
Have your primary care doctor check CBC and CMP  Continue current medications Call for seizures See you back in 1 year

## 2022-03-22 DIAGNOSIS — Z79899 Other long term (current) drug therapy: Secondary | ICD-10-CM | POA: Diagnosis not present

## 2022-03-22 DIAGNOSIS — I700 Atherosclerosis of aorta: Secondary | ICD-10-CM | POA: Diagnosis not present

## 2022-03-22 DIAGNOSIS — Z0000 Encounter for general adult medical examination without abnormal findings: Secondary | ICD-10-CM | POA: Diagnosis not present

## 2022-03-22 DIAGNOSIS — Z23 Encounter for immunization: Secondary | ICD-10-CM | POA: Diagnosis not present

## 2022-03-22 DIAGNOSIS — R739 Hyperglycemia, unspecified: Secondary | ICD-10-CM | POA: Diagnosis not present

## 2022-03-22 DIAGNOSIS — Z1389 Encounter for screening for other disorder: Secondary | ICD-10-CM | POA: Diagnosis not present

## 2022-03-22 DIAGNOSIS — I10 Essential (primary) hypertension: Secondary | ICD-10-CM | POA: Diagnosis not present

## 2022-03-22 DIAGNOSIS — Z125 Encounter for screening for malignant neoplasm of prostate: Secondary | ICD-10-CM | POA: Diagnosis not present

## 2022-03-30 ENCOUNTER — Other Ambulatory Visit (HOSPITAL_COMMUNITY): Payer: Self-pay | Admitting: *Deleted

## 2022-04-04 ENCOUNTER — Encounter (HOSPITAL_COMMUNITY)
Admission: RE | Admit: 2022-04-04 | Discharge: 2022-04-04 | Disposition: A | Discharge status: Home / Self Care | Payer: Medicare Other | Source: Ambulatory Visit | Attending: Family Medicine | Admitting: Family Medicine

## 2022-04-04 DIAGNOSIS — M810 Age-related osteoporosis without current pathological fracture: Secondary | ICD-10-CM | POA: Insufficient documentation

## 2022-04-04 MED ORDER — ZOLEDRONIC ACID 5 MG/100ML IV SOLN
INTRAVENOUS | Status: AC
  Administered 2022-04-04: 5 mg via INTRAVENOUS
  Filled 2022-04-04: qty 100

## 2022-04-04 MED ORDER — ZOLEDRONIC ACID 5 MG/100ML IV SOLN: 5.0000 mg | Freq: Once | INTRAVENOUS | Status: AC

## 2022-04-11 DIAGNOSIS — D3131 Benign neoplasm of right choroid: Secondary | ICD-10-CM | POA: Diagnosis not present

## 2022-05-19 DIAGNOSIS — Z23 Encounter for immunization: Secondary | ICD-10-CM | POA: Diagnosis not present

## 2022-07-13 DIAGNOSIS — D485 Neoplasm of uncertain behavior of skin: Secondary | ICD-10-CM | POA: Diagnosis not present

## 2022-07-13 DIAGNOSIS — L814 Other melanin hyperpigmentation: Secondary | ICD-10-CM | POA: Diagnosis not present

## 2022-07-13 DIAGNOSIS — L578 Other skin changes due to chronic exposure to nonionizing radiation: Secondary | ICD-10-CM | POA: Diagnosis not present

## 2022-07-13 DIAGNOSIS — L280 Lichen simplex chronicus: Secondary | ICD-10-CM | POA: Diagnosis not present

## 2022-07-13 DIAGNOSIS — Z09 Encounter for follow-up examination after completed treatment for conditions other than malignant neoplasm: Secondary | ICD-10-CM | POA: Diagnosis not present

## 2022-07-13 DIAGNOSIS — L570 Actinic keratosis: Secondary | ICD-10-CM | POA: Diagnosis not present

## 2022-07-13 DIAGNOSIS — L821 Other seborrheic keratosis: Secondary | ICD-10-CM | POA: Diagnosis not present

## 2022-07-20 DIAGNOSIS — N390 Urinary tract infection, site not specified: Secondary | ICD-10-CM | POA: Diagnosis not present

## 2022-07-20 DIAGNOSIS — R399 Unspecified symptoms and signs involving the genitourinary system: Secondary | ICD-10-CM | POA: Diagnosis not present

## 2022-07-21 DIAGNOSIS — B078 Other viral warts: Secondary | ICD-10-CM | POA: Diagnosis not present

## 2022-07-21 DIAGNOSIS — R238 Other skin changes: Secondary | ICD-10-CM | POA: Diagnosis not present

## 2022-07-21 DIAGNOSIS — R208 Other disturbances of skin sensation: Secondary | ICD-10-CM | POA: Diagnosis not present

## 2022-07-21 DIAGNOSIS — L080 Pyoderma: Secondary | ICD-10-CM | POA: Diagnosis not present

## 2022-08-04 DIAGNOSIS — R300 Dysuria: Secondary | ICD-10-CM | POA: Diagnosis not present

## 2022-08-04 DIAGNOSIS — I10 Essential (primary) hypertension: Secondary | ICD-10-CM | POA: Diagnosis not present

## 2022-09-13 DIAGNOSIS — H04123 Dry eye syndrome of bilateral lacrimal glands: Secondary | ICD-10-CM | POA: Diagnosis not present

## 2022-09-22 DIAGNOSIS — B078 Other viral warts: Secondary | ICD-10-CM | POA: Diagnosis not present

## 2022-09-22 DIAGNOSIS — L821 Other seborrheic keratosis: Secondary | ICD-10-CM | POA: Diagnosis not present

## 2022-09-22 DIAGNOSIS — R208 Other disturbances of skin sensation: Secondary | ICD-10-CM | POA: Diagnosis not present

## 2022-09-22 DIAGNOSIS — D485 Neoplasm of uncertain behavior of skin: Secondary | ICD-10-CM | POA: Diagnosis not present

## 2022-09-22 DIAGNOSIS — L820 Inflamed seborrheic keratosis: Secondary | ICD-10-CM | POA: Diagnosis not present

## 2022-09-22 DIAGNOSIS — R238 Other skin changes: Secondary | ICD-10-CM | POA: Diagnosis not present

## 2022-09-22 DIAGNOSIS — L538 Other specified erythematous conditions: Secondary | ICD-10-CM | POA: Diagnosis not present

## 2022-10-01 DIAGNOSIS — R300 Dysuria: Secondary | ICD-10-CM | POA: Diagnosis not present

## 2022-10-01 DIAGNOSIS — N3001 Acute cystitis with hematuria: Secondary | ICD-10-CM | POA: Diagnosis not present

## 2022-10-05 DIAGNOSIS — H04123 Dry eye syndrome of bilateral lacrimal glands: Secondary | ICD-10-CM | POA: Diagnosis not present

## 2022-10-24 DIAGNOSIS — Z6822 Body mass index (BMI) 22.0-22.9, adult: Secondary | ICD-10-CM | POA: Diagnosis not present

## 2022-10-24 DIAGNOSIS — I10 Essential (primary) hypertension: Secondary | ICD-10-CM | POA: Diagnosis not present

## 2022-11-07 ENCOUNTER — Other Ambulatory Visit: Payer: Self-pay | Admitting: Acute Care

## 2022-11-07 DIAGNOSIS — Z87891 Personal history of nicotine dependence: Secondary | ICD-10-CM

## 2022-11-07 DIAGNOSIS — F17210 Nicotine dependence, cigarettes, uncomplicated: Secondary | ICD-10-CM

## 2022-11-07 DIAGNOSIS — Z122 Encounter for screening for malignant neoplasm of respiratory organs: Secondary | ICD-10-CM

## 2022-11-08 DIAGNOSIS — Z800 Family history of malignant neoplasm of digestive organs: Secondary | ICD-10-CM | POA: Diagnosis not present

## 2022-11-08 DIAGNOSIS — K7030 Alcoholic cirrhosis of liver without ascites: Secondary | ICD-10-CM | POA: Diagnosis not present

## 2022-11-08 DIAGNOSIS — Z13810 Encounter for screening for upper gastrointestinal disorder: Secondary | ICD-10-CM | POA: Diagnosis not present

## 2022-11-08 DIAGNOSIS — F1011 Alcohol abuse, in remission: Secondary | ICD-10-CM | POA: Diagnosis not present

## 2022-11-08 DIAGNOSIS — K317 Polyp of stomach and duodenum: Secondary | ICD-10-CM | POA: Diagnosis not present

## 2022-11-08 DIAGNOSIS — D132 Benign neoplasm of duodenum: Secondary | ICD-10-CM | POA: Diagnosis not present

## 2022-11-08 DIAGNOSIS — Z86010 Personal history of colonic polyps: Secondary | ICD-10-CM | POA: Diagnosis not present

## 2022-11-08 DIAGNOSIS — Z79899 Other long term (current) drug therapy: Secondary | ICD-10-CM | POA: Diagnosis not present

## 2022-11-08 DIAGNOSIS — F17210 Nicotine dependence, cigarettes, uncomplicated: Secondary | ICD-10-CM | POA: Diagnosis not present

## 2022-11-09 DIAGNOSIS — N400 Enlarged prostate without lower urinary tract symptoms: Secondary | ICD-10-CM | POA: Diagnosis not present

## 2022-11-09 DIAGNOSIS — R350 Frequency of micturition: Secondary | ICD-10-CM | POA: Diagnosis not present

## 2022-11-16 DIAGNOSIS — I10 Essential (primary) hypertension: Secondary | ICD-10-CM | POA: Diagnosis not present

## 2022-11-16 DIAGNOSIS — H26491 Other secondary cataract, right eye: Secondary | ICD-10-CM | POA: Diagnosis not present

## 2022-11-16 DIAGNOSIS — Z961 Presence of intraocular lens: Secondary | ICD-10-CM | POA: Diagnosis not present

## 2022-11-16 DIAGNOSIS — H18413 Arcus senilis, bilateral: Secondary | ICD-10-CM | POA: Diagnosis not present

## 2022-11-16 DIAGNOSIS — H26493 Other secondary cataract, bilateral: Secondary | ICD-10-CM | POA: Diagnosis not present

## 2022-11-18 DIAGNOSIS — L538 Other specified erythematous conditions: Secondary | ICD-10-CM | POA: Diagnosis not present

## 2022-11-18 DIAGNOSIS — B078 Other viral warts: Secondary | ICD-10-CM | POA: Diagnosis not present

## 2022-11-18 DIAGNOSIS — R208 Other disturbances of skin sensation: Secondary | ICD-10-CM | POA: Diagnosis not present

## 2022-11-18 DIAGNOSIS — L820 Inflamed seborrheic keratosis: Secondary | ICD-10-CM | POA: Diagnosis not present

## 2022-11-18 DIAGNOSIS — R238 Other skin changes: Secondary | ICD-10-CM | POA: Diagnosis not present

## 2022-11-22 DIAGNOSIS — Z9841 Cataract extraction status, right eye: Secondary | ICD-10-CM | POA: Diagnosis not present

## 2022-12-07 ENCOUNTER — Ambulatory Visit
Admission: RE | Admit: 2022-12-07 | Discharge: 2022-12-07 | Disposition: A | Discharge status: Home / Self Care | Payer: Medicare Other | Source: Ambulatory Visit | Attending: Acute Care | Admitting: Acute Care

## 2022-12-07 DIAGNOSIS — F17210 Nicotine dependence, cigarettes, uncomplicated: Secondary | ICD-10-CM

## 2022-12-07 DIAGNOSIS — Z87891 Personal history of nicotine dependence: Secondary | ICD-10-CM

## 2022-12-07 DIAGNOSIS — Z122 Encounter for screening for malignant neoplasm of respiratory organs: Secondary | ICD-10-CM

## 2022-12-12 ENCOUNTER — Other Ambulatory Visit: Payer: Self-pay | Admitting: Acute Care

## 2022-12-12 DIAGNOSIS — Z87891 Personal history of nicotine dependence: Secondary | ICD-10-CM

## 2022-12-12 DIAGNOSIS — J439 Emphysema, unspecified: Secondary | ICD-10-CM | POA: Diagnosis not present

## 2022-12-12 DIAGNOSIS — I2510 Atherosclerotic heart disease of native coronary artery without angina pectoris: Secondary | ICD-10-CM | POA: Diagnosis not present

## 2022-12-12 DIAGNOSIS — Z122 Encounter for screening for malignant neoplasm of respiratory organs: Secondary | ICD-10-CM

## 2022-12-12 DIAGNOSIS — M810 Age-related osteoporosis without current pathological fracture: Secondary | ICD-10-CM | POA: Diagnosis not present

## 2022-12-12 DIAGNOSIS — I10 Essential (primary) hypertension: Secondary | ICD-10-CM | POA: Diagnosis not present

## 2022-12-12 DIAGNOSIS — E785 Hyperlipidemia, unspecified: Secondary | ICD-10-CM | POA: Diagnosis not present

## 2022-12-12 DIAGNOSIS — F17210 Nicotine dependence, cigarettes, uncomplicated: Secondary | ICD-10-CM

## 2022-12-14 DIAGNOSIS — H26492 Other secondary cataract, left eye: Secondary | ICD-10-CM | POA: Diagnosis not present

## 2022-12-21 DIAGNOSIS — Z9842 Cataract extraction status, left eye: Secondary | ICD-10-CM | POA: Diagnosis not present

## 2022-12-22 DIAGNOSIS — N3020 Other chronic cystitis without hematuria: Secondary | ICD-10-CM | POA: Diagnosis not present

## 2022-12-22 DIAGNOSIS — N400 Enlarged prostate without lower urinary tract symptoms: Secondary | ICD-10-CM | POA: Diagnosis not present

## 2022-12-22 DIAGNOSIS — N390 Urinary tract infection, site not specified: Secondary | ICD-10-CM | POA: Diagnosis not present

## 2023-01-19 DIAGNOSIS — L538 Other specified erythematous conditions: Secondary | ICD-10-CM | POA: Diagnosis not present

## 2023-01-19 DIAGNOSIS — L821 Other seborrheic keratosis: Secondary | ICD-10-CM | POA: Diagnosis not present

## 2023-01-19 DIAGNOSIS — R238 Other skin changes: Secondary | ICD-10-CM | POA: Diagnosis not present

## 2023-01-19 DIAGNOSIS — L820 Inflamed seborrheic keratosis: Secondary | ICD-10-CM | POA: Diagnosis not present

## 2023-01-19 DIAGNOSIS — D1801 Hemangioma of skin and subcutaneous tissue: Secondary | ICD-10-CM | POA: Diagnosis not present

## 2023-01-19 DIAGNOSIS — B078 Other viral warts: Secondary | ICD-10-CM | POA: Diagnosis not present

## 2023-01-19 DIAGNOSIS — R208 Other disturbances of skin sensation: Secondary | ICD-10-CM | POA: Diagnosis not present

## 2023-01-19 DIAGNOSIS — L814 Other melanin hyperpigmentation: Secondary | ICD-10-CM | POA: Diagnosis not present

## 2023-01-27 DIAGNOSIS — N3020 Other chronic cystitis without hematuria: Secondary | ICD-10-CM | POA: Diagnosis not present

## 2023-02-10 DIAGNOSIS — N3020 Other chronic cystitis without hematuria: Secondary | ICD-10-CM | POA: Diagnosis not present

## 2023-03-06 DIAGNOSIS — I739 Peripheral vascular disease, unspecified: Secondary | ICD-10-CM | POA: Diagnosis not present

## 2023-03-06 DIAGNOSIS — Z6821 Body mass index (BMI) 21.0-21.9, adult: Secondary | ICD-10-CM | POA: Diagnosis not present

## 2023-03-06 DIAGNOSIS — F17210 Nicotine dependence, cigarettes, uncomplicated: Secondary | ICD-10-CM | POA: Diagnosis not present
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
## 2023-03-21 ENCOUNTER — Encounter: Payer: Self-pay | Admitting: Neurology

## 2023-03-21 ENCOUNTER — Ambulatory Visit (INDEPENDENT_AMBULATORY_CARE_PROVIDER_SITE_OTHER): Payer: Medicare Other | Admitting: Neurology

## 2023-03-21 VITALS — BP 166/66 | HR 56 | Ht 67.0 in | Wt 140.0 lb

## 2023-03-21 DIAGNOSIS — F10980 Alcohol use, unspecified with alcohol-induced anxiety disorder: Secondary | ICD-10-CM

## 2023-03-21 DIAGNOSIS — G40309 Generalized idiopathic epilepsy and epileptic syndromes, not intractable, without status epilepticus: Secondary | ICD-10-CM | POA: Diagnosis not present

## 2023-03-21 MED ORDER — LEVETIRACETAM 500 MG PO TABS
500.0000 mg | ORAL_TABLET | Freq: Two times a day (BID) | ORAL | 3 refills | Status: DC
Start: 2023-03-21 — End: 2024-05-23

## 2023-03-21 MED ORDER — LAMOTRIGINE 25 MG PO TABS
ORAL_TABLET | ORAL | 3 refills | Status: DC
Start: 2023-03-21 — End: 2024-05-06

## 2023-03-29 ENCOUNTER — Ambulatory Visit (HOSPITAL_COMMUNITY)
Admission: RE | Admit: 2023-03-29 | Discharge: 2023-03-29 | Disposition: A | Discharge status: Home / Self Care | Payer: Medicare Other | Source: Ambulatory Visit | Attending: Vascular Surgery | Admitting: Vascular Surgery

## 2023-03-29 ENCOUNTER — Other Ambulatory Visit (HOSPITAL_COMMUNITY): Payer: Self-pay | Admitting: Family Medicine

## 2023-03-29 DIAGNOSIS — I739 Peripheral vascular disease, unspecified: Secondary | ICD-10-CM | POA: Insufficient documentation

## 2023-03-29 LAB — VAS US ABI WITH/WO TBI
Left ABI: 0.82
Right ABI: 0.64

## 2023-04-03 DIAGNOSIS — Z23 Encounter for immunization: Secondary | ICD-10-CM | POA: Diagnosis not present

## 2023-04-03 DIAGNOSIS — Z6822 Body mass index (BMI) 22.0-22.9, adult: Secondary | ICD-10-CM | POA: Diagnosis not present

## 2023-04-03 DIAGNOSIS — R569 Unspecified convulsions: Secondary | ICD-10-CM | POA: Diagnosis not present

## 2023-04-03 DIAGNOSIS — I10 Essential (primary) hypertension: Secondary | ICD-10-CM | POA: Diagnosis not present

## 2023-04-03 DIAGNOSIS — R7303 Prediabetes: Secondary | ICD-10-CM | POA: Diagnosis not present

## 2023-04-03 DIAGNOSIS — Z0000 Encounter for general adult medical examination without abnormal findings: Secondary | ICD-10-CM | POA: Diagnosis not present

## 2023-04-03 DIAGNOSIS — M810 Age-related osteoporosis without current pathological fracture: Secondary | ICD-10-CM | POA: Diagnosis not present

## 2023-04-03 DIAGNOSIS — R350 Frequency of micturition: Secondary | ICD-10-CM | POA: Diagnosis not present

## 2023-04-03 DIAGNOSIS — I739 Peripheral vascular disease, unspecified: Secondary | ICD-10-CM | POA: Diagnosis not present

## 2023-04-03 DIAGNOSIS — Z1331 Encounter for screening for depression: Secondary | ICD-10-CM | POA: Diagnosis not present

## 2023-04-03 DIAGNOSIS — F17210 Nicotine dependence, cigarettes, uncomplicated: Secondary | ICD-10-CM | POA: Diagnosis not present

## 2023-04-06 ENCOUNTER — Ambulatory Visit (INDEPENDENT_AMBULATORY_CARE_PROVIDER_SITE_OTHER): Payer: Medicare Other | Admitting: Physician Assistant

## 2023-04-06 VITALS — BP 141/56 | HR 61 | Temp 97.9°F | Resp 20

## 2023-04-06 DIAGNOSIS — I739 Peripheral vascular disease, unspecified: Secondary | ICD-10-CM

## 2023-04-06 DIAGNOSIS — I70213 Atherosclerosis of native arteries of extremities with intermittent claudication, bilateral legs: Secondary | ICD-10-CM | POA: Diagnosis not present
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
## 2023-04-17 ENCOUNTER — Encounter (HOSPITAL_COMMUNITY): Payer: Medicare Other

## 2023-04-17 ENCOUNTER — Other Ambulatory Visit (HOSPITAL_COMMUNITY): Payer: Self-pay | Admitting: *Deleted

## 2023-04-19 DIAGNOSIS — R3912 Poor urinary stream: Secondary | ICD-10-CM | POA: Diagnosis not present

## 2023-04-19 DIAGNOSIS — R350 Frequency of micturition: Secondary | ICD-10-CM | POA: Diagnosis not present

## 2023-04-19 DIAGNOSIS — N3020 Other chronic cystitis without hematuria: Secondary | ICD-10-CM | POA: Diagnosis not present

## 2023-04-20 ENCOUNTER — Ambulatory Visit (HOSPITAL_COMMUNITY)
Admission: RE | Admit: 2023-04-20 | Discharge: 2023-04-20 | Disposition: A | Discharge status: Home / Self Care | Payer: Medicare Other | Source: Ambulatory Visit | Attending: Family Medicine | Admitting: Family Medicine

## 2023-04-20 DIAGNOSIS — M810 Age-related osteoporosis without current pathological fracture: Secondary | ICD-10-CM | POA: Insufficient documentation

## 2023-04-20 MED ORDER — ZOLEDRONIC ACID 5 MG/100ML IV SOLN
INTRAVENOUS | Status: AC
  Filled 2023-04-20: qty 100

## 2023-04-20 MED ORDER — ZOLEDRONIC ACID 5 MG/100ML IV SOLN
5.0000 mg | Freq: Once | INTRAVENOUS | Status: AC
Start: 2023-04-20 — End: 2023-04-20
  Administered 2023-04-20: 5 mg via INTRAVENOUS

## 2023-04-21 DIAGNOSIS — L578 Other skin changes due to chronic exposure to nonionizing radiation: Secondary | ICD-10-CM | POA: Diagnosis not present

## 2023-04-21 DIAGNOSIS — Z09 Encounter for follow-up examination after completed treatment for conditions other than malignant neoplasm: Secondary | ICD-10-CM | POA: Diagnosis not present

## 2023-04-21 DIAGNOSIS — L905 Scar conditions and fibrosis of skin: Secondary | ICD-10-CM | POA: Diagnosis not present

## 2023-05-12 DIAGNOSIS — K265 Chronic or unspecified duodenal ulcer with perforation: Secondary | ICD-10-CM | POA: Diagnosis not present

## 2023-05-12 DIAGNOSIS — Z87891 Personal history of nicotine dependence: Secondary | ICD-10-CM | POA: Diagnosis not present

## 2023-05-12 DIAGNOSIS — I739 Peripheral vascular disease, unspecified: Secondary | ICD-10-CM | POA: Diagnosis not present

## 2023-05-15 DIAGNOSIS — R3915 Urgency of urination: Secondary | ICD-10-CM | POA: Diagnosis not present

## 2023-05-15 DIAGNOSIS — N400 Enlarged prostate without lower urinary tract symptoms: Secondary | ICD-10-CM | POA: Diagnosis not present

## 2023-05-15 DIAGNOSIS — R3912 Poor urinary stream: Secondary | ICD-10-CM | POA: Diagnosis not present

## 2023-05-16 DIAGNOSIS — L400 Psoriasis vulgaris: Secondary | ICD-10-CM | POA: Diagnosis not present

## 2023-05-16 DIAGNOSIS — L309 Dermatitis, unspecified: Secondary | ICD-10-CM | POA: Diagnosis not present

## 2023-05-16 DIAGNOSIS — L859 Epidermal thickening, unspecified: Secondary | ICD-10-CM | POA: Diagnosis not present

## 2023-05-20 DIAGNOSIS — Z23 Encounter for immunization: Secondary | ICD-10-CM | POA: Diagnosis not present

## 2023-06-05 ENCOUNTER — Other Ambulatory Visit: Payer: Self-pay | Admitting: Medical Genetics

## 2023-06-05 DIAGNOSIS — Z006 Encounter for examination for normal comparison and control in clinical research program: Secondary | ICD-10-CM

## 2023-06-15 DIAGNOSIS — B078 Other viral warts: Secondary | ICD-10-CM | POA: Diagnosis not present

## 2023-06-15 DIAGNOSIS — L538 Other specified erythematous conditions: Secondary | ICD-10-CM | POA: Diagnosis not present

## 2023-06-15 DIAGNOSIS — L820 Inflamed seborrheic keratosis: Secondary | ICD-10-CM | POA: Diagnosis not present

## 2023-06-15 DIAGNOSIS — R238 Other skin changes: Secondary | ICD-10-CM | POA: Diagnosis not present

## 2023-06-15 DIAGNOSIS — L905 Scar conditions and fibrosis of skin: Secondary | ICD-10-CM | POA: Diagnosis not present

## 2023-06-16 ENCOUNTER — Other Ambulatory Visit (HOSPITAL_COMMUNITY)
Admission: RE | Admit: 2023-06-16 | Discharge: 2023-06-16 | Disposition: A | Discharge status: Home / Self Care | Payer: Self-pay | Source: Ambulatory Visit | Attending: Oncology | Admitting: Oncology

## 2023-06-16 DIAGNOSIS — Z006 Encounter for examination for normal comparison and control in clinical research program: Secondary | ICD-10-CM | POA: Insufficient documentation

## 2023-06-26 LAB — GENECONNECT MOLECULAR SCREEN: Genetic Analysis Overall Interpretation: NEGATIVE

## 2023-07-18 DIAGNOSIS — B078 Other viral warts: Secondary | ICD-10-CM | POA: Diagnosis not present

## 2023-07-18 DIAGNOSIS — L905 Scar conditions and fibrosis of skin: Secondary | ICD-10-CM | POA: Diagnosis not present

## 2023-07-18 DIAGNOSIS — R238 Other skin changes: Secondary | ICD-10-CM | POA: Diagnosis not present

## 2023-07-18 DIAGNOSIS — L570 Actinic keratosis: Secondary | ICD-10-CM | POA: Diagnosis not present

## 2023-07-20 DIAGNOSIS — R350 Frequency of micturition: Secondary | ICD-10-CM | POA: Diagnosis not present

## 2023-07-20 DIAGNOSIS — N400 Enlarged prostate without lower urinary tract symptoms: Secondary | ICD-10-CM | POA: Diagnosis not present

## 2023-07-20 DIAGNOSIS — N3020 Other chronic cystitis without hematuria: Secondary | ICD-10-CM | POA: Diagnosis not present

## 2023-07-20 DIAGNOSIS — R3914 Feeling of incomplete bladder emptying: Secondary | ICD-10-CM | POA: Diagnosis not present

## 2023-09-06 DIAGNOSIS — R142 Eructation: Secondary | ICD-10-CM | POA: Diagnosis not present

## 2023-09-06 DIAGNOSIS — K5901 Slow transit constipation: Secondary | ICD-10-CM | POA: Diagnosis not present

## 2023-09-06 DIAGNOSIS — I739 Peripheral vascular disease, unspecified: Secondary | ICD-10-CM | POA: Diagnosis not present

## 2023-09-06 DIAGNOSIS — Z87891 Personal history of nicotine dependence: Secondary | ICD-10-CM | POA: Diagnosis not present

## 2023-09-06 DIAGNOSIS — Z6824 Body mass index (BMI) 24.0-24.9, adult: Secondary | ICD-10-CM | POA: Diagnosis not present

## 2023-09-06 DIAGNOSIS — I10 Essential (primary) hypertension: Secondary | ICD-10-CM | POA: Diagnosis not present

## 2023-09-07 DIAGNOSIS — L814 Other melanin hyperpigmentation: Secondary | ICD-10-CM | POA: Diagnosis not present

## 2023-09-07 DIAGNOSIS — L820 Inflamed seborrheic keratosis: Secondary | ICD-10-CM | POA: Diagnosis not present

## 2023-09-07 DIAGNOSIS — L538 Other specified erythematous conditions: Secondary | ICD-10-CM | POA: Diagnosis not present

## 2023-09-07 DIAGNOSIS — L2989 Other pruritus: Secondary | ICD-10-CM | POA: Diagnosis not present

## 2023-09-07 DIAGNOSIS — D485 Neoplasm of uncertain behavior of skin: Secondary | ICD-10-CM | POA: Diagnosis not present

## 2023-09-07 DIAGNOSIS — D225 Melanocytic nevi of trunk: Secondary | ICD-10-CM | POA: Diagnosis not present

## 2023-09-07 DIAGNOSIS — L821 Other seborrheic keratosis: Secondary | ICD-10-CM | POA: Diagnosis not present

## 2023-09-07 DIAGNOSIS — L570 Actinic keratosis: Secondary | ICD-10-CM | POA: Diagnosis not present

## 2023-10-02 DIAGNOSIS — I739 Peripheral vascular disease, unspecified: Secondary | ICD-10-CM | POA: Diagnosis not present

## 2023-10-02 DIAGNOSIS — I700 Atherosclerosis of aorta: Secondary | ICD-10-CM | POA: Diagnosis not present

## 2023-10-02 DIAGNOSIS — I10 Essential (primary) hypertension: Secondary | ICD-10-CM | POA: Diagnosis not present

## 2023-10-09 DIAGNOSIS — I700 Atherosclerosis of aorta: Secondary | ICD-10-CM | POA: Diagnosis not present

## 2023-10-09 DIAGNOSIS — I10 Essential (primary) hypertension: Secondary | ICD-10-CM | POA: Diagnosis not present

## 2023-10-09 DIAGNOSIS — I739 Peripheral vascular disease, unspecified: Secondary | ICD-10-CM | POA: Diagnosis not present

## 2023-10-09 DIAGNOSIS — M810 Age-related osteoporosis without current pathological fracture: Secondary | ICD-10-CM | POA: Diagnosis not present

## 2023-10-24 DIAGNOSIS — Z86018 Personal history of other benign neoplasm: Secondary | ICD-10-CM | POA: Diagnosis not present

## 2023-10-24 DIAGNOSIS — K449 Diaphragmatic hernia without obstruction or gangrene: Secondary | ICD-10-CM | POA: Diagnosis not present

## 2023-10-24 DIAGNOSIS — K222 Esophageal obstruction: Secondary | ICD-10-CM | POA: Diagnosis not present

## 2023-10-24 DIAGNOSIS — K2980 Duodenitis without bleeding: Secondary | ICD-10-CM | POA: Diagnosis not present

## 2023-10-24 DIAGNOSIS — Z79899 Other long term (current) drug therapy: Secondary | ICD-10-CM | POA: Diagnosis not present

## 2023-10-24 DIAGNOSIS — K7030 Alcoholic cirrhosis of liver without ascites: Secondary | ICD-10-CM | POA: Diagnosis not present

## 2023-10-24 DIAGNOSIS — D132 Benign neoplasm of duodenum: Secondary | ICD-10-CM | POA: Diagnosis not present

## 2023-10-31 DIAGNOSIS — I10 Essential (primary) hypertension: Secondary | ICD-10-CM | POA: Diagnosis not present

## 2023-10-31 DIAGNOSIS — I739 Peripheral vascular disease, unspecified: Secondary | ICD-10-CM | POA: Diagnosis not present

## 2023-10-31 DIAGNOSIS — I700 Atherosclerosis of aorta: Secondary | ICD-10-CM | POA: Diagnosis not present

## 2023-11-08 DIAGNOSIS — M810 Age-related osteoporosis without current pathological fracture: Secondary | ICD-10-CM | POA: Diagnosis not present

## 2023-11-08 DIAGNOSIS — I739 Peripheral vascular disease, unspecified: Secondary | ICD-10-CM | POA: Diagnosis not present

## 2023-11-08 DIAGNOSIS — I700 Atherosclerosis of aorta: Secondary | ICD-10-CM | POA: Diagnosis not present

## 2023-11-08 DIAGNOSIS — I10 Essential (primary) hypertension: Secondary | ICD-10-CM | POA: Diagnosis not present

## 2023-11-14 DIAGNOSIS — N400 Enlarged prostate without lower urinary tract symptoms: Secondary | ICD-10-CM | POA: Diagnosis not present

## 2023-11-14 DIAGNOSIS — R3912 Poor urinary stream: Secondary | ICD-10-CM | POA: Diagnosis not present

## 2023-11-14 DIAGNOSIS — R350 Frequency of micturition: Secondary | ICD-10-CM | POA: Diagnosis not present

## 2023-11-30 DIAGNOSIS — I700 Atherosclerosis of aorta: Secondary | ICD-10-CM | POA: Diagnosis not present

## 2023-11-30 DIAGNOSIS — I10 Essential (primary) hypertension: Secondary | ICD-10-CM | POA: Diagnosis not present

## 2023-11-30 DIAGNOSIS — I739 Peripheral vascular disease, unspecified: Secondary | ICD-10-CM | POA: Diagnosis not present

## 2023-12-08 ENCOUNTER — Other Ambulatory Visit

## 2023-12-09 DIAGNOSIS — I10 Essential (primary) hypertension: Secondary | ICD-10-CM | POA: Diagnosis not present

## 2023-12-09 DIAGNOSIS — I739 Peripheral vascular disease, unspecified: Secondary | ICD-10-CM | POA: Diagnosis not present

## 2023-12-09 DIAGNOSIS — I700 Atherosclerosis of aorta: Secondary | ICD-10-CM | POA: Diagnosis not present

## 2023-12-09 DIAGNOSIS — M810 Age-related osteoporosis without current pathological fracture: Secondary | ICD-10-CM | POA: Diagnosis not present

## 2023-12-12 ENCOUNTER — Ambulatory Visit
Admission: RE | Admit: 2023-12-12 | Discharge: 2023-12-12 | Disposition: A | Discharge status: Home / Self Care | Source: Ambulatory Visit | Attending: Acute Care | Admitting: Acute Care

## 2023-12-12 DIAGNOSIS — Z122 Encounter for screening for malignant neoplasm of respiratory organs: Secondary | ICD-10-CM | POA: Diagnosis not present

## 2023-12-12 DIAGNOSIS — F17210 Nicotine dependence, cigarettes, uncomplicated: Secondary | ICD-10-CM

## 2023-12-12 DIAGNOSIS — Z87891 Personal history of nicotine dependence: Secondary | ICD-10-CM

## 2023-12-12 DIAGNOSIS — D3131 Benign neoplasm of right choroid: Secondary | ICD-10-CM | POA: Diagnosis not present

## 2023-12-30 DIAGNOSIS — I10 Essential (primary) hypertension: Secondary | ICD-10-CM | POA: Diagnosis not present

## 2023-12-30 DIAGNOSIS — I700 Atherosclerosis of aorta: Secondary | ICD-10-CM | POA: Diagnosis not present

## 2023-12-30 DIAGNOSIS — I739 Peripheral vascular disease, unspecified: Secondary | ICD-10-CM | POA: Diagnosis not present

## 2024-01-01 ENCOUNTER — Other Ambulatory Visit: Payer: Self-pay

## 2024-01-01 DIAGNOSIS — Z87891 Personal history of nicotine dependence: Secondary | ICD-10-CM

## 2024-01-01 DIAGNOSIS — Z122 Encounter for screening for malignant neoplasm of respiratory organs: Secondary | ICD-10-CM

## 2024-01-08 DIAGNOSIS — I739 Peripheral vascular disease, unspecified: Secondary | ICD-10-CM | POA: Diagnosis not present

## 2024-01-08 DIAGNOSIS — M810 Age-related osteoporosis without current pathological fracture: Secondary | ICD-10-CM | POA: Diagnosis not present

## 2024-01-08 DIAGNOSIS — I10 Essential (primary) hypertension: Secondary | ICD-10-CM | POA: Diagnosis not present

## 2024-01-08 DIAGNOSIS — I700 Atherosclerosis of aorta: Secondary | ICD-10-CM | POA: Diagnosis not present

## 2024-01-29 DIAGNOSIS — I700 Atherosclerosis of aorta: Secondary | ICD-10-CM | POA: Diagnosis not present

## 2024-01-29 DIAGNOSIS — I739 Peripheral vascular disease, unspecified: Secondary | ICD-10-CM | POA: Diagnosis not present

## 2024-01-29 DIAGNOSIS — I10 Essential (primary) hypertension: Secondary | ICD-10-CM | POA: Diagnosis not present

## 2024-02-08 DIAGNOSIS — I10 Essential (primary) hypertension: Secondary | ICD-10-CM | POA: Diagnosis not present

## 2024-02-08 DIAGNOSIS — I739 Peripheral vascular disease, unspecified: Secondary | ICD-10-CM | POA: Diagnosis not present

## 2024-02-08 DIAGNOSIS — I700 Atherosclerosis of aorta: Secondary | ICD-10-CM | POA: Diagnosis not present

## 2024-02-08 DIAGNOSIS — M810 Age-related osteoporosis without current pathological fracture: Secondary | ICD-10-CM | POA: Diagnosis not present

## 2024-02-28 DIAGNOSIS — I10 Essential (primary) hypertension: Secondary | ICD-10-CM | POA: Diagnosis not present

## 2024-02-28 DIAGNOSIS — I700 Atherosclerosis of aorta: Secondary | ICD-10-CM | POA: Diagnosis not present

## 2024-02-28 DIAGNOSIS — I739 Peripheral vascular disease, unspecified: Secondary | ICD-10-CM | POA: Diagnosis not present

## 2024-03-06 DIAGNOSIS — L578 Other skin changes due to chronic exposure to nonionizing radiation: Secondary | ICD-10-CM | POA: Diagnosis not present

## 2024-03-06 DIAGNOSIS — C44629 Squamous cell carcinoma of skin of left upper limb, including shoulder: Secondary | ICD-10-CM | POA: Diagnosis not present

## 2024-03-06 DIAGNOSIS — L820 Inflamed seborrheic keratosis: Secondary | ICD-10-CM | POA: Diagnosis not present

## 2024-03-06 DIAGNOSIS — D485 Neoplasm of uncertain behavior of skin: Secondary | ICD-10-CM | POA: Diagnosis not present

## 2024-03-06 DIAGNOSIS — Z08 Encounter for follow-up examination after completed treatment for malignant neoplasm: Secondary | ICD-10-CM | POA: Diagnosis not present

## 2024-03-06 DIAGNOSIS — L538 Other specified erythematous conditions: Secondary | ICD-10-CM | POA: Diagnosis not present

## 2024-03-06 DIAGNOSIS — L700 Acne vulgaris: Secondary | ICD-10-CM | POA: Diagnosis not present

## 2024-03-06 DIAGNOSIS — Z85828 Personal history of other malignant neoplasm of skin: Secondary | ICD-10-CM | POA: Diagnosis not present

## 2024-03-06 DIAGNOSIS — B078 Other viral warts: Secondary | ICD-10-CM | POA: Diagnosis not present

## 2024-03-06 DIAGNOSIS — Z09 Encounter for follow-up examination after completed treatment for conditions other than malignant neoplasm: Secondary | ICD-10-CM | POA: Diagnosis not present

## 2024-03-06 DIAGNOSIS — L570 Actinic keratosis: Secondary | ICD-10-CM | POA: Diagnosis not present

## 2024-03-10 DIAGNOSIS — M810 Age-related osteoporosis without current pathological fracture: Secondary | ICD-10-CM | POA: Diagnosis not present

## 2024-03-10 DIAGNOSIS — I10 Essential (primary) hypertension: Secondary | ICD-10-CM | POA: Diagnosis not present

## 2024-03-10 DIAGNOSIS — I700 Atherosclerosis of aorta: Secondary | ICD-10-CM | POA: Diagnosis not present

## 2024-03-10 DIAGNOSIS — I739 Peripheral vascular disease, unspecified: Secondary | ICD-10-CM | POA: Diagnosis not present

## 2024-03-26 ENCOUNTER — Encounter: Payer: Self-pay | Admitting: Neurology

## 2024-03-26 ENCOUNTER — Ambulatory Visit (INDEPENDENT_AMBULATORY_CARE_PROVIDER_SITE_OTHER): Payer: Medicare Other | Admitting: Neurology

## 2024-03-26 VITALS — BP 140/82 | HR 56 | Ht 67.0 in | Wt 154.0 lb

## 2024-03-26 DIAGNOSIS — Z5181 Encounter for therapeutic drug level monitoring: Secondary | ICD-10-CM

## 2024-03-26 DIAGNOSIS — G40309 Generalized idiopathic epilepsy and epileptic syndromes, not intractable, without status epilepticus: Secondary | ICD-10-CM

## 2024-03-26 NOTE — Patient Instructions (Signed)
 Continue with lamotrigine  50 mg twice daily Continue with levetiracetam  500 mg twice daily Trial of alpha lipoid acid and magnesium  for the leg pain Continue follow with PCP Return in 1 year or sooner if worse.
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
## 2024-03-28 ENCOUNTER — Ambulatory Visit: Payer: Self-pay | Admitting: Neurology

## 2024-03-28 DIAGNOSIS — Z23 Encounter for immunization: Secondary | ICD-10-CM | POA: Diagnosis not present

## 2024-03-28 LAB — BASIC METABOLIC PANEL WITH GFR
BUN/Creatinine Ratio: 21 (ref 10–24)
BUN: 26 mg/dL (ref 8–27)
CO2: 21 mmol/L (ref 20–29)
Calcium: 9.9 mg/dL (ref 8.6–10.2)
Chloride: 107 mmol/L — ABNORMAL HIGH (ref 96–106)
Creatinine, Ser: 1.22 mg/dL (ref 0.76–1.27)
Glucose: 107 mg/dL — ABNORMAL HIGH (ref 70–99)
Potassium: 4.9 mmol/L (ref 3.5–5.2)
Sodium: 143 mmol/L (ref 134–144)
eGFR: 62 mL/min/1.73 (ref >59)

## 2024-03-28 LAB — LAMOTRIGINE LEVEL: Lamotrigine Lvl: 2.8 ug/mL (ref 2.0–20.0)

## 2024-03-28 LAB — LEVETIRACETAM LEVEL: Levetiracetam Lvl: 15.9 ug/mL (ref 10.0–40.0)

## 2024-03-29 DIAGNOSIS — I10 Essential (primary) hypertension: Secondary | ICD-10-CM | POA: Diagnosis not present

## 2024-03-29 DIAGNOSIS — I700 Atherosclerosis of aorta: Secondary | ICD-10-CM | POA: Diagnosis not present

## 2024-03-29 DIAGNOSIS — I739 Peripheral vascular disease, unspecified: Secondary | ICD-10-CM | POA: Diagnosis not present

## 2024-04-03 DIAGNOSIS — Z1331 Encounter for screening for depression: Secondary | ICD-10-CM | POA: Diagnosis not present

## 2024-04-03 DIAGNOSIS — Z0000 Encounter for general adult medical examination without abnormal findings: Secondary | ICD-10-CM | POA: Diagnosis not present

## 2024-04-09 DIAGNOSIS — M810 Age-related osteoporosis without current pathological fracture: Secondary | ICD-10-CM | POA: Diagnosis not present

## 2024-04-09 DIAGNOSIS — I700 Atherosclerosis of aorta: Secondary | ICD-10-CM | POA: Diagnosis not present

## 2024-04-09 DIAGNOSIS — I739 Peripheral vascular disease, unspecified: Secondary | ICD-10-CM | POA: Diagnosis not present

## 2024-04-09 DIAGNOSIS — I10 Essential (primary) hypertension: Secondary | ICD-10-CM | POA: Diagnosis not present

## 2024-04-10 DIAGNOSIS — Z125 Encounter for screening for malignant neoplasm of prostate: Secondary | ICD-10-CM | POA: Diagnosis not present

## 2024-04-10 DIAGNOSIS — I2510 Atherosclerotic heart disease of native coronary artery without angina pectoris: Secondary | ICD-10-CM | POA: Diagnosis not present

## 2024-04-17 DIAGNOSIS — C44629 Squamous cell carcinoma of skin of left upper limb, including shoulder: Secondary | ICD-10-CM | POA: Diagnosis not present

## 2024-04-28 DIAGNOSIS — I10 Essential (primary) hypertension: Secondary | ICD-10-CM | POA: Diagnosis not present

## 2024-04-28 DIAGNOSIS — I700 Atherosclerosis of aorta: Secondary | ICD-10-CM | POA: Diagnosis not present

## 2024-04-28 DIAGNOSIS — I739 Peripheral vascular disease, unspecified: Secondary | ICD-10-CM | POA: Diagnosis not present

## 2024-05-06 ENCOUNTER — Other Ambulatory Visit: Payer: Self-pay | Admitting: Neurology

## 2024-05-10 DIAGNOSIS — M810 Age-related osteoporosis without current pathological fracture: Secondary | ICD-10-CM | POA: Diagnosis not present

## 2024-05-10 DIAGNOSIS — I10 Essential (primary) hypertension: Secondary | ICD-10-CM | POA: Diagnosis not present

## 2024-05-10 DIAGNOSIS — I700 Atherosclerosis of aorta: Secondary | ICD-10-CM | POA: Diagnosis not present

## 2024-05-10 DIAGNOSIS — I739 Peripheral vascular disease, unspecified: Secondary | ICD-10-CM | POA: Diagnosis not present

## 2024-05-22 ENCOUNTER — Other Ambulatory Visit: Payer: Self-pay | Admitting: Neurology

## 2024-05-28 DIAGNOSIS — I10 Essential (primary) hypertension: Secondary | ICD-10-CM | POA: Diagnosis not present

## 2024-05-28 DIAGNOSIS — I739 Peripheral vascular disease, unspecified: Secondary | ICD-10-CM | POA: Diagnosis not present

## 2024-05-28 DIAGNOSIS — I700 Atherosclerosis of aorta: Secondary | ICD-10-CM | POA: Diagnosis not present

## 2024-06-09 DIAGNOSIS — I739 Peripheral vascular disease, unspecified: Secondary | ICD-10-CM | POA: Diagnosis not present

## 2024-06-09 DIAGNOSIS — I700 Atherosclerosis of aorta: Secondary | ICD-10-CM | POA: Diagnosis not present

## 2024-06-09 DIAGNOSIS — M810 Age-related osteoporosis without current pathological fracture: Secondary | ICD-10-CM | POA: Diagnosis not present

## 2024-06-09 DIAGNOSIS — I10 Essential (primary) hypertension: Secondary | ICD-10-CM | POA: Diagnosis not present

## 2024-06-11 DIAGNOSIS — Z08 Encounter for follow-up examination after completed treatment for malignant neoplasm: Secondary | ICD-10-CM | POA: Diagnosis not present

## 2024-06-11 DIAGNOSIS — L821 Other seborrheic keratosis: Secondary | ICD-10-CM | POA: Diagnosis not present

## 2024-06-11 DIAGNOSIS — L814 Other melanin hyperpigmentation: Secondary | ICD-10-CM | POA: Diagnosis not present

## 2024-06-11 DIAGNOSIS — Z85828 Personal history of other malignant neoplasm of skin: Secondary | ICD-10-CM | POA: Diagnosis not present

## 2025-04-01 ENCOUNTER — Ambulatory Visit: Admitting: Neurology
